# Patient Record
Sex: Male | Born: 1960
Health system: Southern US, Community
[De-identification: ages and names within clinical notes are randomized; demographics above are authoritative.]

## PROBLEM LIST (undated history)

## (undated) DIAGNOSIS — R7301 Impaired fasting glucose: Secondary | ICD-10-CM

## (undated) DIAGNOSIS — E119 Type 2 diabetes mellitus without complications: Secondary | ICD-10-CM

## (undated) DIAGNOSIS — N400 Enlarged prostate without lower urinary tract symptoms: Secondary | ICD-10-CM

## (undated) DIAGNOSIS — E559 Vitamin D deficiency, unspecified: Secondary | ICD-10-CM

## (undated) DIAGNOSIS — M109 Gout, unspecified: Secondary | ICD-10-CM

## (undated) DIAGNOSIS — E785 Hyperlipidemia, unspecified: Secondary | ICD-10-CM

## (undated) DIAGNOSIS — I10 Essential (primary) hypertension: Secondary | ICD-10-CM

## (undated) HISTORY — DX: Essential (primary) hypertension: I10

## (undated) HISTORY — DX: Hyperlipidemia, unspecified: E78.5

## (undated) HISTORY — DX: Vitamin D deficiency, unspecified: E55.9

## (undated) HISTORY — DX: Benign prostatic hyperplasia without lower urinary tract symptoms: N40.0

## (undated) HISTORY — DX: Impaired fasting glucose: R73.01

---

## 2004-01-26 ENCOUNTER — Ambulatory Visit: Payer: Self-pay | Admitting: *Deleted

## 2014-10-08 LAB — PSA: PSA: 1.4

## 2014-10-08 LAB — HEPATIC FUNCTION PANEL
ALK PHOS: 78 U/L (ref 25–125)
ALT: 13 U/L (ref 10–40)
AST: 17 U/L (ref 14–40)
Bilirubin, Total: 0.4 mg/dL

## 2014-10-08 LAB — BASIC METABOLIC PANEL
BUN: 22 mg/dL — AB (ref 4–21)
Creatinine: 1.6 mg/dL — AB (ref 0.6–1.3)
GLUCOSE: 124 mg/dL
POTASSIUM: 4.1 mmol/L (ref 3.4–5.3)
Sodium: 144 mmol/L (ref 137–147)

## 2014-10-08 LAB — LIPID PANEL
CHOLESTEROL: 264 mg/dL — AB (ref 0–200)
HDL: 43 mg/dL (ref 35–70)
LDL CALC: 199 mg/dL
Triglycerides: 112 mg/dL (ref 40–160)

## 2014-10-08 LAB — HEMOGLOBIN A1C: Hgb A1c MFr Bld: 6.1 % — AB (ref 4.0–6.0)

## 2014-10-08 LAB — TSH: TSH: 1.05 u[IU]/mL (ref 0.41–5.90)

## 2014-11-05 ENCOUNTER — Encounter: Payer: Self-pay | Admitting: Family Medicine

## 2014-11-05 ENCOUNTER — Ambulatory Visit (INDEPENDENT_AMBULATORY_CARE_PROVIDER_SITE_OTHER): Payer: BLUE CROSS/BLUE SHIELD | Admitting: Family Medicine

## 2014-11-05 VITALS — BP 174/89 | HR 87 | Temp 98.5°F | Ht 68.4 in | Wt 201.0 lb

## 2014-11-05 DIAGNOSIS — N183 Chronic kidney disease, stage 3 unspecified: Secondary | ICD-10-CM | POA: Insufficient documentation

## 2014-11-05 DIAGNOSIS — E785 Hyperlipidemia, unspecified: Secondary | ICD-10-CM | POA: Insufficient documentation

## 2014-11-05 DIAGNOSIS — E559 Vitamin D deficiency, unspecified: Secondary | ICD-10-CM

## 2014-11-05 DIAGNOSIS — I1310 Hypertensive heart and chronic kidney disease without heart failure, with stage 1 through stage 4 chronic kidney disease, or unspecified chronic kidney disease: Secondary | ICD-10-CM | POA: Insufficient documentation

## 2014-11-05 DIAGNOSIS — I129 Hypertensive chronic kidney disease with stage 1 through stage 4 chronic kidney disease, or unspecified chronic kidney disease: Secondary | ICD-10-CM

## 2014-11-05 DIAGNOSIS — E1121 Type 2 diabetes mellitus with diabetic nephropathy: Secondary | ICD-10-CM | POA: Insufficient documentation

## 2014-11-05 DIAGNOSIS — R7301 Impaired fasting glucose: Secondary | ICD-10-CM

## 2014-11-05 DIAGNOSIS — N400 Enlarged prostate without lower urinary tract symptoms: Secondary | ICD-10-CM | POA: Insufficient documentation

## 2014-11-05 MED ORDER — LISINOPRIL 20 MG PO TABS
20.0000 mg | ORAL_TABLET | Freq: Every day | ORAL | Status: DC
Start: 2014-11-05 — End: 2015-08-13

## 2014-11-05 NOTE — Assessment & Plan Note (Signed)
Just started on vit D 5000 IU, will check next visit.

## 2014-11-05 NOTE — Assessment & Plan Note (Signed)
Poorly controlled. Not on a ACE/ARB. Will start him on lisinopril 20mg . Recheck 2 weeks, goal of stopping beta-blocker if well controlled as he is having ED

## 2014-11-05 NOTE — Progress Notes (Signed)
BP 174/89 mmHg  Pulse 87  Temp(Src) 98.5 F (36.9 C)  Ht 5' 8.4" (1.737 m)  Wt 201 lb (91.173 kg)  BMI 30.22 kg/m2  SpO2 99%   Subjective:    Patient ID: Anthony Ouch., male    DOB: [birth year], 54 y.o.   MRN: [id number]  HPI: Anthony Sellers. is a 54 y.o. male who presents today to establish care and for evaluation of the following  Chief Complaint  Patient presents with  . Hypertension   HYPERTENSION- went to see NP at work this morning, still high this morning, but better Hypertension status: uncontrolled  Satisfied with current treatment? no Duration of hypertension: about 2 weeks BP monitoring frequency:  weekly BP range: 140s-170s BP medication side effects:  yes- ED Medication compliance: excellent compliance Previous BP meds:none, amlodipine, bisoprolol (bystolic) and HCTZ Aspirin: no Recurrent headaches: no Visual changes: yes Palpitations: no Dyspnea: no Chest pain: no Lower extremity edema: no Dizzy/lightheaded: no  HYPERLIPIDEMIA Hyperlipidemia status: excellent control Satisfied with current treatment?  yes Side effects:  no Medication compliance: excellent compliance Past cholesterol meds: none Supplements: none Aspirin:  no Chest pain:  no Coronary artery disease:  no Family history CAD:  no Family history early CAD:  no  Relevant past medical, surgical, family and social history reviewed and updated as indicated. Interim medical history since our last visit reviewed. Allergies and medications reviewed and updated.  Review of Systems  Constitutional: Negative.   Respiratory: Negative.   Cardiovascular: Negative.   Musculoskeletal: Negative.   Psychiatric/Behavioral: Negative.     Per HPI unless specifically indicated above     Objective:    BP 174/89 mmHg  Pulse 87  Temp(Src) 98.5 F (36.9 C)  Ht 5' 8.4" (1.737 m)  Wt 201 lb (91.173 kg)  BMI 30.22 kg/m2  SpO2 99%  Wt Readings from Last 3 Encounters:  11/05/14 201 lb  (91.173 kg)    Physical Exam  Constitutional: He is oriented to person, place, and time. He appears well-developed and well-nourished. No distress.  HENT:  Head: Normocephalic and atraumatic.  Right Ear: Hearing and external ear normal.  Left Ear: Hearing and external ear normal.  Nose: Nose normal.  Mouth/Throat: Oropharynx is clear and moist. No oropharyngeal exudate.  Eyes: Conjunctivae, EOM and lids are normal. Pupils are equal, round, and reactive to light. Right eye exhibits no discharge. Left eye exhibits no discharge. No scleral icterus.  Neck: Normal range of motion. Neck supple. No JVD present. No tracheal deviation present. No thyromegaly present.  Cardiovascular: Normal rate, regular rhythm, normal heart sounds and intact distal pulses.  Exam reveals no gallop and no friction rub.   No murmur heard. Pulmonary/Chest: Effort normal and breath sounds normal. No stridor. No respiratory distress. He has no wheezes. He has no rales. He exhibits no tenderness.  Musculoskeletal: Normal range of motion.  Lymphadenopathy:    He has no cervical adenopathy.  Neurological: He is alert and oriented to person, place, and time.  Skin: Skin is warm, dry and intact. No rash noted. No erythema. No pallor.  Psychiatric: He has a normal mood and affect. His speech is normal and behavior is normal. Judgment and thought content normal. Cognition and memory are normal.  Nursing note and vitals reviewed.   Results for orders placed or performed in visit on 48/18/56  Basic metabolic panel  Result Value Ref Range   Glucose 124 mg/dL   BUN 22 (A) 4 - 21 mg/dL  Creatinine 1.6 (A) 0.6 - 1.3 mg/dL   Potassium 4.1 3.4 - 5.3 mmol/L   Sodium 144 137 - 147 mmol/L  Lipid panel  Result Value Ref Range   Triglycerides 112 40 - 160 mg/dL   Cholesterol 264 (A) 0 - 200 mg/dL   HDL 43 35 - 70 mg/dL   LDL Cholesterol 199 mg/dL  Hepatic function panel  Result Value Ref Range   Alkaline Phosphatase 78 25 -  125 U/L   ALT 13 10 - 40 U/L   AST 17 14 - 40 U/L   Bilirubin, Total 0.4 mg/dL  Hemoglobin A1c  Result Value Ref Range   Hgb A1c MFr Bld 6.1 (A) 4.0 - 6.0 %  PSA  Result Value Ref Range   PSA 1.4   TSH  Result Value Ref Range   TSH 1.05 0.41 - 5.90 uIU/mL      Assessment & Plan:   Problem List Items Addressed This Visit      Endocrine   IFG (impaired fasting glucose)    A1c 6.1. Continue diet and exercise. Recheck 5 months.       Relevant Orders   Comprehensive metabolic panel     Genitourinary   Benign hypertensive renal disease - Primary    Poorly controlled. Not on a ACE/ARB. Will start him on lisinopril 20mg . Recheck 2 weeks, goal of stopping beta-blocker if well controlled as he is having ED      Relevant Orders   Comprehensive metabolic panel   Microalbumin, Urine Waived     Other   Hyperlipidemia    Started on atorvastatin 2 weeks ago. Will recheck levels next visit.       Relevant Medications   amLODipine-atorvastatin (CADUET) 5-40 MG tablet   bisoprolol (ZEBETA) 5 MG tablet   hydrochlorothiazide (HYDRODIURIL) 25 MG tablet   lisinopril (PRINIVIL,ZESTRIL) 20 MG tablet   Other Relevant Orders   Comprehensive metabolic panel   Lipid Panel Piccolo, Waived   Vitamin D deficiency    Just started on vit D 5000 IU, will check next visit.           Follow up plan: Return in about 2 weeks (around 11/19/2014).

## 2014-11-05 NOTE — Patient Instructions (Signed)
Start the new blood pressure medicine (lisinopril) If you get dizzy, stop the bisoprolol. Keep taking everything else.   Find out when your shots were.

## 2014-11-05 NOTE — Assessment & Plan Note (Signed)
A1c 6.1. Continue diet and exercise. Recheck 5 months.

## 2014-11-05 NOTE — Assessment & Plan Note (Signed)
Started on atorvastatin 2 weeks ago. Will recheck levels next visit.

## 2014-11-19 ENCOUNTER — Ambulatory Visit (INDEPENDENT_AMBULATORY_CARE_PROVIDER_SITE_OTHER): Payer: BLUE CROSS/BLUE SHIELD | Admitting: Family Medicine

## 2014-11-19 ENCOUNTER — Encounter: Payer: Self-pay | Admitting: Family Medicine

## 2014-11-19 DIAGNOSIS — E785 Hyperlipidemia, unspecified: Secondary | ICD-10-CM

## 2014-11-19 DIAGNOSIS — R7301 Impaired fasting glucose: Secondary | ICD-10-CM

## 2014-11-19 DIAGNOSIS — I129 Hypertensive chronic kidney disease with stage 1 through stage 4 chronic kidney disease, or unspecified chronic kidney disease: Secondary | ICD-10-CM

## 2014-11-19 LAB — LIPID PANEL PICCOLO, WAIVED
CHOLESTEROL PICCOLO, WAIVED: 101 mg/dL (ref <200)
Chol/HDL Ratio Piccolo,Waive: 3.2 mg/dL
HDL CHOL PICCOLO, WAIVED: 31 mg/dL — AB (ref >59)
LDL Chol Calc Piccolo Waived: 58 mg/dL (ref <100)
Triglycerides Piccolo,Waived: 61 mg/dL (ref <150)
VLDL Chol Calc Piccolo,Waive: 12 mg/dL (ref <30)

## 2014-11-19 LAB — MICROALBUMIN, URINE WAIVED
CREATININE, URINE WAIVED: 300 mg/dL (ref 10–300)
Microalb, Ur Waived: 150 mg/L — ABNORMAL HIGH (ref 0–19)

## 2014-11-19 NOTE — Progress Notes (Signed)
BP 170/97 mmHg  Pulse 80  Temp(Src) 99.1 F (37.3 C)  Ht 5\' 9"  (1.753 m)  Wt 201 lb 3.2 oz (91.264 kg)  BMI 29.70 kg/m2  SpO2 98%   Subjective:    Patient ID: Anthony Ouch., male    DOB: 03-Jun-1960, 54 y.o.   MRN: [id number]  HPI: Anthony Godlewski. is a 54 y.o. male  Chief Complaint  Patient presents with  . Hyperlipidemia  . Hypertension   HYPERTENSION Hypertension status: uncontrolled  Satisfied with current treatment? no Duration of hypertension: chronic BP monitoring frequency:  daily BP range: 145 is the lowest BP medication side effects:  no Medication compliance: excellent compliance Aspirin: no Recurrent headaches: no Visual changes: no Palpitations: no Dyspnea: no Chest pain: no Lower extremity edema: no Dizzy/lightheaded: no  Relevant past medical, surgical, family and social history reviewed and updated as indicated. Interim medical history since our last visit reviewed. Allergies and medications reviewed and updated.  Review of Systems  Constitutional: Negative.   Respiratory: Negative.   Cardiovascular: Negative.   Psychiatric/Behavioral: Negative.     Per HPI unless specifically indicated above     Objective:    BP 170/97 mmHg  Pulse 80  Temp(Src) 99.1 F (37.3 C)  Ht 5\' 9"  (1.753 m)  Wt 201 lb 3.2 oz (91.264 kg)  BMI 29.70 kg/m2  SpO2 98%  Wt Readings from Last 3 Encounters:  11/19/14 201 lb 3.2 oz (91.264 kg)  11/05/14 201 lb (91.173 kg)    Physical Exam  Constitutional: He is oriented to person, place, and time. He appears well-developed and well-nourished. No distress.  HENT:  Head: Normocephalic and atraumatic.  Right Ear: Hearing normal.  Left Ear: Hearing normal.  Nose: Nose normal.  Eyes: Conjunctivae and lids are normal. Right eye exhibits no discharge. Left eye exhibits no discharge. No scleral icterus.  Cardiovascular: Normal rate, regular rhythm, normal heart sounds and intact distal pulses.  Exam reveals no  gallop and no friction rub.   No murmur heard. Pulmonary/Chest: Effort normal and breath sounds normal. No respiratory distress. He has no wheezes. He has no rales. He exhibits no tenderness.  Musculoskeletal: Normal range of motion.  Neurological: He is alert and oriented to person, place, and time.  Skin: Skin is warm, dry and intact. No rash noted. No erythema. No pallor.  Psychiatric: He has a normal mood and affect. His speech is normal and behavior is normal. Judgment and thought content normal. Cognition and memory are normal.  Nursing note and vitals reviewed.   Results for orders placed or performed in visit on 92/33/00  Basic metabolic panel  Result Value Ref Range   Glucose 124 mg/dL   BUN 22 (A) 4 - 21 mg/dL   Creatinine 1.6 (A) 0.6 - 1.3 mg/dL   Potassium 4.1 3.4 - 5.3 mmol/L   Sodium 144 137 - 147 mmol/L  Lipid panel  Result Value Ref Range   Triglycerides 112 40 - 160 mg/dL   Cholesterol 264 (A) 0 - 200 mg/dL   HDL 43 35 - 70 mg/dL   LDL Cholesterol 199 mg/dL  Hepatic function panel  Result Value Ref Range   Alkaline Phosphatase 78 25 - 125 U/L   ALT 13 10 - 40 U/L   AST 17 14 - 40 U/L   Bilirubin, Total 0.4 mg/dL  Hemoglobin A1c  Result Value Ref Range   Hgb A1c MFr Bld 6.1 (A) 4.0 - 6.0 %  PSA  Result  Value Ref Range   PSA 1.4   TSH  Result Value Ref Range   TSH 1.05 0.41 - 5.90 uIU/mL      Assessment & Plan:   Problem List Items Addressed This Visit      Endocrine   IFG (impaired fasting glucose)    Checking CMP today. Await results.         Genitourinary   Benign hypertensive renal disease    Not under good control. Will increase his lisinopril to 40mg  daily and recheck in 1 month with physical.         Other   Hyperlipidemia    Under great control. Continue current regimen. Continue to monitor.           Follow up plan: Return in about 4 weeks (around 12/17/2014) for Physical.

## 2014-11-19 NOTE — Assessment & Plan Note (Signed)
Under great control. Continue current regimen. Continue to monitor.

## 2014-11-19 NOTE — Patient Instructions (Addendum)
Continue the atorvastatin-amlodipine for your cholesterol 1 in the morning and 1 in the evening Continue the hydrochlorothiazide for your blood pressure Increase your lisinopril from 20mg  to 40mg  (2 pills at the same time) Follow up in 3-4 weeks

## 2014-11-19 NOTE — Assessment & Plan Note (Signed)
Checking CMP today. Await results.  

## 2014-11-19 NOTE — Assessment & Plan Note (Signed)
Not under good control. Will increase his lisinopril to 40mg  daily and recheck in 1 month with physical.

## 2014-11-20 ENCOUNTER — Encounter: Payer: Self-pay | Admitting: Family Medicine

## 2014-11-20 LAB — COMPREHENSIVE METABOLIC PANEL
ALBUMIN: 3.9 g/dL (ref 3.5–5.5)
ALT: 11 IU/L (ref 0–44)
AST: 14 IU/L (ref 0–40)
Albumin/Globulin Ratio: 1.6 (ref 1.1–2.5)
Alkaline Phosphatase: 74 IU/L (ref 39–117)
BUN/Creatinine Ratio: 11 (ref 9–20)
BUN: 15 mg/dL (ref 6–24)
Bilirubin Total: 0.4 mg/dL (ref 0.0–1.2)
CHLORIDE: 101 mmol/L (ref 97–106)
CO2: 26 mmol/L (ref 18–29)
CREATININE: 1.36 mg/dL — AB (ref 0.76–1.27)
Calcium: 8.8 mg/dL (ref 8.7–10.2)
GFR calc non Af Amer: 59 mL/min/{1.73_m2} — ABNORMAL LOW (ref >59)
GFR, EST AFRICAN AMERICAN: 68 mL/min/{1.73_m2} (ref >59)
GLOBULIN, TOTAL: 2.5 g/dL (ref 1.5–4.5)
GLUCOSE: 132 mg/dL — AB (ref 65–99)
Potassium: 3.8 mmol/L (ref 3.5–5.2)
Sodium: 141 mmol/L (ref 136–144)
TOTAL PROTEIN: 6.4 g/dL (ref 6.0–8.5)

## 2014-12-22 ENCOUNTER — Encounter: Payer: BLUE CROSS/BLUE SHIELD | Admitting: Family Medicine

## 2015-08-13 ENCOUNTER — Encounter: Payer: Self-pay | Admitting: Unknown Physician Specialty

## 2015-08-13 ENCOUNTER — Telehealth: Payer: Self-pay | Admitting: Unknown Physician Specialty

## 2015-08-13 ENCOUNTER — Ambulatory Visit (INDEPENDENT_AMBULATORY_CARE_PROVIDER_SITE_OTHER): Payer: BLUE CROSS/BLUE SHIELD | Admitting: Unknown Physician Specialty

## 2015-08-13 VITALS — BP 202/136 | HR 87 | Temp 98.2°F | Wt 212.8 lb

## 2015-08-13 DIAGNOSIS — E785 Hyperlipidemia, unspecified: Secondary | ICD-10-CM | POA: Diagnosis not present

## 2015-08-13 DIAGNOSIS — I129 Hypertensive chronic kidney disease with stage 1 through stage 4 chronic kidney disease, or unspecified chronic kidney disease: Secondary | ICD-10-CM | POA: Diagnosis not present

## 2015-08-13 MED ORDER — AMLODIPINE-ATORVASTATIN 5-40 MG PO TABS: 1.0000 | ORAL_TABLET | Freq: Every day | ORAL | Status: DC

## 2015-08-13 MED ORDER — HYDROCHLOROTHIAZIDE 25 MG PO TABS: 25.0000 mg | ORAL_TABLET | Freq: Every day | ORAL | Status: DC

## 2015-08-13 NOTE — Assessment & Plan Note (Signed)
LDL was 199 before Atorvastatin and below 70 after. Will restart Caduet

## 2015-08-13 NOTE — Telephone Encounter (Signed)
Sending him here for BP management with a SBP over 200

## 2015-08-13 NOTE — Assessment & Plan Note (Signed)
Restart HCTZ and Caduet

## 2015-08-13 NOTE — Progress Notes (Signed)
BP 202/136 mmHg  Pulse 87  Temp(Src) 98.2 F (36.8 C)  Wt 212 lb 12.8 oz (96.525 kg)  SpO2 98%   Subjective:    Patient ID: Anthony Ouch., male    DOB: [birth year], 55 y.o.   MRN: [id number]  HPI: Anthony Sellers. is a 55 y.o. male  Chief Complaint  Patient presents with  . Hypertension    pt states he had to go to the eye doc, had a blood vessel bust in eye, BP was extremely high, so they told him to get checked out    He was on a number of medications with Dr. Wynetta Emery.  Saw a nurse at work who took him off most of his medications.  No headache, chest pain, dizzyness, or mental status changes.  He is being treated for retinal bleeding.   Relevant past medical, surgical, family and social history reviewed and updated as indicated. Interim medical history since our last visit reviewed. Allergies and medications reviewed and updated.  Review of Systems  Per HPI unless specifically indicated above     Objective:    BP 202/136 mmHg  Pulse 87  Temp(Src) 98.2 F (36.8 C)  Wt 212 lb 12.8 oz (96.525 kg)  SpO2 98%  Wt Readings from Last 3 Encounters:  08/13/15 212 lb 12.8 oz (96.525 kg)  11/19/14 201 lb 3.2 oz (91.264 kg)  11/05/14 201 lb (91.173 kg)    Physical Exam  Constitutional: He is oriented to person, place, and time. He appears well-developed and well-nourished. No distress.  HENT:  Head: Normocephalic and atraumatic.  Eyes: Conjunctivae and lids are normal. Right eye exhibits no discharge. Left eye exhibits no discharge. No scleral icterus.  Neck: Normal range of motion. Neck supple. No JVD present. Carotid bruit is not present.  Cardiovascular: Normal rate, regular rhythm and normal heart sounds.   Pulmonary/Chest: Effort normal and breath sounds normal. No respiratory distress.  Abdominal: Normal appearance. There is no splenomegaly or hepatomegaly.  Musculoskeletal: Normal range of motion.  Neurological: He is alert and oriented to person, place, and  time.  Skin: Skin is warm, dry and intact. No rash noted. No pallor.  Psychiatric: He has a normal mood and affect. His behavior is normal. Judgment and thought content normal.    Results for orders placed or performed in visit on 11/19/14  Comprehensive metabolic panel  Result Value Ref Range   Glucose 132 (H) 65 - 99 mg/dL   BUN 15 6 - 24 mg/dL   Creatinine, Ser 1.36 (H) 0.76 - 1.27 mg/dL   GFR calc non Af Amer 59 (L) >59 mL/min/1.73   GFR calc Af Amer 68 >59 mL/min/1.73   BUN/Creatinine Ratio 11 9 - 20   Sodium 141 136 - 144 mmol/L   Potassium 3.8 3.5 - 5.2 mmol/L   Chloride 101 97 - 106 mmol/L   CO2 26 18 - 29 mmol/L   Calcium 8.8 8.7 - 10.2 mg/dL   Total Protein 6.4 6.0 - 8.5 g/dL   Albumin 3.9 3.5 - 5.5 g/dL   Globulin, Total 2.5 1.5 - 4.5 g/dL   Albumin/Globulin Ratio 1.6 1.1 - 2.5   Bilirubin Total 0.4 0.0 - 1.2 mg/dL   Alkaline Phosphatase 74 39 - 117 IU/L   AST 14 0 - 40 IU/L   ALT 11 0 - 44 IU/L  Lipid Panel Piccolo, Waived  Result Value Ref Range   Cholesterol Piccolo, Waived 101 <200 mg/dL   HDL Chol  Piccolo, Waived 31 (L) >59 mg/dL   Triglycerides Piccolo,Waived 61 <150 mg/dL   Chol/HDL Ratio Piccolo,Waive 3.2 mg/dL   LDL Chol Calc Piccolo Waived 58 <100 mg/dL   VLDL Chol Calc Piccolo,Waive 12 <30 mg/dL  Microalbumin, Urine Waived  Result Value Ref Range   Microalb, Ur Waived 150 (H) 0 - 19 mg/L   Creatinine, Urine Waived 300 10 - 300 mg/dL   Microalb/Creat Ratio 30-300 (H) <30 mg/g      Assessment & Plan:   Problem List Items Addressed This Visit      Unprioritized   Benign hypertensive renal disease    Restart HCTZ and Caduet       Hyperlipidemia - Primary    LDL was 199 before Atorvastatin and below 70 after. Will restart Caduet      Relevant Medications   lisinopril (PRINIVIL,ZESTRIL) 40 MG tablet   hydrochlorothiazide (HYDRODIURIL) 25 MG tablet   amLODipine-atorvastatin (CADUET) 5-40 MG tablet       Follow up plan: Return for next  week. Will need labs at that time

## 2015-08-20 ENCOUNTER — Encounter: Payer: Self-pay | Admitting: Unknown Physician Specialty

## 2015-08-20 ENCOUNTER — Ambulatory Visit (INDEPENDENT_AMBULATORY_CARE_PROVIDER_SITE_OTHER): Payer: BLUE CROSS/BLUE SHIELD | Admitting: Unknown Physician Specialty

## 2015-08-20 DIAGNOSIS — G4730 Sleep apnea, unspecified: Secondary | ICD-10-CM | POA: Diagnosis not present

## 2015-08-20 DIAGNOSIS — I129 Hypertensive chronic kidney disease with stage 1 through stage 4 chronic kidney disease, or unspecified chronic kidney disease: Secondary | ICD-10-CM

## 2015-08-20 MED ORDER — METOPROLOL SUCCINATE ER 50 MG PO TB24
50.0000 mg | ORAL_TABLET | Freq: Every day | ORAL | 1 refills | Status: DC
Start: 2015-08-20 — End: 2015-09-06

## 2015-08-20 MED ORDER — CHLORTHALIDONE 25 MG PO TABS: 25.0000 mg | ORAL_TABLET | Freq: Every day | ORAL | 1 refills | Status: DC

## 2015-08-20 MED ORDER — AMLODIPINE-ATORVASTATIN 5-40 MG PO TABS: 1.0000 | ORAL_TABLET | Freq: Two times a day (BID) | ORAL | 1 refills | Status: DC

## 2015-08-20 NOTE — Assessment & Plan Note (Signed)
Snores at night and falls asleep during the day

## 2015-08-20 NOTE — Progress Notes (Signed)
BP (!) 199/114 (BP Location: Left Arm, Patient Position: Sitting, Cuff Size: Large)   Pulse (!) 101   Temp 98.3 F (36.8 C)   Wt 215 lb (97.5 kg)   SpO2 96%   BMI 31.75 kg/m    Subjective:    Patient ID: Anthony Ouch., male    DOB: September 04, 1960, 55 y.o.   MRN: [id number]  HPI: Anthony Sellers. is a 55 y.o. male  Chief Complaint  Patient presents with  . Hypertension   Hypertension Stopped drinking about 1 week ago.  Wife states he snores loudly.  States when he first medication he was taking all three of them twice a day and it was coming down.  However, it has been high and running 180-190's/ 100.  He does fall asleep at the end of the day.   Using medications without difficulty Average home BPs   No problems or lightheadedness No chest pain with exertion or shortness of breath No Edema  Relevant past medical, surgical, family and social history reviewed and updated as indicated. Interim medical history since our last visit reviewed. Allergies and medications reviewed and updated.  Review of Systems  Per HPI unless specifically indicated above     Objective:    BP (!) 199/114 (BP Location: Left Arm, Patient Position: Sitting, Cuff Size: Large)   Pulse (!) 101   Temp 98.3 F (36.8 C)   Wt 215 lb (97.5 kg)   SpO2 96%   BMI 31.75 kg/m   Wt Readings from Last 3 Encounters:  08/20/15 215 lb (97.5 kg)  08/13/15 212 lb 12.8 oz (96.5 kg)  11/19/14 201 lb 3.2 oz (91.3 kg)    Physical Exam  Constitutional: He is oriented to person, place, and time. He appears well-developed and well-nourished. No distress.  HENT:  Head: Normocephalic and atraumatic.  Eyes: Conjunctivae and lids are normal. Right eye exhibits no discharge. Left eye exhibits no discharge. No scleral icterus.  Neck: Normal range of motion. Neck supple. No JVD present. Carotid bruit is not present.  Cardiovascular: Normal rate, regular rhythm and normal heart sounds.   Pulmonary/Chest: Effort  normal and breath sounds normal. No respiratory distress.  Abdominal: Normal appearance. There is no splenomegaly or hepatomegaly.  Musculoskeletal: Normal range of motion.  Neurological: He is alert and oriented to person, place, and time.  Skin: Skin is warm, dry and intact. No rash noted. No pallor.  Psychiatric: He has a normal mood and affect. His behavior is normal. Judgment and thought content normal.    Results for orders placed or performed in visit on 11/19/14  Comprehensive metabolic panel  Result Value Ref Range   Glucose 132 (H) 65 - 99 mg/dL   BUN 15 6 - 24 mg/dL   Creatinine, Ser 1.36 (H) 0.76 - 1.27 mg/dL   GFR calc non Af Amer 59 (L) >59 mL/min/1.73   GFR calc Af Amer 68 >59 mL/min/1.73   BUN/Creatinine Ratio 11 9 - 20   Sodium 141 136 - 144 mmol/L   Potassium 3.8 3.5 - 5.2 mmol/L   Chloride 101 97 - 106 mmol/L   CO2 26 18 - 29 mmol/L   Calcium 8.8 8.7 - 10.2 mg/dL   Total Protein 6.4 6.0 - 8.5 g/dL   Albumin 3.9 3.5 - 5.5 g/dL   Globulin, Total 2.5 1.5 - 4.5 g/dL   Albumin/Globulin Ratio 1.6 1.1 - 2.5   Bilirubin Total 0.4 0.0 - 1.2 mg/dL   Alkaline Phosphatase  74 39 - 117 IU/L   AST 14 0 - 40 IU/L   ALT 11 0 - 44 IU/L  Lipid Panel Piccolo, Waived  Result Value Ref Range   Cholesterol Piccolo, Waived 101 <200 mg/dL   HDL Chol Piccolo, Waived 31 (L) >59 mg/dL   Triglycerides Piccolo,Waived 61 <150 mg/dL   Chol/HDL Ratio Piccolo,Waive 3.2 mg/dL   LDL Chol Calc Piccolo Waived 58 <100 mg/dL   VLDL Chol Calc Piccolo,Waive 12 <30 mg/dL  Microalbumin, Urine Waived  Result Value Ref Range   Microalb, Ur Waived 150 (H) 0 - 19 mg/L   Creatinine, Urine Waived 300 10 - 300 mg/dL   Microalb/Creat Ratio 30-300 (H) <30 mg/g      Assessment & Plan:   Problem List Items Addressed This Visit      Unprioritized   Benign hypertensive renal disease    Check CMP.  Will change HCTZ to Chlorthalidone.  Add Toprol with a pulse of 101.  Increase Caduet to BID and next  script double dose.  Order sleep study.  Encouraged to stay off ETOH      Relevant Medications   chlorthalidone (HYGROTON) 25 MG tablet   amLODipine-atorvastatin (CADUET) 5-40 MG tablet   metoprolol succinate (TOPROL-XL) 50 MG 24 hr tablet   Other Relevant Orders   Comprehensive metabolic panel   CBC with Differential/Platelet   TSH   Sleep apnea    Snores at night and falls asleep during the day      Relevant Orders   Ambulatory referral to Sleep Studies    Other Visit Diagnoses   None.      Follow up plan: Return in about 1 week (around 08/27/2015).

## 2015-08-20 NOTE — Patient Instructions (Addendum)
Change HCTZ to Chlorthalidone (OK to throw away HCTZ)  Add Metoprolol 50 mg  Increase Caduet to twice a day.

## 2015-08-20 NOTE — Assessment & Plan Note (Addendum)
Check CMP.  Will change HCTZ to Chlorthalidone.  Add Toprol with a pulse of 101.  Increase Caduet to BID and next script double dose.  Order sleep study.  Encouraged to stay off ETOH

## 2015-08-21 LAB — CBC WITH DIFFERENTIAL/PLATELET
BASOS: 0 %
Basophils Absolute: 0 10*3/uL (ref 0.0–0.2)
EOS (ABSOLUTE): 0.3 10*3/uL (ref 0.0–0.4)
EOS: 3 %
HEMATOCRIT: 41.6 % (ref 37.5–51.0)
HEMOGLOBIN: 13.7 g/dL (ref 12.6–17.7)
IMMATURE GRANS (ABS): 0 10*3/uL (ref 0.0–0.1)
Immature Granulocytes: 0 %
LYMPHS ABS: 2 10*3/uL (ref 0.7–3.1)
Lymphs: 20 %
MCH: 28.5 pg (ref 26.6–33.0)
MCHC: 32.9 g/dL (ref 31.5–35.7)
MCV: 87 fL (ref 79–97)
MONOCYTES: 8 %
Monocytes Absolute: 0.8 10*3/uL (ref 0.1–0.9)
NEUTROS ABS: 6.8 10*3/uL (ref 1.4–7.0)
Neutrophils: 69 %
Platelets: 202 10*3/uL (ref 150–379)
RBC: 4.8 x10E6/uL (ref 4.14–5.80)
RDW: 14.1 % (ref 12.3–15.4)
WBC: 9.8 10*3/uL (ref 3.4–10.8)

## 2015-08-21 LAB — COMPREHENSIVE METABOLIC PANEL
ALBUMIN: 4.2 g/dL (ref 3.5–5.5)
ALT: 13 IU/L (ref 0–44)
AST: 18 IU/L (ref 0–40)
Albumin/Globulin Ratio: 1.6 (ref 1.2–2.2)
Alkaline Phosphatase: 73 IU/L (ref 39–117)
BILIRUBIN TOTAL: 0.3 mg/dL (ref 0.0–1.2)
BUN / CREAT RATIO: 13 (ref 9–20)
BUN: 24 mg/dL (ref 6–24)
CALCIUM: 9 mg/dL (ref 8.7–10.2)
CHLORIDE: 96 mmol/L (ref 96–106)
CO2: 24 mmol/L (ref 18–29)
CREATININE: 1.8 mg/dL — AB (ref 0.76–1.27)
GFR, EST AFRICAN AMERICAN: 49 mL/min/{1.73_m2} — AB (ref >59)
GFR, EST NON AFRICAN AMERICAN: 42 mL/min/{1.73_m2} — AB (ref >59)
GLUCOSE: 138 mg/dL — AB (ref 65–99)
Globulin, Total: 2.6 g/dL (ref 1.5–4.5)
Potassium: 3.7 mmol/L (ref 3.5–5.2)
Sodium: 139 mmol/L (ref 134–144)
TOTAL PROTEIN: 6.8 g/dL (ref 6.0–8.5)

## 2015-08-21 LAB — TSH: TSH: 1.01 u[IU]/mL (ref 0.450–4.500)

## 2015-08-23 ENCOUNTER — Telehealth: Payer: Self-pay | Admitting: Unknown Physician Specialty

## 2015-08-23 DIAGNOSIS — N183 Chronic kidney disease, stage 3 unspecified: Secondary | ICD-10-CM

## 2015-08-23 NOTE — Telephone Encounter (Signed)
Talked to wife about labs.  Refer to nephrology due to CKD

## 2015-08-27 ENCOUNTER — Encounter: Payer: Self-pay | Admitting: Unknown Physician Specialty

## 2015-08-27 ENCOUNTER — Ambulatory Visit (INDEPENDENT_AMBULATORY_CARE_PROVIDER_SITE_OTHER): Payer: BLUE CROSS/BLUE SHIELD | Admitting: Unknown Physician Specialty

## 2015-08-27 DIAGNOSIS — E785 Hyperlipidemia, unspecified: Secondary | ICD-10-CM | POA: Diagnosis not present

## 2015-08-27 DIAGNOSIS — N183 Chronic kidney disease, stage 3 unspecified: Secondary | ICD-10-CM

## 2015-08-27 DIAGNOSIS — I129 Hypertensive chronic kidney disease with stage 1 through stage 4 chronic kidney disease, or unspecified chronic kidney disease: Secondary | ICD-10-CM | POA: Diagnosis not present

## 2015-08-27 NOTE — Assessment & Plan Note (Addendum)
Discussed pt with Dr. Wynetta Emery.  Needs to see nephrology.  Encourageed to continue staying away from ETOH.  Will ask nephrology to adjust medication.

## 2015-08-27 NOTE — Assessment & Plan Note (Signed)
Nephrology pending.

## 2015-08-27 NOTE — Progress Notes (Signed)
BP (!) 163/100 (BP Location: Left Arm, Cuff Size: Large)   Pulse 68   Temp 98.2 F (36.8 C)   Wt 218 lb (98.9 kg)   SpO2 98%   BMI 32.19 kg/m    Subjective:    Patient ID: Anthony Ouch., male    DOB: Sep 12, 1960, 55 y.o.   MRN: [id number]  HPI: Anthony Villar. is a 55 y.o. male  Chief Complaint  Patient presents with  . Hypertension    1 week f/up   Pt is here to f/u his BP.  He is taking his BP medications.  BP at home is around 170/93-94.  It is higher in the AM.   He takes the Caduet in the AM.  Noted stage 3 CKD with last labs.  Appt with Nephrology pending at the end of this month.  Sleep study ordered but no follow through has been done.    He was drinking a lot of ETOH but nothing since high blood pressure.    Relevant past medical, surgical, family and social history reviewed and updated as indicated. Interim medical history since our last visit reviewed. Allergies and medications reviewed and updated.  Review of Systems  Per HPI unless specifically indicated above     Objective:    BP (!) 163/100 (BP Location: Left Arm, Cuff Size: Large)   Pulse 68   Temp 98.2 F (36.8 C)   Wt 218 lb (98.9 kg)   SpO2 98%   BMI 32.19 kg/m   Wt Readings from Last 3 Encounters:  08/27/15 218 lb (98.9 kg)  08/20/15 215 lb (97.5 kg)  08/13/15 212 lb 12.8 oz (96.5 kg)    Physical Exam  Constitutional: He is oriented to person, place, and time. He appears well-developed and well-nourished. No distress.  HENT:  Head: Normocephalic and atraumatic.  Eyes: Conjunctivae and lids are normal. Right eye exhibits no discharge. Left eye exhibits no discharge. No scleral icterus.  Neck: Normal range of motion. Neck supple. No JVD present. Carotid bruit is not present.  Cardiovascular: Normal rate, regular rhythm and normal heart sounds.   Pulmonary/Chest: Effort normal and breath sounds normal. No respiratory distress.  Abdominal: Normal appearance. There is no splenomegaly  or hepatomegaly.  Musculoskeletal: Normal range of motion.  Neurological: He is alert and oriented to person, place, and time.  Skin: Skin is warm, dry and intact. No rash noted. No pallor.  Psychiatric: He has a normal mood and affect. His behavior is normal. Judgment and thought content normal.    Results for orders placed or performed in visit on 08/20/15  Comprehensive metabolic panel  Result Value Ref Range   Glucose 138 (H) 65 - 99 mg/dL   BUN 24 6 - 24 mg/dL   Creatinine, Ser 1.80 (H) 0.76 - 1.27 mg/dL   GFR calc non Af Amer 42 (L) >59 mL/min/1.73   GFR calc Af Amer 49 (L) >59 mL/min/1.73   BUN/Creatinine Ratio 13 9 - 20   Sodium 139 134 - 144 mmol/L   Potassium 3.7 3.5 - 5.2 mmol/L   Chloride 96 96 - 106 mmol/L   CO2 24 18 - 29 mmol/L   Calcium 9.0 8.7 - 10.2 mg/dL   Total Protein 6.8 6.0 - 8.5 g/dL   Albumin 4.2 3.5 - 5.5 g/dL   Globulin, Total 2.6 1.5 - 4.5 g/dL   Albumin/Globulin Ratio 1.6 1.2 - 2.2   Bilirubin Total 0.3 0.0 - 1.2 mg/dL   Alkaline  Phosphatase 73 39 - 117 IU/L   AST 18 0 - 40 IU/L   ALT 13 0 - 44 IU/L  CBC with Differential/Platelet  Result Value Ref Range   WBC 9.8 3.4 - 10.8 x10E3/uL   RBC 4.80 4.14 - 5.80 x10E6/uL   Hemoglobin 13.7 12.6 - 17.7 g/dL   Hematocrit 41.6 37.5 - 51.0 %   MCV 87 79 - 97 fL   MCH 28.5 26.6 - 33.0 pg   MCHC 32.9 31.5 - 35.7 g/dL   RDW 14.1 12.3 - 15.4 %   Platelets 202 150 - 379 x10E3/uL   Neutrophils 69 %   Lymphs 20 %   Monocytes 8 %   Eos 3 %   Basos 0 %   Neutrophils Absolute 6.8 1.4 - 7.0 x10E3/uL   Lymphocytes Absolute 2.0 0.7 - 3.1 x10E3/uL   Monocytes Absolute 0.8 0.1 - 0.9 x10E3/uL   EOS (ABSOLUTE) 0.3 0.0 - 0.4 x10E3/uL   Basophils Absolute 0.0 0.0 - 0.2 x10E3/uL   Immature Granulocytes 0 %   Immature Grans (Abs) 0.0 0.0 - 0.1 x10E3/uL  TSH  Result Value Ref Range   TSH 1.010 0.450 - 4.500 uIU/mL      Assessment & Plan:   Problem List Items Addressed This Visit      Unprioritized   Benign  hypertensive renal disease    Discussed pt with Dr. Wynetta Emery.  Needs to see nephrology.  Encourageed to continue staying away from ETOH.  Will ask nephrology to adjust medication.        Chronic kidney disease    Nephrology pending.        Hyperlipidemia    Order future lipid panel when fasting       Other Visit Diagnoses   None.      Follow up plan: No Follow-up on file.

## 2015-08-27 NOTE — Assessment & Plan Note (Signed)
Order future lipid panel when fasting

## 2015-09-03 ENCOUNTER — Ambulatory Visit: Payer: BLUE CROSS/BLUE SHIELD | Admitting: Family Medicine

## 2015-09-06 ENCOUNTER — Telehealth: Payer: Self-pay | Admitting: Family Medicine

## 2015-09-06 DIAGNOSIS — I129 Hypertensive chronic kidney disease with stage 1 through stage 4 chronic kidney disease, or unspecified chronic kidney disease: Secondary | ICD-10-CM

## 2015-09-06 MED ORDER — METOPROLOL SUCCINATE ER 50 MG PO TB24: 100.0000 mg | ORAL_TABLET | Freq: Every day | ORAL | 12 refills | Status: DC

## 2015-09-06 NOTE — Telephone Encounter (Signed)
Cheryl: When patient was here on 08/20/15, you increased his BP medication to BID. Please send another prescription to [facility]

## 2015-09-06 NOTE — Telephone Encounter (Signed)
Pt needs metoprolol succinate (TOPROL-XL) 50 MG 24 hr tablet sent to [facility].

## 2015-09-10 ENCOUNTER — Encounter: Payer: Self-pay | Admitting: Family Medicine

## 2015-09-10 ENCOUNTER — Ambulatory Visit (INDEPENDENT_AMBULATORY_CARE_PROVIDER_SITE_OTHER): Payer: BLUE CROSS/BLUE SHIELD | Admitting: Family Medicine

## 2015-09-10 DIAGNOSIS — I129 Hypertensive chronic kidney disease with stage 1 through stage 4 chronic kidney disease, or unspecified chronic kidney disease: Secondary | ICD-10-CM

## 2015-09-10 NOTE — Progress Notes (Signed)
BP (!) 158/82   Pulse 73   Temp 98.2 F (36.8 C)   Wt 221 lb (100.2 kg)   SpO2 100%   BMI 32.64 kg/m    Subjective:    Patient ID: Anthony Ouch., male    DOB: 08-03-1960, 56 y.o.   MRN: [id number]  HPI: Anthony Jacque. is a 55 y.o. male  Chief Complaint  Patient presents with  . Hypertension   HYPERTENSION Hypertension status: better  Satisfied with current treatment? yes Duration of hypertension: chronic BP monitoring frequency:  a few times a week BP range: 150s up to the 170s BP medication side effects:  no Medication compliance: excellent compliance Aspirin: no Recurrent headaches: no Visual changes: no Palpitations: no Dyspnea: no Chest pain: no Lower extremity edema: no Dizzy/lightheaded: no  Relevant past medical, surgical, family and social history reviewed and updated as indicated. Interim medical history since our last visit reviewed. Allergies and medications reviewed and updated.  Review of Systems  Constitutional: Negative.   Respiratory: Negative.   Cardiovascular: Negative.   Psychiatric/Behavioral: Negative.     Per HPI unless specifically indicated above     Objective:    BP (!) 158/82   Pulse 73   Temp 98.2 F (36.8 C)   Wt 221 lb (100.2 kg)   SpO2 100%   BMI 32.64 kg/m   Wt Readings from Last 3 Encounters:  09/10/15 221 lb (100.2 kg)  08/27/15 218 lb (98.9 kg)  08/20/15 215 lb (97.5 kg)    Physical Exam  Constitutional: He is oriented to person, place, and time. He appears well-developed and well-nourished. No distress.  HENT:  Head: Normocephalic and atraumatic.  Right Ear: Hearing normal.  Left Ear: Hearing normal.  Nose: Nose normal.  Eyes: Conjunctivae and lids are normal. Right eye exhibits no discharge. Left eye exhibits no discharge. No scleral icterus.  Cardiovascular: Normal rate, regular rhythm, normal heart sounds and intact distal pulses.  Exam reveals no gallop and no friction rub.   No murmur  heard. Pulmonary/Chest: Effort normal. No respiratory distress. He has no wheezes. He has no rales. He exhibits no tenderness.  Musculoskeletal: Normal range of motion.  Neurological: He is alert and oriented to person, place, and time.  Skin: Skin is warm, dry and intact. No rash noted. No erythema. No pallor.  Psychiatric: He has a normal mood and affect. His speech is normal and behavior is normal. Judgment and thought content normal. Cognition and memory are normal.  Nursing note and vitals reviewed.   Results for orders placed or performed in visit on 08/20/15  Comprehensive metabolic panel  Result Value Ref Range   Glucose 138 (H) 65 - 99 mg/dL   BUN 24 6 - 24 mg/dL   Creatinine, Ser 1.80 (H) 0.76 - 1.27 mg/dL   GFR calc non Af Amer 42 (L) >59 mL/min/1.73   GFR calc Af Amer 49 (L) >59 mL/min/1.73   BUN/Creatinine Ratio 13 9 - 20   Sodium 139 134 - 144 mmol/L   Potassium 3.7 3.5 - 5.2 mmol/L   Chloride 96 96 - 106 mmol/L   CO2 24 18 - 29 mmol/L   Calcium 9.0 8.7 - 10.2 mg/dL   Total Protein 6.8 6.0 - 8.5 g/dL   Albumin 4.2 3.5 - 5.5 g/dL   Globulin, Total 2.6 1.5 - 4.5 g/dL   Albumin/Globulin Ratio 1.6 1.2 - 2.2   Bilirubin Total 0.3 0.0 - 1.2 mg/dL   Alkaline Phosphatase  73 39 - 117 IU/L   AST 18 0 - 40 IU/L   ALT 13 0 - 44 IU/L  CBC with Differential/Platelet  Result Value Ref Range   WBC 9.8 3.4 - 10.8 x10E3/uL   RBC 4.80 4.14 - 5.80 x10E6/uL   Hemoglobin 13.7 12.6 - 17.7 g/dL   Hematocrit 41.6 37.5 - 51.0 %   MCV 87 79 - 97 fL   MCH 28.5 26.6 - 33.0 pg   MCHC 32.9 31.5 - 35.7 g/dL   RDW 14.1 12.3 - 15.4 %   Platelets 202 150 - 379 x10E3/uL   Neutrophils 69 %   Lymphs 20 %   Monocytes 8 %   Eos 3 %   Basos 0 %   Neutrophils Absolute 6.8 1.4 - 7.0 x10E3/uL   Lymphocytes Absolute 2.0 0.7 - 3.1 x10E3/uL   Monocytes Absolute 0.8 0.1 - 0.9 x10E3/uL   EOS (ABSOLUTE) 0.3 0.0 - 0.4 x10E3/uL   Basophils Absolute 0.0 0.0 - 0.2 x10E3/uL   Immature Granulocytes 0 %    Immature Grans (Abs) 0.0 0.0 - 0.1 x10E3/uL  TSH  Result Value Ref Range   TSH 1.010 0.450 - 4.500 uIU/mL      Assessment & Plan:   Problem List Items Addressed This Visit      Genitourinary   Benign hypertensive renal disease    Better, but still not under great control. Seeing nephrology on 09/21/15. Continue current regimen. Continue to monitor at home. Call with concerns. Recheck 3 weeks.        Other Visit Diagnoses   None.      Follow up plan: Return in about 3 weeks (around 10/01/2015) for BP check.

## 2015-09-10 NOTE — Assessment & Plan Note (Signed)
Better, but still not under great control. Seeing nephrology on 09/21/15. Continue current regimen. Continue to monitor at home. Call with concerns. Recheck 3 weeks.

## 2015-10-01 ENCOUNTER — Ambulatory Visit: Payer: BLUE CROSS/BLUE SHIELD | Admitting: Family Medicine

## 2015-10-03 ENCOUNTER — Other Ambulatory Visit: Payer: Self-pay | Admitting: Unknown Physician Specialty

## 2015-10-03 DIAGNOSIS — I129 Hypertensive chronic kidney disease with stage 1 through stage 4 chronic kidney disease, or unspecified chronic kidney disease: Secondary | ICD-10-CM

## 2015-10-10 ENCOUNTER — Other Ambulatory Visit: Payer: Self-pay | Admitting: Unknown Physician Specialty

## 2015-11-05 ENCOUNTER — Ambulatory Visit: Payer: Self-pay | Attending: Neurology

## 2015-11-05 DIAGNOSIS — Z79899 Other long term (current) drug therapy: Secondary | ICD-10-CM | POA: Insufficient documentation

## 2015-11-05 DIAGNOSIS — Z8673 Personal history of transient ischemic attack (TIA), and cerebral infarction without residual deficits: Secondary | ICD-10-CM | POA: Insufficient documentation

## 2015-11-05 DIAGNOSIS — G4733 Obstructive sleep apnea (adult) (pediatric): Secondary | ICD-10-CM | POA: Insufficient documentation

## 2015-11-05 DIAGNOSIS — N189 Chronic kidney disease, unspecified: Secondary | ICD-10-CM | POA: Insufficient documentation

## 2015-11-05 DIAGNOSIS — I129 Hypertensive chronic kidney disease with stage 1 through stage 4 chronic kidney disease, or unspecified chronic kidney disease: Secondary | ICD-10-CM | POA: Insufficient documentation

## 2015-11-08 ENCOUNTER — Other Ambulatory Visit (INDEPENDENT_AMBULATORY_CARE_PROVIDER_SITE_OTHER): Payer: Self-pay | Admitting: Nephrology

## 2015-11-08 DIAGNOSIS — I10 Essential (primary) hypertension: Secondary | ICD-10-CM

## 2015-11-08 DIAGNOSIS — N183 Chronic kidney disease, stage 3 unspecified: Secondary | ICD-10-CM

## 2015-11-08 DIAGNOSIS — R809 Proteinuria, unspecified: Secondary | ICD-10-CM

## 2015-11-09 ENCOUNTER — Ambulatory Visit (INDEPENDENT_AMBULATORY_CARE_PROVIDER_SITE_OTHER): Payer: BLUE CROSS/BLUE SHIELD

## 2015-11-09 DIAGNOSIS — N183 Chronic kidney disease, stage 3 unspecified: Secondary | ICD-10-CM

## 2015-11-09 DIAGNOSIS — N189 Chronic kidney disease, unspecified: Secondary | ICD-10-CM | POA: Diagnosis not present

## 2015-11-09 DIAGNOSIS — I10 Essential (primary) hypertension: Secondary | ICD-10-CM

## 2015-11-09 DIAGNOSIS — R809 Proteinuria, unspecified: Secondary | ICD-10-CM

## 2015-11-10 ENCOUNTER — Encounter (INDEPENDENT_AMBULATORY_CARE_PROVIDER_SITE_OTHER): Payer: Self-pay

## 2015-12-02 ENCOUNTER — Other Ambulatory Visit: Payer: Self-pay | Admitting: Family Medicine

## 2015-12-02 NOTE — Telephone Encounter (Signed)
apt 

## 2015-12-02 NOTE — Telephone Encounter (Signed)
Called to speak with Anthony Sellers but he was unavailable so Anthony Sellers scheduled it for 12/10/15.

## 2015-12-09 ENCOUNTER — Other Ambulatory Visit: Payer: Self-pay | Admitting: Family Medicine

## 2015-12-09 DIAGNOSIS — I129 Hypertensive chronic kidney disease with stage 1 through stage 4 chronic kidney disease, or unspecified chronic kidney disease: Secondary | ICD-10-CM

## 2015-12-10 ENCOUNTER — Ambulatory Visit: Payer: BLUE CROSS/BLUE SHIELD | Admitting: Family Medicine

## 2016-02-04 ENCOUNTER — Other Ambulatory Visit: Payer: Self-pay | Admitting: Family Medicine

## 2016-02-04 DIAGNOSIS — I129 Hypertensive chronic kidney disease with stage 1 through stage 4 chronic kidney disease, or unspecified chronic kidney disease: Secondary | ICD-10-CM

## 2016-02-07 NOTE — Telephone Encounter (Signed)
apt 

## 2016-02-07 NOTE — Telephone Encounter (Signed)
Called and left patient message.

## 2016-02-29 ENCOUNTER — Encounter: Payer: Self-pay | Admitting: Family Medicine

## 2016-02-29 ENCOUNTER — Ambulatory Visit (INDEPENDENT_AMBULATORY_CARE_PROVIDER_SITE_OTHER): Payer: BLUE CROSS/BLUE SHIELD | Admitting: Family Medicine

## 2016-02-29 VITALS — BP 116/80 | HR 81 | Temp 98.0°F | Ht 66.0 in | Wt 231.3 lb

## 2016-02-29 DIAGNOSIS — G4733 Obstructive sleep apnea (adult) (pediatric): Secondary | ICD-10-CM

## 2016-02-29 DIAGNOSIS — I129 Hypertensive chronic kidney disease with stage 1 through stage 4 chronic kidney disease, or unspecified chronic kidney disease: Secondary | ICD-10-CM

## 2016-02-29 MED ORDER — AMLODIPINE-ATORVASTATIN 5-40 MG PO TABS: 1.0000 | ORAL_TABLET | Freq: Every day | ORAL | 6 refills | Status: DC

## 2016-02-29 MED ORDER — CHLORTHALIDONE 25 MG PO TABS: 25.0000 mg | ORAL_TABLET | Freq: Every day | ORAL | 6 refills | Status: DC

## 2016-02-29 NOTE — Assessment & Plan Note (Signed)
To be seeing pulmonology. Will recheck BP in 3 months on CPAP to see if we can decrease meds.

## 2016-02-29 NOTE — Assessment & Plan Note (Signed)
Better on recheck today. Will recheck BP in 3 months on CPAP to see if we can decrease meds. Continue to follow with nephrology.

## 2016-02-29 NOTE — Progress Notes (Signed)
BP 116/80   Pulse 81   Temp 98 F (36.7 C)   Ht 5\' 6"  (1.676 m)   Wt 231 lb 4.8 oz (104.9 kg)   SpO2 98%   BMI 37.33 kg/m    Subjective:    Patient ID: Anthony Ouch., male    DOB: 08/27/1960, 56 y.o.   MRN: [id number]  HPI: Anthony Maio. is a 56 y.o. male  Chief Complaint  Patient presents with  . Hypertension   HYPERTENSION- following with nephrology. Recently diagnosed with OSA, but not on CPAP yet. BP still elevated.  Hypertension status: stable  Satisfied with current treatment? yes Duration of hypertension: chronic BP monitoring frequency:  daily BP range: 120s-130s/80s BP medication side effects:  no Medication compliance: excellent compliance Previous BP meds: spironalactone, metoprolol, lisinopril, hydralazine, chlorthalidone, amlodipine Aspirin: no Recurrent headaches: no Visual changes: no Palpitations: no Dyspnea: no Chest pain: no Lower extremity edema: no Dizzy/lightheaded: no   Relevant past medical, surgical, family and social history reviewed and updated as indicated. Interim medical history since our last visit reviewed. Allergies and medications reviewed and updated.  Review of Systems  Constitutional: Negative.   Respiratory: Negative.   Cardiovascular: Negative.   Psychiatric/Behavioral: Negative.     Per HPI unless specifically indicated above     Objective:    BP 116/80   Pulse 81   Temp 98 F (36.7 C)   Ht 5\' 6"  (1.676 m)   Wt 231 lb 4.8 oz (104.9 kg)   SpO2 98%   BMI 37.33 kg/m   Wt Readings from Last 3 Encounters:  02/29/16 231 lb 4.8 oz (104.9 kg)  09/10/15 221 lb (100.2 kg)  08/27/15 218 lb (98.9 kg)    Physical Exam  Constitutional: He is oriented to person, place, and time. He appears well-developed and well-nourished. No distress.  HENT:  Head: Normocephalic and atraumatic.  Right Ear: Hearing normal.  Left Ear: Hearing normal.  Nose: Nose normal.  Eyes: Conjunctivae and lids are normal. Right eye  exhibits no discharge. Left eye exhibits no discharge. No scleral icterus.  Cardiovascular: Normal rate, regular rhythm, normal heart sounds and intact distal pulses.  Exam reveals no gallop and no friction rub.   No murmur heard. Pulmonary/Chest: Effort normal and breath sounds normal. No respiratory distress. He has no wheezes. He has no rales. He exhibits no tenderness.  Musculoskeletal: Normal range of motion.  Neurological: He is alert and oriented to person, place, and time.  Skin: Skin is intact. No rash noted.  Psychiatric: He has a normal mood and affect. His speech is normal and behavior is normal. Judgment and thought content normal. Cognition and memory are normal.  Nursing note and vitals reviewed.   Results for orders placed or performed in visit on 08/20/15  Comprehensive metabolic panel  Result Value Ref Range   Glucose 138 (H) 65 - 99 mg/dL   BUN 24 6 - 24 mg/dL   Creatinine, Ser 1.80 (H) 0.76 - 1.27 mg/dL   GFR calc non Af Amer 42 (L) >59 mL/min/1.73   GFR calc Af Amer 49 (L) >59 mL/min/1.73   BUN/Creatinine Ratio 13 9 - 20   Sodium 139 134 - 144 mmol/L   Potassium 3.7 3.5 - 5.2 mmol/L   Chloride 96 96 - 106 mmol/L   CO2 24 18 - 29 mmol/L   Calcium 9.0 8.7 - 10.2 mg/dL   Total Protein 6.8 6.0 - 8.5 g/dL   Albumin 4.2 3.5 -  5.5 g/dL   Globulin, Total 2.6 1.5 - 4.5 g/dL   Albumin/Globulin Ratio 1.6 1.2 - 2.2   Bilirubin Total 0.3 0.0 - 1.2 mg/dL   Alkaline Phosphatase 73 39 - 117 IU/L   AST 18 0 - 40 IU/L   ALT 13 0 - 44 IU/L  CBC with Differential/Platelet  Result Value Ref Range   WBC 9.8 3.4 - 10.8 x10E3/uL   RBC 4.80 4.14 - 5.80 x10E6/uL   Hemoglobin 13.7 12.6 - 17.7 g/dL   Hematocrit 41.6 37.5 - 51.0 %   MCV 87 79 - 97 fL   MCH 28.5 26.6 - 33.0 pg   MCHC 32.9 31.5 - 35.7 g/dL   RDW 14.1 12.3 - 15.4 %   Platelets 202 150 - 379 x10E3/uL   Neutrophils 69 %   Lymphs 20 %   Monocytes 8 %   Eos 3 %   Basos 0 %   Neutrophils Absolute 6.8 1.4 - 7.0  x10E3/uL   Lymphocytes Absolute 2.0 0.7 - 3.1 x10E3/uL   Monocytes Absolute 0.8 0.1 - 0.9 x10E3/uL   EOS (ABSOLUTE) 0.3 0.0 - 0.4 x10E3/uL   Basophils Absolute 0.0 0.0 - 0.2 x10E3/uL   Immature Granulocytes 0 %   Immature Grans (Abs) 0.0 0.0 - 0.1 x10E3/uL  TSH  Result Value Ref Range   TSH 1.010 0.450 - 4.500 uIU/mL      Assessment & Plan:   Problem List Items Addressed This Visit      Respiratory   Sleep apnea    To be seeing pulmonology. Will recheck BP in 3 months on CPAP to see if we can decrease meds.         Genitourinary   Benign hypertensive renal disease - Primary    Better on recheck today. Will recheck BP in 3 months on CPAP to see if we can decrease meds. Continue to follow with nephrology.          Follow up plan: Return in about 3 months (around 05/28/2016) for Physical.

## 2016-03-02 ENCOUNTER — Telehealth: Payer: Self-pay

## 2016-03-02 NOTE — Telephone Encounter (Signed)
Paper referral from [place] Dr. Driscilla Moats.  LMOV to schedule an appt.

## 2016-04-03 ENCOUNTER — Institutional Professional Consult (permissible substitution): Payer: Self-pay | Admitting: Internal Medicine

## 2016-05-15 ENCOUNTER — Ambulatory Visit (INDEPENDENT_AMBULATORY_CARE_PROVIDER_SITE_OTHER): Payer: BLUE CROSS/BLUE SHIELD | Admitting: Internal Medicine

## 2016-05-15 ENCOUNTER — Encounter: Payer: Self-pay | Admitting: Internal Medicine

## 2016-05-15 VITALS — BP 138/80 | HR 71 | Ht 66.0 in | Wt 227.0 lb

## 2016-05-15 DIAGNOSIS — G4733 Obstructive sleep apnea (adult) (pediatric): Secondary | ICD-10-CM | POA: Diagnosis not present

## 2016-05-15 NOTE — Patient Instructions (Signed)
Place CPAP titration study

## 2016-05-15 NOTE — Progress Notes (Signed)
[facility] Consultation      Date: 05/15/2016,   MRN# [id number] Anthony LIZER Sr. Jan 07, 1961 Code Status:  Code Status History    This patient does not have a recorded code status. Please follow your organizational policy for patients in this situation.     Hosp day:@LENGTHOFSTAYDAYS @ Referring MD: @ATDPROV @     PCP:      AdmissionWeight: 227 lb (103 kg)                 CurrentWeight: 227 lb (103 kg) Anthony F Oddo Sr. is a 56 y.o. old male seen in consultation for uncontrolled BP at the request of Dr. Holley Raring     CHIEF COMPLAINT:   No acute issues   HISTORY OF PRESENT ILLNESS   57 yo AAF with BMI 35 with ongoing uncontrolled BP with CKD stage 3. Patient underwent sleep study and has been dx with moderate sleep apnea and severe sleep apnea during REM sleep  Patient is former smoker, works as Librarian, academic Has lack of energy Has noted to have loud snoring Has excessive daytime tiredness  Sleep Study 10/2015 Shows Mild to Severe sleep apnea(AHI 5.3) AHI 42 with REM sleep Longest apnea spell  Was 24 secs Patient has gained 30 pounds in past 1 year  I have explained results to patient and he undertsands the consequences of untreated OSA He is determined to lose weight  No signs of infection at this time   PAST MEDICAL HISTORY   Past Medical History:  Diagnosis Date  . Enlarged prostate   . Hyperlipidemia   . Hypertension   . IFG (impaired fasting glucose)   . Vitamin D deficiency      SURGICAL HISTORY   No past surgical history on file.   FAMILY HISTORY   Family History  Problem Relation Age of Onset  . Hypertension Mother   . Alzheimer's disease Father   . Hypertension Brother   . Cancer Maternal Grandfather     Prostate  . Cancer Sister     Breast Cancer     SOCIAL HISTORY   Social History  Substance Use Topics  . Smoking status: Former Smoker    Packs/day: 0.25    Types: Cigarettes  . Smokeless tobacco: Never  Used  . Alcohol use No     MEDICATIONS    Home Medication:  Current Outpatient Rx  . Order #: [id number] Class: Normal  . Order #: [id number] Class: Normal  . Order #: [id number] Class: Historical Med  . Order #: [id number] Class: Historical Med  . Order #: [id number] Class: Normal  . Order #: [id number] Class: Historical Med    Current Medication:  Current Outpatient Prescriptions:  .  amLODipine-atorvastatin (CADUET) 5-40 MG tablet, Take 1 tablet by mouth daily., Disp: 30 tablet, Rfl: 6 .  chlorthalidone (HYGROTON) 25 MG tablet, Take 1 tablet (25 mg total) by mouth daily., Disp: 30 tablet, Rfl: 6 .  hydrALAZINE (APRESOLINE) 100 MG tablet, Take 100 mg by mouth 2 (two) times daily., Disp: , Rfl:  .  lisinopril (PRINIVIL,ZESTRIL) 40 MG tablet, Take 40 mg by mouth daily., Disp: , Rfl:  .  metoprolol succinate (TOPROL-XL) 50 MG 24 hr tablet, Take 2 tablets (100 mg total) by mouth daily. Take with or immediately following a meal., Disp: 60 tablet, Rfl: 12 .  spironolactone (ALDACTONE) 25 MG tablet, Take 25 mg by mouth 2 (two) times daily., Disp: , Rfl:     ALLERGIES   Patient has no known allergies.  REVIEW OF SYSTEMS   Review of Systems  Constitutional: Positive for malaise/fatigue. Negative for chills, diaphoresis, fever and weight loss.  HENT: Negative for congestion and hearing loss.   Eyes: Negative for blurred vision and double vision.  Respiratory: Negative for cough, hemoptysis, sputum production, shortness of breath and wheezing.   Cardiovascular: Negative for chest pain, palpitations and orthopnea.  Gastrointestinal: Negative for abdominal pain, heartburn, nausea and vomiting.  Genitourinary: Negative for dysuria and urgency.  Musculoskeletal: Negative for back pain, myalgias and neck pain.  Skin: Negative for rash.  Neurological: Negative for dizziness and weakness.  Endo/Heme/Allergies: Does not bruise/bleed easily.  Psychiatric/Behavioral: Negative for substance  abuse.  All other systems reviewed and are negative.    VS: BP 138/80 (BP Location: Left Arm, Patient Position: Sitting, Cuff Size: Normal)   Pulse 71   Ht 5\' 6"  (1.676 m)   Wt 227 lb (103 kg)   SpO2 91%   BMI 36.64 kg/m      PHYSICAL EXAM  Physical Exam  Constitutional: He is oriented to person, place, and time. He appears well-developed and well-nourished. No distress.  HENT:  Head: Normocephalic and atraumatic.  Mouth/Throat: No oropharyngeal exudate.  Eyes: EOM are normal. Pupils are equal, round, and reactive to light. No scleral icterus.  Neck: Normal range of motion. Neck supple.  Cardiovascular: Normal rate, regular rhythm and normal heart sounds.   No murmur heard. Pulmonary/Chest: No stridor. No respiratory distress. He has no wheezes.  Abdominal: Soft. Bowel sounds are normal.  Musculoskeletal: Normal range of motion. He exhibits no edema.  Neurological: He is alert and oriented to person, place, and time. No cranial nerve deficit.  Skin: Skin is warm. He is not diaphoretic.  Psychiatric: He has a normal mood and affect.          ASSESSMENT/PLAN   56 yo overweight AAM with uncontrolled HTN with dx of OSA  Patient will need CPAP titration study and will need to lose weight  Follow up after 3 months for assessment   Patient satisfied with Plan of action and management. All questions answered  [name], M.D.  [facility]  Medical Director [place] Director [facility] Cardio-Pulmonary Department

## 2016-05-22 ENCOUNTER — Emergency Department
Admission: EM | Admit: 2016-05-22 | Discharge: 2016-05-22 | Discharge status: Against Medical Advice | Payer: BLUE CROSS/BLUE SHIELD

## 2016-05-30 ENCOUNTER — Encounter: Payer: BLUE CROSS/BLUE SHIELD | Admitting: Family Medicine

## 2016-06-27 ENCOUNTER — Encounter: Payer: Self-pay | Admitting: Internal Medicine

## 2016-07-04 ENCOUNTER — Ambulatory Visit (INDEPENDENT_AMBULATORY_CARE_PROVIDER_SITE_OTHER): Payer: BLUE CROSS/BLUE SHIELD | Admitting: Family Medicine

## 2016-07-04 ENCOUNTER — Encounter: Payer: Self-pay | Admitting: Family Medicine

## 2016-07-04 VITALS — BP 113/72 | HR 75 | Temp 97.6°F | Ht 69.1 in | Wt 225.6 lb

## 2016-07-04 DIAGNOSIS — N400 Enlarged prostate without lower urinary tract symptoms: Secondary | ICD-10-CM

## 2016-07-04 DIAGNOSIS — G4733 Obstructive sleep apnea (adult) (pediatric): Secondary | ICD-10-CM | POA: Diagnosis not present

## 2016-07-04 DIAGNOSIS — E559 Vitamin D deficiency, unspecified: Secondary | ICD-10-CM | POA: Diagnosis not present

## 2016-07-04 DIAGNOSIS — N183 Chronic kidney disease, stage 3 unspecified: Secondary | ICD-10-CM

## 2016-07-04 DIAGNOSIS — R7301 Impaired fasting glucose: Secondary | ICD-10-CM

## 2016-07-04 DIAGNOSIS — I129 Hypertensive chronic kidney disease with stage 1 through stage 4 chronic kidney disease, or unspecified chronic kidney disease: Secondary | ICD-10-CM | POA: Diagnosis not present

## 2016-07-04 DIAGNOSIS — R5382 Chronic fatigue, unspecified: Secondary | ICD-10-CM

## 2016-07-04 DIAGNOSIS — Z0000 Encounter for general adult medical examination without abnormal findings: Secondary | ICD-10-CM | POA: Diagnosis not present

## 2016-07-04 DIAGNOSIS — Z1211 Encounter for screening for malignant neoplasm of colon: Secondary | ICD-10-CM

## 2016-07-04 DIAGNOSIS — E782 Mixed hyperlipidemia: Secondary | ICD-10-CM

## 2016-07-04 LAB — UA/M W/RFLX CULTURE, ROUTINE
BILIRUBIN UA: NEGATIVE
GLUCOSE, UA: NEGATIVE
KETONES UA: NEGATIVE
LEUKOCYTES UA: NEGATIVE
Nitrite, UA: NEGATIVE
PROTEIN UA: NEGATIVE
RBC UA: NEGATIVE
SPEC GRAV UA: 1.02 (ref 1.005–1.030)
Urobilinogen, Ur: 0.2 mg/dL (ref 0.2–1.0)
pH, UA: 5 (ref 5.0–7.5)

## 2016-07-04 LAB — MICROSCOPIC EXAMINATION: BACTERIA UA: NONE SEEN

## 2016-07-04 LAB — MICROALBUMIN, URINE WAIVED
Creatinine, Urine Waived: 200 mg/dL (ref 10–300)
MICROALB, UR WAIVED: 10 mg/L (ref 0–19)

## 2016-07-04 LAB — BAYER DCA HB A1C WAIVED: HB A1C: 6.1 % (ref <7.0)

## 2016-07-04 MED ORDER — SPIRONOLACTONE 25 MG PO TABS: 25.0000 mg | ORAL_TABLET | Freq: Two times a day (BID) | ORAL | 1 refills | Status: DC

## 2016-07-04 MED ORDER — HYDRALAZINE HCL 100 MG PO TABS: 100.0000 mg | ORAL_TABLET | Freq: Two times a day (BID) | ORAL | 1 refills | Status: DC

## 2016-07-04 MED ORDER — CHLORTHALIDONE 25 MG PO TABS: 25.0000 mg | ORAL_TABLET | Freq: Every day | ORAL | 1 refills | Status: DC

## 2016-07-04 MED ORDER — METOPROLOL SUCCINATE ER 50 MG PO TB24: 100.0000 mg | ORAL_TABLET | Freq: Every day | ORAL | 1 refills | Status: DC

## 2016-07-04 MED ORDER — LISINOPRIL 40 MG PO TABS: 40.0000 mg | ORAL_TABLET | Freq: Every day | ORAL | 1 refills | Status: DC

## 2016-07-04 MED ORDER — AMLODIPINE-ATORVASTATIN 5-40 MG PO TABS: 1.0000 | ORAL_TABLET | Freq: Every day | ORAL | 1 refills | Status: DC

## 2016-07-04 NOTE — Assessment & Plan Note (Addendum)
Under good control with A1c of 6.1 today. Call with any concerns. Continue diet and exercise.

## 2016-07-04 NOTE — Assessment & Plan Note (Signed)
Under good control on CPAP. Call with any concerns.

## 2016-07-04 NOTE — Assessment & Plan Note (Signed)
Rechecking labs today. Await results.  

## 2016-07-04 NOTE — Assessment & Plan Note (Signed)
Under good control. Continue to follow with nephrology. Call with any concerns. Refills given today.

## 2016-07-04 NOTE — Assessment & Plan Note (Signed)
Continue to follow with urology. PSA checked today. Continue to monitor.

## 2016-07-04 NOTE — Progress Notes (Addendum)
BP 113/72 (BP Location: Left Arm, Patient Position: Sitting, Cuff Size: Large)   Pulse 75   Temp 97.6 F (36.4 C)   Ht 5' 9.1" (1.755 m)   Wt 225 lb 9.6 oz (102.3 kg)   SpO2 98%   BMI 33.22 kg/m    Subjective:    Patient ID: Anthony Sellers., male    DOB: Dec 23, 1960, 56 y.o.   MRN: [id number]  HPI: Leomar Westberg. is a 56 y.o. male presenting on 07/04/2016 for comprehensive medical examination. Current medical complaints include:  HYPERTENSION / HYPERLIPIDEMIA Satisfied with current treatment? yes Duration of hypertension: chronic BP monitoring frequency: not checking BP medication side effects: no Past BP meds: spironalactone, metoprolol, lisinopril, hydralazine, chlorthalidone, amlodipine Duration of hyperlipidemia: chronic Cholesterol medication side effects: no Cholesterol supplements: none Past cholesterol medications: atorvastatin Medication compliance: excellent compliance Aspirin: no Recent stressors: no Recurrent headaches: no Visual changes: no Palpitations: no Dyspnea: no Chest pain: no Lower extremity edema: no Dizzy/lightheaded: no  Impaired Fasting Glucose HbA1C:  Lab Results  Component Value Date   HGBA1C 6.1 (A) 10/08/2014   Duration of elevated blood sugar: chronic Polydipsia: no Polyuria: no Weight change: no Visual disturbance: no Glucose Monitoring: no Diabetic Education: Not Completed Family history of diabetes: yes  BPH BPH status: controlled Satisfied with current treatment?: yes Duration: chronic Nocturia: 2-3x per night Urinary frequency:no Incomplete voiding: no Urgency: no Weak urinary stream: no Straining to start stream: no Dysuria: no Onset: gradual Severity: mild  Lives with his wife Interim Problems from his last visit: no  Depression Screen done today and results listed below:  Depression screen [facility] 2/9 09/10/2015  Decreased Interest 0  Down, Depressed, Hopeless 0  PHQ - 2 Score 0  Altered sleeping 0    Tired, decreased energy 0  Change in appetite 0  Feeling bad or failure about yourself  0  Trouble concentrating 0  Moving slowly or fidgety/restless 0  Suicidal thoughts 0  PHQ-9 Score 0    Past Medical History:  Past Medical History:  Diagnosis Date  . Enlarged prostate   . Hyperlipidemia   . Hypertension   . IFG (impaired fasting glucose)   . Vitamin D deficiency     Surgical History:  History reviewed. No pertinent surgical history.  Medications:  No current outpatient prescriptions on file prior to visit.   No current facility-administered medications on file prior to visit.     Allergies:  No Known Allergies  Social History:  Social History   Social History  . Marital status: Married    Spouse name: N/A  . Number of children: N/A  . Years of education: N/A   Occupational History  . Not on file.   Social History Main Topics  . Smoking status: Former Smoker    Packs/day: 0.25    Types: Cigarettes  . Smokeless tobacco: Never Used  . Alcohol use No  . Drug use: No  . Sexual activity: No   Other Topics Concern  . Not on file   Social History Narrative  . No narrative on file   History  Smoking Status  . Former Smoker  . Packs/day: 0.25  . Types: Cigarettes  Smokeless Tobacco  . Never Used   History  Alcohol Use No    Family History:  Family History  Problem Relation Age of Onset  . Hypertension Mother   . Alzheimer's disease Father   . Hypertension Brother   . Cancer Maternal Grandfather  Prostate  . Cancer Sister        Breast Cancer    Past medical history, surgical history, medications, allergies, family history and social history reviewed with patient today and changes made to appropriate areas of the chart.   Review of Systems  Constitutional: Positive for malaise/fatigue. Negative for chills, diaphoresis, fever and weight loss.  HENT: Negative.   Eyes: Negative.   Respiratory: Negative.   Cardiovascular: Negative.    Gastrointestinal: Negative.   Genitourinary: Negative.  Negative for dysuria, flank pain, frequency, hematuria and urgency.  Musculoskeletal: Negative.   Skin: Negative.   Neurological: Negative.  Negative for weakness.  Endo/Heme/Allergies: Negative.   Psychiatric/Behavioral: Negative.     All other ROS negative except what is listed above and in the HPI.      Objective:    BP 113/72 (BP Location: Left Arm, Patient Position: Sitting, Cuff Size: Large)   Pulse 75   Temp 97.6 F (36.4 C)   Ht 5' 9.1" (1.755 m)   Wt 225 lb 9.6 oz (102.3 kg)   SpO2 98%   BMI 33.22 kg/m   Wt Readings from Last 3 Encounters:  07/04/16 225 lb 9.6 oz (102.3 kg)  05/15/16 227 lb (103 kg)  02/29/16 231 lb 4.8 oz (104.9 kg)    Physical Exam  Constitutional: He is oriented to person, place, and time. He appears well-developed and well-nourished. No distress.  HENT:  Head: Normocephalic and atraumatic.  Right Ear: Hearing, tympanic membrane, external ear and ear canal normal.  Left Ear: Hearing, tympanic membrane, external ear and ear canal normal.  Nose: Nose normal.  Mouth/Throat: Uvula is midline, oropharynx is clear and moist and mucous membranes are normal. No oropharyngeal exudate.  Eyes: Conjunctivae, EOM and lids are normal. Pupils are equal, round, and reactive to light. Right eye exhibits no discharge. Left eye exhibits no discharge. No scleral icterus.  Neck: Normal range of motion. Neck supple. No JVD present. No tracheal deviation present. No thyromegaly present.  Cardiovascular: Normal rate, regular rhythm, normal heart sounds and intact distal pulses.  Exam reveals no gallop and no friction rub.   No murmur heard. Pulmonary/Chest: Effort normal and breath sounds normal. No stridor. No respiratory distress. He has no wheezes. He has no rales. He exhibits no tenderness.  Abdominal: Soft. Bowel sounds are normal. He exhibits no distension and no mass. There is no tenderness. There is no  rebound and no guarding.  Genitourinary:  Genitourinary Comments: Genital and prostate exams deferred- done at urology  Musculoskeletal: Normal range of motion. He exhibits no edema, tenderness or deformity.  Lymphadenopathy:    He has no cervical adenopathy.  Neurological: He is alert and oriented to person, place, and time. He has normal reflexes. He displays normal reflexes. No cranial nerve deficit. He exhibits normal muscle tone. Coordination normal.  Skin: Skin is warm, dry and intact. No rash noted. He is not diaphoretic. No erythema. No pallor.  Psychiatric: He has a normal mood and affect. His speech is normal and behavior is normal. Judgment and thought content normal. Cognition and memory are normal.  Nursing note and vitals reviewed.   Results for orders placed or performed in visit on 08/20/15  Comprehensive metabolic panel  Result Value Ref Range   Glucose 138 (H) 65 - 99 mg/dL   BUN 24 6 - 24 mg/dL   Creatinine, Ser 1.80 (H) 0.76 - 1.27 mg/dL   GFR calc non Af Amer 42 (L) >59 mL/min/1.73   GFR  calc Af Amer 49 (L) >59 mL/min/1.73   BUN/Creatinine Ratio 13 9 - 20   Sodium 139 134 - 144 mmol/L   Potassium 3.7 3.5 - 5.2 mmol/L   Chloride 96 96 - 106 mmol/L   CO2 24 18 - 29 mmol/L   Calcium 9.0 8.7 - 10.2 mg/dL   Total Protein 6.8 6.0 - 8.5 g/dL   Albumin 4.2 3.5 - 5.5 g/dL   Globulin, Total 2.6 1.5 - 4.5 g/dL   Albumin/Globulin Ratio 1.6 1.2 - 2.2   Bilirubin Total 0.3 0.0 - 1.2 mg/dL   Alkaline Phosphatase 73 39 - 117 IU/L   AST 18 0 - 40 IU/L   ALT 13 0 - 44 IU/L  CBC with Differential/Platelet  Result Value Ref Range   WBC 9.8 3.4 - 10.8 x10E3/uL   RBC 4.80 4.14 - 5.80 x10E6/uL   Hemoglobin 13.7 12.6 - 17.7 g/dL   Hematocrit 41.6 37.5 - 51.0 %   MCV 87 79 - 97 fL   MCH 28.5 26.6 - 33.0 pg   MCHC 32.9 31.5 - 35.7 g/dL   RDW 14.1 12.3 - 15.4 %   Platelets 202 150 - 379 x10E3/uL   Neutrophils 69 %   Lymphs 20 %   Monocytes 8 %   Eos 3 %   Basos 0 %    Neutrophils Absolute 6.8 1.4 - 7.0 x10E3/uL   Lymphocytes Absolute 2.0 0.7 - 3.1 x10E3/uL   Monocytes Absolute 0.8 0.1 - 0.9 x10E3/uL   EOS (ABSOLUTE) 0.3 0.0 - 0.4 x10E3/uL   Basophils Absolute 0.0 0.0 - 0.2 x10E3/uL   Immature Granulocytes 0 %   Immature Grans (Abs) 0.0 0.0 - 0.1 x10E3/uL  TSH  Result Value Ref Range   TSH 1.010 0.450 - 4.500 uIU/mL      Assessment & Plan:   Problem List Items Addressed This Visit      Respiratory   Sleep apnea    Under good control on CPAP. Call with any concerns.         Endocrine   IFG (impaired fasting glucose)    Under good control with A1c of 6.1 today. Call with any concerns. Continue diet and exercise.       Relevant Orders   Bayer DCA Hb A1c Waived   CBC with Differential/Platelet   Comprehensive metabolic panel   Microalbumin, Urine Waived   TSH   UA/M w/rflx Culture, Routine     Genitourinary   Benign hypertensive renal disease    Under good control. Continue to follow with nephrology. Call with any concerns. Refills given today.      Relevant Medications   metoprolol succinate (TOPROL-XL) 50 MG 24 hr tablet   Other Relevant Orders   CBC with Differential/Platelet   Comprehensive metabolic panel   Microalbumin, Urine Waived   TSH   UA/M w/rflx Culture, Routine   Chronic kidney disease    Rechecking labs today. Await results.       Relevant Orders   CBC with Differential/Platelet   Comprehensive metabolic panel   Microalbumin, Urine Waived   UA/M w/rflx Culture, Routine     Other   Hyperlipidemia    Under good control. Continue current regimen. Continue to monitor. Call with any concerns.       Relevant Medications   chlorthalidone (HYGROTON) 25 MG tablet   amLODipine-atorvastatin (CADUET) 5-40 MG tablet   metoprolol succinate (TOPROL-XL) 50 MG 24 hr tablet   hydrALAZINE (APRESOLINE) 100 MG tablet   lisinopril (  PRINIVIL,ZESTRIL) 40 MG tablet   spironolactone (ALDACTONE) 25 MG tablet   Other Relevant  Orders   CBC with Differential/Platelet   Comprehensive metabolic panel   Lipid Panel w/o Chol/HDL Ratio   UA/M w/rflx Culture, Routine   Vitamin D deficiency    Rechecking levels today. Await results.       Relevant Orders   CBC with Differential/Platelet   Comprehensive metabolic panel   UA/M w/rflx Culture, Routine   VITAMIN D 25 Hydroxy (Vit-D Deficiency, Fractures)   Enlarged prostate    Continue to follow with urology. PSA checked today. Continue to monitor.       Relevant Orders   CBC with Differential/Platelet   Comprehensive metabolic panel   PSA   UA/M w/rflx Culture, Routine    Other Visit Diagnoses    Routine general medical examination at a health care facility    -  Primary   Vaccines updated. Screening labs checked today. Cologuard ordered. Continue diet and exercise. Call with any concerns.    Chronic fatigue       Would like his testosterone checked- will return in the AM for more accurate level.   Relevant Orders   Testosterone, free, total   Screening for colon cancer       Relevant Orders   Cologuard      LABORATORY TESTING:  Health maintenance labs ordered today as discussed above.   The natural history of prostate cancer and ongoing controversy regarding screening and potential treatment outcomes of prostate cancer has been discussed with the patient. The meaning of a false positive PSA and a false negative PSA has been discussed. He indicates understanding of the limitations of this screening test and wishes to proceed with screening PSA testing.   IMMUNIZATIONS:   - Tdap: Tetanus vaccination status reviewed: last tetanus booster within 10 years. - Influenza: Postponed to flu season - Pneumovax: Not applicable  SCREENING: - Colonoscopy: cologuard ordered today.  Discussed with patient purpose of the colonoscopy is to detect colon cancer at curable precancerous or early stages   PATIENT COUNSELING:    Sexuality: Discussed sexually transmitted  diseases, partner selection, use of condoms, avoidance of unintended pregnancy  and contraceptive alternatives.   Advised to avoid cigarette smoking.  I discussed with the patient that most people either abstain from alcohol or drink within safe limits (<=14/week and <=4 drinks/occasion for males, <=7/weeks and <= 3 drinks/occasion for females) and that the risk for alcohol disorders and other health effects rises proportionally with the number of drinks per week and how often a drinker exceeds daily limits.  Discussed cessation/primary prevention of drug use and availability of treatment for abuse.   Diet: Encouraged to adjust caloric intake to maintain  or achieve ideal body weight, to reduce intake of dietary saturated fat and total fat, to limit sodium intake by avoiding high sodium foods and not adding table salt, and to maintain adequate dietary potassium and calcium preferably from fresh fruits, vegetables, and low-fat dairy products.    stressed the importance of regular exercise  Injury prevention: Discussed safety belts, safety helmets, smoke detector, smoking near bedding or upholstery.   Dental health: Discussed importance of regular tooth brushing, flossing, and dental visits.   Follow up plan: NEXT PREVENTATIVE PHYSICAL DUE IN 1 YEAR. Return in about 6 months (around 01/03/2017) for Follow up BP/Cholesterol.

## 2016-07-04 NOTE — Patient Instructions (Addendum)

## 2016-07-04 NOTE — Assessment & Plan Note (Signed)
Rechecking levels today. Await results.  

## 2016-07-04 NOTE — Assessment & Plan Note (Signed)
Under good control. Continue current regimen. Continue to monitor. Call with any concerns. 

## 2016-07-05 ENCOUNTER — Other Ambulatory Visit: Payer: BLUE CROSS/BLUE SHIELD

## 2016-07-05 DIAGNOSIS — R5382 Chronic fatigue, unspecified: Secondary | ICD-10-CM

## 2016-07-05 LAB — COMPREHENSIVE METABOLIC PANEL
ALT: 29 IU/L (ref 0–44)
AST: 21 IU/L (ref 0–40)
Albumin/Globulin Ratio: 1.7 (ref 1.2–2.2)
Albumin: 4.3 g/dL (ref 3.5–5.5)
Alkaline Phosphatase: 67 IU/L (ref 39–117)
BUN/Creatinine Ratio: 21 — ABNORMAL HIGH (ref 9–20)
BUN: 45 mg/dL — AB (ref 6–24)
Bilirubin Total: 0.2 mg/dL (ref 0.0–1.2)
CO2: 20 mmol/L (ref 20–29)
CREATININE: 2.19 mg/dL — AB (ref 0.76–1.27)
Calcium: 8.9 mg/dL (ref 8.7–10.2)
Chloride: 106 mmol/L (ref 96–106)
GFR calc Af Amer: 38 mL/min/{1.73_m2} — ABNORMAL LOW (ref >59)
GFR calc non Af Amer: 33 mL/min/{1.73_m2} — ABNORMAL LOW (ref >59)
GLUCOSE: 93 mg/dL (ref 65–99)
Globulin, Total: 2.6 g/dL (ref 1.5–4.5)
Potassium: 4.8 mmol/L (ref 3.5–5.2)
Sodium: 139 mmol/L (ref 134–144)
Total Protein: 6.9 g/dL (ref 6.0–8.5)

## 2016-07-05 LAB — CBC WITH DIFFERENTIAL/PLATELET
BASOS ABS: 0 10*3/uL (ref 0.0–0.2)
Basos: 0 %
EOS (ABSOLUTE): 0.3 10*3/uL (ref 0.0–0.4)
Eos: 5 %
HEMOGLOBIN: 11.8 g/dL — AB (ref 13.0–17.7)
Hematocrit: 36.3 % — ABNORMAL LOW (ref 37.5–51.0)
Immature Grans (Abs): 0 10*3/uL (ref 0.0–0.1)
Immature Granulocytes: 0 %
LYMPHS ABS: 1.5 10*3/uL (ref 0.7–3.1)
Lymphs: 21 %
MCH: 28 pg (ref 26.6–33.0)
MCHC: 32.5 g/dL (ref 31.5–35.7)
MCV: 86 fL (ref 79–97)
MONOCYTES: 5 %
Monocytes Absolute: 0.3 10*3/uL (ref 0.1–0.9)
Neutrophils Absolute: 4.9 10*3/uL (ref 1.4–7.0)
Neutrophils: 69 %
PLATELETS: 264 10*3/uL (ref 150–379)
RBC: 4.21 x10E6/uL (ref 4.14–5.80)
RDW: 14.5 % (ref 12.3–15.4)
WBC: 7.1 10*3/uL (ref 3.4–10.8)

## 2016-07-05 LAB — LIPID PANEL W/O CHOL/HDL RATIO
Cholesterol, Total: 130 mg/dL (ref 100–199)
HDL: 30 mg/dL — ABNORMAL LOW (ref >39)
LDL CALC: 69 mg/dL (ref 0–99)
TRIGLYCERIDES: 157 mg/dL — AB (ref 0–149)
VLDL CHOLESTEROL CAL: 31 mg/dL (ref 5–40)

## 2016-07-05 LAB — TSH: TSH: 1.34 u[IU]/mL (ref 0.450–4.500)

## 2016-07-05 LAB — PSA: Prostate Specific Ag, Serum: 0.9 ng/mL (ref 0.0–4.0)

## 2016-07-05 LAB — VITAMIN D 25 HYDROXY (VIT D DEFICIENCY, FRACTURES): VIT D 25 HYDROXY: 19.5 ng/mL — AB (ref 30.0–100.0)

## 2016-07-06 ENCOUNTER — Telehealth: Payer: Self-pay | Admitting: Family Medicine

## 2016-07-06 ENCOUNTER — Encounter: Payer: Self-pay | Admitting: Family Medicine

## 2016-07-06 LAB — TESTOSTERONE, FREE, TOTAL, SHBG
Sex Hormone Binding: 34.4 nmol/L (ref 19.3–76.4)
TESTOSTERONE FREE: 13.8 pg/mL (ref 7.2–24.0)
Testosterone: 498 ng/dL (ref 264–916)

## 2016-07-06 NOTE — Telephone Encounter (Signed)
Please let Anthony Sellers know that his kidney function got worse again. I want him to really push his fluids. When is he seeing his kidney doctor again?  His other labs looked good, but his vitamin D is low- so I sent him some to his pharmacy. His testosterone was also normal. Thanks!

## 2016-07-06 NOTE — Telephone Encounter (Signed)
Called patient, no answer, left a message for patient to return my call.  

## 2016-07-07 NOTE — Telephone Encounter (Signed)
Patient notified. Patient stated he sees the kidney doctor sometime next week.

## 2016-07-07 NOTE — Telephone Encounter (Signed)
Tiffany patients wife said to call him on his work number  [phone]   Thanks

## 2016-09-26 DIAGNOSIS — G4733 Obstructive sleep apnea (adult) (pediatric): Secondary | ICD-10-CM | POA: Diagnosis not present

## 2016-10-26 DIAGNOSIS — G4733 Obstructive sleep apnea (adult) (pediatric): Secondary | ICD-10-CM | POA: Diagnosis not present

## 2016-11-02 ENCOUNTER — Emergency Department
Admission: EM | Admit: 2016-11-02 | Discharge: 2016-11-03 | Disposition: A | Discharge status: Home / Self Care | Payer: 59 | Attending: Student in an Organized Health Care Education/Training Program | Admitting: Student in an Organized Health Care Education/Training Program

## 2016-11-02 ENCOUNTER — Encounter: Payer: Self-pay | Admitting: *Deleted

## 2016-11-02 DIAGNOSIS — Z79899 Other long term (current) drug therapy: Secondary | ICD-10-CM | POA: Insufficient documentation

## 2016-11-02 DIAGNOSIS — I129 Hypertensive chronic kidney disease with stage 1 through stage 4 chronic kidney disease, or unspecified chronic kidney disease: Secondary | ICD-10-CM | POA: Insufficient documentation

## 2016-11-02 DIAGNOSIS — N189 Chronic kidney disease, unspecified: Secondary | ICD-10-CM | POA: Diagnosis not present

## 2016-11-02 DIAGNOSIS — R1013 Epigastric pain: Secondary | ICD-10-CM | POA: Insufficient documentation

## 2016-11-02 DIAGNOSIS — Z87891 Personal history of nicotine dependence: Secondary | ICD-10-CM | POA: Diagnosis not present

## 2016-11-02 LAB — COMPREHENSIVE METABOLIC PANEL
ALT: 22 U/L (ref 17–63)
ANION GAP: 10 (ref 5–15)
AST: 22 U/L (ref 15–41)
Albumin: 4.4 g/dL (ref 3.5–5.0)
Alkaline Phosphatase: 62 U/L (ref 38–126)
BUN: 53 mg/dL — ABNORMAL HIGH (ref 6–20)
CHLORIDE: 111 mmol/L (ref 101–111)
CO2: 19 mmol/L — AB (ref 22–32)
Calcium: 9.1 mg/dL (ref 8.9–10.3)
Creatinine, Ser: 2.27 mg/dL — ABNORMAL HIGH (ref 0.61–1.24)
GFR calc non Af Amer: 31 mL/min — ABNORMAL LOW (ref >60)
GFR, EST AFRICAN AMERICAN: 36 mL/min — AB (ref >60)
Glucose, Bld: 155 mg/dL — ABNORMAL HIGH (ref 65–99)
Potassium: 4.3 mmol/L (ref 3.5–5.1)
SODIUM: 140 mmol/L (ref 135–145)
Total Bilirubin: 0.5 mg/dL (ref 0.3–1.2)
Total Protein: 8 g/dL (ref 6.5–8.1)

## 2016-11-02 LAB — CBC
HEMATOCRIT: 37.1 % — AB (ref 40.0–52.0)
HEMOGLOBIN: 12.4 g/dL — AB (ref 13.0–18.0)
MCH: 28.6 pg (ref 26.0–34.0)
MCHC: 33.4 g/dL (ref 32.0–36.0)
MCV: 85.8 fL (ref 80.0–100.0)
PLATELETS: 238 10*3/uL (ref 150–440)
RBC: 4.33 MIL/uL — AB (ref 4.40–5.90)
RDW: 14 % (ref 11.5–14.5)
WBC: 14.7 10*3/uL — AB (ref 3.8–10.6)

## 2016-11-02 LAB — LIPASE, BLOOD: LIPASE: 34 U/L (ref 11–51)

## 2016-11-02 MED ORDER — SODIUM CHLORIDE 0.9 % IV BOLUS (SEPSIS): 1000.0000 mL | Freq: Once | INTRAVENOUS | Status: DC

## 2016-11-02 MED ORDER — PROMETHAZINE HCL 25 MG/ML IJ SOLN
12.5000 mg | Freq: Four times a day (QID) | INTRAMUSCULAR | Status: DC | PRN
Start: 2016-11-02 — End: 2016-11-03

## 2016-11-02 MED ORDER — MORPHINE SULFATE (PF) 4 MG/ML IV SOLN: 4.0000 mg | INTRAVENOUS | Status: DC | PRN

## 2016-11-02 NOTE — ED Provider Notes (Signed)
[facility] Emergency Department Provider Note    First MD Initiated Contact with Patient 11/02/16 2347     (approximate)  I have reviewed the triage vital signs and the nursing notes.   HISTORY  Chief Complaint Abdominal Pain    HPI Anthony Hardenbrook. is a 56 y.o. male chief complaint of epigastric crampy abdominal pain associated with 3 episodes of nonbilious nonbloody vomiting. Has been having associated watery diarrhea today. No fevers or chills. States he had similar episode once prior after he ate an orange and he ate an orange today. Is concerned that this may have been a reaction to some of his medications.  He denies any chest pain or shortness of breath with this. No changes to any medications at home. Has not been able to keep anything down. Is complaining of 10 out of 10 crampy abdominal pain. Denies any recent alcohol ingestion. Still has his gallbladder. No history of cirrhosis or liver disease.   Past Medical History:  Diagnosis Date  . Enlarged prostate   . Hyperlipidemia   . Hypertension   . IFG (impaired fasting glucose)   . Vitamin D deficiency    Family History  Problem Relation Age of Onset  . Hypertension Mother   . Alzheimer's disease Father   . Hypertension Brother   . Cancer Maternal Grandfather        Prostate  . Cancer Sister        Breast Cancer   History reviewed. No pertinent surgical history. Patient Active Problem List   Diagnosis Date Noted  . Chronic kidney disease 08/23/2015  . Sleep apnea 08/20/2015  . Hyperlipidemia   . Benign hypertensive renal disease   . IFG (impaired fasting glucose)   . Vitamin D deficiency   . Enlarged prostate       Prior to Admission medications   Medication Sig Start Date End Date Taking? Authorizing Provider  amLODipine-atorvastatin (CADUET) 5-40 MG tablet Take 1 tablet by mouth daily. 07/04/16   Johnson, [name], DO  chlorthalidone (HYGROTON) 25 MG tablet Take 1 tablet  (25 mg total) by mouth daily. 07/04/16   Johnson, [name], DO  hydrALAZINE (APRESOLINE) 100 MG tablet Take 1 tablet (100 mg total) by mouth 2 (two) times daily. 07/04/16   Johnson, [name], DO  lisinopril (PRINIVIL,ZESTRIL) 40 MG tablet Take 1 tablet (40 mg total) by mouth daily. 07/04/16   Johnson, [name], DO  metoprolol succinate (TOPROL-XL) 50 MG 24 hr tablet Take 2 tablets (100 mg total) by mouth daily. Take with or immediately following a meal. 07/04/16   Johnson, [name], DO  spironolactone (ALDACTONE) 25 MG tablet Take 1 tablet (25 mg total) by mouth 2 (two) times daily. 07/04/16   Valerie Roys, DO    Allergies Patient has no known allergies.    Social History Social History  Substance Use Topics  . Smoking status: Former Smoker    Packs/day: 0.25    Types: Cigarettes  . Smokeless tobacco: Never Used  . Alcohol use No    Review of Systems Patient denies headaches, rhinorrhea, blurry vision, numbness, shortness of breath, chest pain, edema, cough, abdominal pain, nausea, vomiting, diarrhea, dysuria, fevers, rashes or hallucinations unless otherwise stated above in HPI. ____________________________________________   PHYSICAL EXAM:  VITAL SIGNS: Vitals:   11/02/16 2313  BP: (!) 148/100  Pulse: 92  Resp: 20  Temp: 98 F (36.7 C)  SpO2: 98%    Constitutional: Alert and oriented.  in no acute distress. Eyes: Conjunctivae are normal.  Head: Atraumatic. Nose: No congestion/rhinnorhea. Mouth/Throat: Mucous membranes are moist.   Neck: No stridor. Painless ROM.  Cardiovascular: Normal rate, regular rhythm. Grossly normal heart sounds.  Good peripheral circulation. Respiratory: Normal respiratory effort.  No retractions. Lungs CTAB. Gastrointestinal: Soft with mild epigastric tenderness, no rebound or guarding. No distention. No abdominal bruits. No CVA tenderness. Genitourinary:  Musculoskeletal: No lower extremity tenderness nor edema.  No joint effusions. Neurologic:   Normal speech and language. No gross focal neurologic deficits are appreciated. No facial droop Skin:  Skin is warm, dry and intact. No rash noted. Psychiatric: Mood and affect are normal. Speech and behavior are normal.  ____________________________________________   LABS (all labs ordered are listed, but only abnormal results are displayed)  Results for orders placed or performed during the hospital encounter of 11/02/16 (from the past 24 hour(s))  Lipase, blood     Status: None   Collection Time: 11/02/16 11:08 PM  Result Value Ref Range   Lipase 34 11 - 51 U/L  Comprehensive metabolic panel     Status: Abnormal   Collection Time: 11/02/16 11:08 PM  Result Value Ref Range   Sodium 140 135 - 145 mmol/L   Potassium 4.3 3.5 - 5.1 mmol/L   Chloride 111 101 - 111 mmol/L   CO2 19 (L) 22 - 32 mmol/L   Glucose, Bld 155 (H) 65 - 99 mg/dL   BUN 53 (H) 6 - 20 mg/dL   Creatinine, Ser 2.27 (H) 0.61 - 1.24 mg/dL   Calcium 9.1 8.9 - 10.3 mg/dL   Total Protein 8.0 6.5 - 8.1 g/dL   Albumin 4.4 3.5 - 5.0 g/dL   AST 22 15 - 41 U/L   ALT 22 17 - 63 U/L   Alkaline Phosphatase 62 38 - 126 U/L   Total Bilirubin 0.5 0.3 - 1.2 mg/dL   GFR calc non Af Amer 31 (L) >60 mL/min   GFR calc Af Amer 36 (L) >60 mL/min   Anion gap 10 5 - 15  CBC     Status: Abnormal   Collection Time: 11/02/16 11:08 PM  Result Value Ref Range   WBC 14.7 (H) 3.8 - 10.6 K/uL   RBC 4.33 (L) 4.40 - 5.90 MIL/uL   Hemoglobin 12.4 (L) 13.0 - 18.0 g/dL   HCT 37.1 (L) 40.0 - 52.0 %   MCV 85.8 80.0 - 100.0 fL   MCH 28.6 26.0 - 34.0 pg   MCHC 33.4 32.0 - 36.0 g/dL   RDW 14.0 11.5 - 14.5 %   Platelets 238 150 - 440 K/uL   ____________________________________________  EKG My review and personal interpretation at Time: 23:10   Indication: epigastric pain  Rate: 85  Rhythm: sinus Axis: normal Other: no stemi, normal intervals, occasional pvc ____________________________________________  RADIOLOGY  I personally reviewed  all radiographic images ordered to evaluate for the above acute complaints and reviewed radiology reports and findings.  These findings were personally discussed with the patient.  Please see medical record for radiology report.  ____________________________________________   PROCEDURES  Procedure(s) performed:  Procedures    Critical Care performed: no ____________________________________________   INITIAL IMPRESSION / ASSESSMENT AND PLAN / ED COURSE  Pertinent labs & imaging results that were available during my care of the patient were reviewed by me and considered in my medical decision making (see chart for details).  DDX: gastritis, pancreatitis, cholecystitis, cholelithiasis, acs, pna, perf, colitis, enteritis  Anthony F Casimir Sr.  is a 56 y.o. who presents to the ED with epigastric burning discomfort associated with vomiting and diarrhea. No lower abdominal pain to suggest appendicitis or diverticulitis. We'll order ultrasound evaluate for evidence of cholelithiasis or cholecystitis. Lipase is normal therefore a lower suspicion for pancreatitis. Based on symptoms and recurrent episode seems more likely consistent with gastritis. Less consistent with ACS or cardiac etiology as he has nonischemic EKG and troponin is negative. Also denies any chest pain or shortness of breath.  The patient will be placed on continuous pulse oximetry and telemetry for monitoring.  Laboratory evaluation will be sent to evaluate for the above complaints.     Clinical Course as of Nov 03 133  Fri Nov 03, 2016  0111 Ultrasound shows no evidence of acute biliary pathology./Consistent with pancreatitis port. Remains hemodynamic stable. IV fluids infusing for dehydration. We'll trial of GI cocktail as I do suspect some component of gastritis.  [PR]  0131 Patient requesting discharge at this time. Patient does not receive any medications or IV fluids. Will have patient perform a PO challenge.  Repeat  abdominal exam is soft and benign.  [PR]    Clinical Course User Index [PR] Merlyn Lot, MD    Patient was able to tolerate PO.  Again requesting DC home.  As his repeat abdominal exam is soft and benign with complete resolution of his symptoms, I do believe he is stable and appropriate for further workup as an outpatient.  Have discussed with the patient and available family all diagnostics and treatments performed thus far and all questions were answered to the best of my ability. The patient demonstrates understanding and agreement with plan.   ____________________________________________   FINAL CLINICAL IMPRESSION(S) / ED DIAGNOSES  Final diagnoses:  Epigastric pain      NEW MEDICATIONS STARTED DURING THIS VISIT:  New Prescriptions   No medications on file     Note:  This document was prepared using Dragon voice recognition software and may include unintentional dictation errors.    Merlyn Lot, MD 11/03/16 [phone]

## 2016-11-02 NOTE — ED Triage Notes (Signed)
Pt complains of abdominal pain starting today, pt reports 3 episodes of vomiting, pt reports upper abdominal pain

## 2016-11-03 ENCOUNTER — Emergency Department: Payer: 59

## 2016-11-03 LAB — TROPONIN I: Troponin I: <0.03 ng/mL (ref <0.03)

## 2016-11-03 MED ORDER — GI COCKTAIL ~~LOC~~: 30.0000 mL | Freq: Once | ORAL | Status: DC

## 2016-11-03 MED ORDER — RANITIDINE HCL 150 MG PO TABS: 150.0000 mg | ORAL_TABLET | Freq: Every day | ORAL | 1 refills | Status: DC

## 2016-11-03 MED ORDER — RANITIDINE HCL 150 MG PO TABS: 150.0000 mg | ORAL_TABLET | Freq: Two times a day (BID) | ORAL | 1 refills | Status: DC

## 2016-11-03 NOTE — Discharge Instructions (Signed)

## 2016-11-03 NOTE — ED Notes (Signed)

## 2016-12-25 ENCOUNTER — Other Ambulatory Visit: Payer: Self-pay | Admitting: Family Medicine

## 2016-12-25 DIAGNOSIS — I129 Hypertensive chronic kidney disease with stage 1 through stage 4 chronic kidney disease, or unspecified chronic kidney disease: Secondary | ICD-10-CM

## 2017-01-03 ENCOUNTER — Ambulatory Visit: Payer: BLUE CROSS/BLUE SHIELD | Admitting: Family Medicine

## 2017-01-30 ENCOUNTER — Other Ambulatory Visit: Payer: Self-pay | Admitting: Family Medicine

## 2017-03-25 ENCOUNTER — Other Ambulatory Visit: Payer: Self-pay | Admitting: Family Medicine

## 2017-03-26 NOTE — Telephone Encounter (Signed)
LOV 07/04/16 with Dr. Wynetta Emery /

## 2017-03-26 NOTE — Telephone Encounter (Signed)
LOV 07/04/16 with Dr. Wynetta Emery / Refill request for Chlorthalidone / Will refill per protocol and give patient a 90 day supply with no refills.  Patient will need an F/U appointment at that time /

## 2017-04-27 ENCOUNTER — Other Ambulatory Visit: Payer: Self-pay | Admitting: Family Medicine

## 2017-04-27 NOTE — Telephone Encounter (Signed)
Lisinopril 40 mg tablet refill request  LOV 07/04/16 with Dr. Wynetta Emery  [facility] [address], [place] - [phone]

## 2017-05-01 ENCOUNTER — Other Ambulatory Visit: Payer: Self-pay

## 2017-05-01 ENCOUNTER — Emergency Department
Admission: EM | Admit: 2017-05-01 | Discharge: 2017-05-01 | Disposition: A | Discharge status: Home / Self Care | Payer: 59 | Attending: Emergency Medicine | Admitting: Emergency Medicine

## 2017-05-01 DIAGNOSIS — R109 Unspecified abdominal pain: Secondary | ICD-10-CM | POA: Insufficient documentation

## 2017-05-01 DIAGNOSIS — Z5321 Procedure and treatment not carried out due to patient leaving prior to being seen by health care provider: Secondary | ICD-10-CM | POA: Insufficient documentation

## 2017-05-01 LAB — COMPREHENSIVE METABOLIC PANEL
ALBUMIN: 4.7 g/dL (ref 3.5–5.0)
ALK PHOS: 70 U/L (ref 38–126)
ALT: 35 U/L (ref 17–63)
ANION GAP: 12 (ref 5–15)
AST: 38 U/L (ref 15–41)
BUN: 32 mg/dL — ABNORMAL HIGH (ref 6–20)
CALCIUM: 9.2 mg/dL (ref 8.9–10.3)
CHLORIDE: 105 mmol/L (ref 101–111)
CO2: 20 mmol/L — AB (ref 22–32)
Creatinine, Ser: 1.89 mg/dL — ABNORMAL HIGH (ref 0.61–1.24)
GFR calc Af Amer: 44 mL/min — ABNORMAL LOW (ref >60)
GFR calc non Af Amer: 38 mL/min — ABNORMAL LOW (ref >60)
GLUCOSE: 170 mg/dL — AB (ref 65–99)
POTASSIUM: 4.5 mmol/L (ref 3.5–5.1)
SODIUM: 137 mmol/L (ref 135–145)
Total Bilirubin: 0.7 mg/dL (ref 0.3–1.2)
Total Protein: 8.2 g/dL — ABNORMAL HIGH (ref 6.5–8.1)

## 2017-05-01 LAB — CBC
HEMATOCRIT: 40 % (ref 40.0–52.0)
HEMOGLOBIN: 12.9 g/dL — AB (ref 13.0–18.0)
MCH: 27.8 pg (ref 26.0–34.0)
MCHC: 32.3 g/dL (ref 32.0–36.0)
MCV: 86.3 fL (ref 80.0–100.0)
Platelets: 245 10*3/uL (ref 150–440)
RBC: 4.64 MIL/uL (ref 4.40–5.90)
RDW: 14.3 % (ref 11.5–14.5)
WBC: 15.9 10*3/uL — ABNORMAL HIGH (ref 3.8–10.6)

## 2017-05-01 LAB — URINALYSIS, COMPLETE (UACMP) WITH MICROSCOPIC
BACTERIA UA: NONE SEEN
BILIRUBIN URINE: NEGATIVE
Glucose, UA: NEGATIVE mg/dL
Ketones, ur: NEGATIVE mg/dL
Leukocytes, UA: NEGATIVE
Nitrite: NEGATIVE
Protein, ur: 100 mg/dL — AB
Specific Gravity, Urine: 1.014 (ref 1.005–1.030)
Squamous Epithelial / LPF: NONE SEEN
pH: 5 (ref 5.0–8.0)

## 2017-05-01 LAB — LIPASE, BLOOD: LIPASE: 32 U/L (ref 11–51)

## 2017-05-01 NOTE — ED Triage Notes (Signed)
Pt c/o epigastric pain since around 11am today with N/V.Anthony Sellers States he has not even been able to keep is antiacid meds down.Anthony Sellers

## 2017-06-30 ENCOUNTER — Other Ambulatory Visit: Payer: Self-pay | Admitting: Family Medicine

## 2017-07-02 NOTE — Telephone Encounter (Signed)
Pharmacy requesting directions, refills and quantity.   This no longer covered; alternative requested  Amlodipine 5 mg tablet   Dr. Park Liter  [facility] [address] [place] [address]

## 2017-07-02 NOTE — Telephone Encounter (Signed)
If it's not covered, he can get it for $4 at [facility]. If he still can't get it, he will need an appointment. I cannot change that without seeing him.

## 2017-07-02 NOTE — Telephone Encounter (Signed)
Left message on machine for pt to return call to the office.  

## 2017-07-12 ENCOUNTER — Encounter: Payer: Self-pay | Admitting: Internal Medicine

## 2017-07-13 ENCOUNTER — Other Ambulatory Visit: Payer: Self-pay | Admitting: Family Medicine

## 2017-07-13 NOTE — Telephone Encounter (Signed)
Copied from [place] [phone]. Topic: Quick Communication - Rx Refill/Question >> Jul 13, 2017  5:13 PM Oliver Pila B wrote: Medication: amLODipine-atorvastatin (CADUET) 5-40 MG tablet [[id number]]   Pharmacy called to state the medication above is not covered by insurance but if prescribed separately the insurance will cover, call pharmacy if needed

## 2017-07-16 MED ORDER — ATORVASTATIN CALCIUM 40 MG PO TABS: 40.0000 mg | ORAL_TABLET | Freq: Every day | ORAL | 1 refills | Status: DC

## 2017-07-16 MED ORDER — AMLODIPINE BESYLATE 5 MG PO TABS: 5.0000 mg | ORAL_TABLET | Freq: Every day | ORAL | 1 refills | Status: DC

## 2017-07-16 MED ORDER — AMLODIPINE-ATORVASTATIN 5-40 MG PO TABS: 1.0000 | ORAL_TABLET | Freq: Every day | ORAL | 1 refills | Status: DC

## 2017-07-16 NOTE — Addendum Note (Signed)
Addended by: Valerie Roys on: 07/16/2017 04:54 PM   Modules accepted: Orders

## 2017-07-16 NOTE — Telephone Encounter (Signed)
Patient's wife called and said that she contacted the insurance company and there are 2 other medications he could take in place of this that they would pay for. He has been without his medication for a week.   Atorvastatin ( did not know the mg ) Amlodipin- besylate ( did not know the mg )

## 2017-07-19 ENCOUNTER — Other Ambulatory Visit: Payer: Self-pay | Admitting: Family Medicine

## 2017-07-28 ENCOUNTER — Other Ambulatory Visit: Payer: Self-pay | Admitting: Family Medicine

## 2017-07-28 DIAGNOSIS — I129 Hypertensive chronic kidney disease with stage 1 through stage 4 chronic kidney disease, or unspecified chronic kidney disease: Secondary | ICD-10-CM

## 2017-07-30 NOTE — Telephone Encounter (Signed)
Patient called and wife answered the phone, left message with her to have the patient call back to discuss the refill request, she verbalized she will tell him when he gets off work today.

## 2017-07-30 NOTE — Telephone Encounter (Signed)
Needs appointment

## 2017-07-31 ENCOUNTER — Encounter: Payer: Self-pay | Admitting: Family Medicine

## 2017-07-31 NOTE — Telephone Encounter (Signed)
Letter printed to be mailed.  °

## 2017-07-31 NOTE — Telephone Encounter (Signed)
Patient called, left VM to return call to the office. Patient will need an appointment before medication is refilled.

## 2017-09-19 ENCOUNTER — Other Ambulatory Visit: Payer: Self-pay | Admitting: Family Medicine

## 2017-09-19 DIAGNOSIS — I129 Hypertensive chronic kidney disease with stage 1 through stage 4 chronic kidney disease, or unspecified chronic kidney disease: Secondary | ICD-10-CM

## 2017-10-10 ENCOUNTER — Other Ambulatory Visit: Payer: Self-pay | Admitting: Family Medicine

## 2017-10-12 ENCOUNTER — Other Ambulatory Visit: Payer: Self-pay | Admitting: Family Medicine

## 2017-11-02 ENCOUNTER — Ambulatory Visit (INDEPENDENT_AMBULATORY_CARE_PROVIDER_SITE_OTHER): Payer: 59 | Admitting: Family Medicine

## 2017-11-02 ENCOUNTER — Encounter: Payer: Self-pay | Admitting: Family Medicine

## 2017-11-02 ENCOUNTER — Other Ambulatory Visit: Payer: Self-pay

## 2017-11-02 VITALS — BP 131/81 | HR 90 | Temp 98.6°F | Ht 69.1 in | Wt 225.0 lb

## 2017-11-02 DIAGNOSIS — Z1211 Encounter for screening for malignant neoplasm of colon: Secondary | ICD-10-CM

## 2017-11-02 DIAGNOSIS — N183 Chronic kidney disease, stage 3 unspecified: Secondary | ICD-10-CM

## 2017-11-02 DIAGNOSIS — R7301 Impaired fasting glucose: Secondary | ICD-10-CM | POA: Diagnosis not present

## 2017-11-02 DIAGNOSIS — N400 Enlarged prostate without lower urinary tract symptoms: Secondary | ICD-10-CM

## 2017-11-02 DIAGNOSIS — I129 Hypertensive chronic kidney disease with stage 1 through stage 4 chronic kidney disease, or unspecified chronic kidney disease: Secondary | ICD-10-CM | POA: Diagnosis not present

## 2017-11-02 DIAGNOSIS — Z114 Encounter for screening for human immunodeficiency virus [HIV]: Secondary | ICD-10-CM

## 2017-11-02 DIAGNOSIS — E782 Mixed hyperlipidemia: Secondary | ICD-10-CM

## 2017-11-02 DIAGNOSIS — Z1159 Encounter for screening for other viral diseases: Secondary | ICD-10-CM

## 2017-11-02 DIAGNOSIS — E559 Vitamin D deficiency, unspecified: Secondary | ICD-10-CM

## 2017-11-02 LAB — UA/M W/RFLX CULTURE, ROUTINE
Bilirubin, UA: NEGATIVE
GLUCOSE, UA: NEGATIVE
KETONES UA: NEGATIVE
Leukocytes, UA: NEGATIVE
NITRITE UA: NEGATIVE
PROTEIN UA: NEGATIVE
RBC UA: NEGATIVE
Specific Gravity, UA: 1.025 (ref 1.005–1.030)
Urobilinogen, Ur: 0.2 mg/dL (ref 0.2–1.0)
pH, UA: 5 (ref 5.0–7.5)

## 2017-11-02 LAB — MICROALBUMIN, URINE WAIVED
CREATININE, URINE WAIVED: 300 mg/dL (ref 10–300)
Microalb, Ur Waived: 30 mg/L — ABNORMAL HIGH (ref 0–19)

## 2017-11-02 LAB — BAYER DCA HB A1C WAIVED: HB A1C (BAYER DCA - WAIVED): 6.2 % (ref <7.0)

## 2017-11-02 MED ORDER — SPIRONOLACTONE 25 MG PO TABS: 25.0000 mg | ORAL_TABLET | Freq: Two times a day (BID) | ORAL | 1 refills | Status: DC

## 2017-11-02 MED ORDER — TRIAMCINOLONE ACETONIDE 0.5 % EX OINT: 1.0000 "application " | TOPICAL_OINTMENT | Freq: Two times a day (BID) | CUTANEOUS | 0 refills | Status: DC

## 2017-11-02 MED ORDER — ATORVASTATIN CALCIUM 40 MG PO TABS: 40.0000 mg | ORAL_TABLET | Freq: Every day | ORAL | 1 refills | Status: DC

## 2017-11-02 MED ORDER — HYDRALAZINE HCL 100 MG PO TABS: 100.0000 mg | ORAL_TABLET | Freq: Two times a day (BID) | ORAL | 1 refills | Status: DC

## 2017-11-02 MED ORDER — CHLORTHALIDONE 25 MG PO TABS: 25.0000 mg | ORAL_TABLET | Freq: Every day | ORAL | 1 refills | Status: DC

## 2017-11-02 MED ORDER — LISINOPRIL 40 MG PO TABS: 40.0000 mg | ORAL_TABLET | Freq: Every day | ORAL | 1 refills | Status: DC

## 2017-11-02 NOTE — Assessment & Plan Note (Signed)
Rechecking levels today. Treat as needed.

## 2017-11-02 NOTE — Progress Notes (Signed)
BP 131/81   Pulse 90   Temp 98.6 F (37 C) (Oral)   Ht 5' 9.1" (1.755 m)   Wt 225 lb (102.1 kg)   SpO2 97%   BMI 33.13 kg/m    Subjective:    Patient ID: Anthony Sellers., male    DOB: 06/06/1960, 57 y.o.   MRN: [id number]  HPI: Kamen Hanken. is a 57 y.o. male  Chief Complaint  Patient presents with  . Hypertension    f/u  . Hyperlipidemia  . IFG   HYPERTENSION / HYPERLIPIDEMIA Satisfied with current treatment? yes Duration of hypertension: chronic BP monitoring frequency: 1x a week BP range: unknown BP medication side effects: no Past BP meds: chlorthalidone, hydralzine, lisinopril, spironalactone Duration of hyperlipidemia: chronic Cholesterol medication side effects: Not on anything Cholesterol supplements: none Past cholesterol medications: none Medication compliance: excellent compliance Aspirin: no Recent stressors: no Recurrent headaches: no Visual changes: no Palpitations: no Dyspnea: no Chest pain: no Lower extremity edema: no Dizzy/lightheaded: no  Impaired Fasting Glucose HbA1C:  Lab Results  Component Value Date   HGBA1C 6.1 (A) 10/08/2014   Duration of elevated blood sugar: chronic Polydipsia: no Polyuria: no Weight change: no Visual disturbance: no Glucose Monitoring: no Diabetic Education: Not Completed Family history of diabetes: yes  Relevant past medical, surgical, family and social history reviewed and updated as indicated. Interim medical history since our last visit reviewed. Allergies and medications reviewed and updated.  Review of Systems  Constitutional: Negative.   Respiratory: Negative.   Cardiovascular: Negative.   Skin: Positive for rash. Negative for color change, pallor and wound.  Psychiatric/Behavioral: Negative.     Per HPI unless specifically indicated above     Objective:    BP 131/81   Pulse 90   Temp 98.6 F (37 C) (Oral)   Ht 5' 9.1" (1.755 m)   Wt 225 lb (102.1 kg)   SpO2 97%   BMI  33.13 kg/m   Wt Readings from Last 3 Encounters:  11/02/17 225 lb (102.1 kg)  05/01/17 230 lb (104.3 kg)  11/02/16 230 lb (104.3 kg)    Physical Exam  Constitutional: He is oriented to person, place, and time. He appears well-developed and well-nourished. No distress.  HENT:  Head: Normocephalic and atraumatic.  Right Ear: Hearing normal.  Left Ear: Hearing normal.  Nose: Nose normal.  Eyes: Conjunctivae and lids are normal. Right eye exhibits no discharge. Left eye exhibits no discharge. No scleral icterus.  Cardiovascular: Normal rate, regular rhythm, normal heart sounds and intact distal pulses. Exam reveals no gallop and no friction rub.  No murmur heard. Pulmonary/Chest: Effort normal and breath sounds normal. No stridor. No respiratory distress. He has no wheezes. He has no rales. He exhibits no tenderness.  Musculoskeletal: Normal range of motion.  Neurological: He is alert and oriented to person, place, and time.  Skin: Skin is warm, dry and intact. Capillary refill takes less than 2 seconds. No rash noted. He is not diaphoretic. No erythema. No pallor.  Psychiatric: He has a normal mood and affect. His speech is normal and behavior is normal. Judgment and thought content normal. Cognition and memory are normal.  Nursing note and vitals reviewed.   Results for orders placed or performed during the hospital encounter of 05/01/17  Lipase, blood  Result Value Ref Range   Lipase 32 11 - 51 U/L  Comprehensive metabolic panel  Result Value Ref Range   Sodium 137 135 - 145 mmol/L  Potassium 4.5 3.5 - 5.1 mmol/L   Chloride 105 101 - 111 mmol/L   CO2 20 (L) 22 - 32 mmol/L   Glucose, Bld 170 (H) 65 - 99 mg/dL   BUN 32 (H) 6 - 20 mg/dL   Creatinine, Ser 1.89 (H) 0.61 - 1.24 mg/dL   Calcium 9.2 8.9 - 10.3 mg/dL   Total Protein 8.2 (H) 6.5 - 8.1 g/dL   Albumin 4.7 3.5 - 5.0 g/dL   AST 38 15 - 41 U/L   ALT 35 17 - 63 U/L   Alkaline Phosphatase 70 38 - 126 U/L   Total Bilirubin  0.7 0.3 - 1.2 mg/dL   GFR calc non Af Amer 38 (L) >60 mL/min   GFR calc Af Amer 44 (L) >60 mL/min   Anion gap 12 5 - 15  CBC  Result Value Ref Range   WBC 15.9 (H) 3.8 - 10.6 K/uL   RBC 4.64 4.40 - 5.90 MIL/uL   Hemoglobin 12.9 (L) 13.0 - 18.0 g/dL   HCT 40.0 40.0 - 52.0 %   MCV 86.3 80.0 - 100.0 fL   MCH 27.8 26.0 - 34.0 pg   MCHC 32.3 32.0 - 36.0 g/dL   RDW 14.3 11.5 - 14.5 %   Platelets 245 150 - 440 K/uL  Urinalysis, Complete w Microscopic  Result Value Ref Range   Color, Urine YELLOW (A) YELLOW   APPearance CLEAR (A) CLEAR   Specific Gravity, Urine 1.014 1.005 - 1.030   pH 5.0 5.0 - 8.0   Glucose, UA NEGATIVE NEGATIVE mg/dL   Hgb urine dipstick SMALL (A) NEGATIVE   Bilirubin Urine NEGATIVE NEGATIVE   Ketones, ur NEGATIVE NEGATIVE mg/dL   Protein, ur 100 (A) NEGATIVE mg/dL   Nitrite NEGATIVE NEGATIVE   Leukocytes, UA NEGATIVE NEGATIVE   RBC / HPF 0-5 0 - 5 RBC/hpf   WBC, UA 0-5 0 - 5 WBC/hpf   Bacteria, UA NONE SEEN NONE SEEN   Squamous Epithelial / LPF NONE SEEN NONE SEEN   Hyaline Casts, UA PRESENT       Assessment & Plan:   Problem List Items Addressed This Visit      Endocrine   IFG (impaired fasting glucose)    Stable at 6.2. Continue current regimen. Call with any concerns.       Relevant Orders   CBC with Differential/Platelet   Comprehensive metabolic panel   Microalbumin, Urine Waived   TSH   UA/M w/rflx Culture, Routine   Bayer DCA Hb A1c Waived     Genitourinary   Benign hypertensive renal disease - Primary    Under good control on current regimen. Continue current regimen. Continue to monitor. Call with any concerns. Refills given.        Relevant Orders   CBC with Differential/Platelet   Comprehensive metabolic panel   Microalbumin, Urine Waived   TSH   UA/M w/rflx Culture, Routine   Chronic kidney disease    Rechecking levels today. Call with any concerns. Continue to follow with nephrology. Call with any concerns.       Relevant  Orders   CBC with Differential/Platelet   Comprehensive metabolic panel   TSH   UA/M w/rflx Culture, Routine     Other   Hyperlipidemia    Off his atorvastatin. Will restart it. Call with any concerns.       Relevant Medications   chlorthalidone (HYGROTON) 25 MG tablet   hydrALAZINE (APRESOLINE) 100 MG tablet   lisinopril (PRINIVIL,ZESTRIL) 40  MG tablet   spironolactone (ALDACTONE) 25 MG tablet   atorvastatin (LIPITOR) 40 MG tablet   Other Relevant Orders   CBC with Differential/Platelet   Comprehensive metabolic panel   Lipid Panel w/o Chol/HDL Ratio   TSH   UA/M w/rflx Culture, Routine   Vitamin D deficiency    Rechecking levels today. Treat as needed.       Relevant Orders   CBC with Differential/Platelet   Comprehensive metabolic panel   TSH   UA/M w/rflx Culture, Routine   VITAMIN D 25 Hydroxy (Vit-D Deficiency, Fractures)   Enlarged prostate    Checking PSA today.      Relevant Orders   CBC with Differential/Platelet   Comprehensive metabolic panel   PSA   TSH   UA/M w/rflx Culture, Routine    Other Visit Diagnoses    Colon cancer screening       Referral to GI made today.   Relevant Orders   Ambulatory referral to Gastroenterology   Need for hepatitis C screening test       Labs drawn today. Await results.    Relevant Orders   Hepatitis C antibody   Encounter for screening for HIV       Labs drawn today. Await results.    Relevant Orders   HIV Antibody (routine testing w rflx)       Follow up plan: Return in about 6 months (around 05/04/2018) for Physical.

## 2017-11-02 NOTE — Assessment & Plan Note (Signed)
Checking PSA today.

## 2017-11-02 NOTE — Assessment & Plan Note (Signed)
Stable at 6.2. Continue current regimen. Call with any concerns.

## 2017-11-02 NOTE — Assessment & Plan Note (Signed)
Rechecking levels today. Call with any concerns. Continue to follow with nephrology. Call with any concerns.

## 2017-11-02 NOTE — Assessment & Plan Note (Signed)
Under good control on current regimen. Continue current regimen. Continue to monitor. Call with any concerns. Refills given.   

## 2017-11-02 NOTE — Assessment & Plan Note (Signed)
Off his atorvastatin. Will restart it. Call with any concerns.

## 2017-11-03 ENCOUNTER — Other Ambulatory Visit: Payer: Self-pay | Admitting: Family Medicine

## 2017-11-03 DIAGNOSIS — I129 Hypertensive chronic kidney disease with stage 1 through stage 4 chronic kidney disease, or unspecified chronic kidney disease: Secondary | ICD-10-CM

## 2017-11-03 LAB — COMPREHENSIVE METABOLIC PANEL
ALBUMIN: 4.2 g/dL (ref 3.5–5.5)
ALK PHOS: 69 IU/L (ref 39–117)
ALT: 24 IU/L (ref 0–44)
AST: 23 IU/L (ref 0–40)
Albumin/Globulin Ratio: 1.6 (ref 1.2–2.2)
BUN / CREAT RATIO: 12 (ref 9–20)
BUN: 21 mg/dL (ref 6–24)
CHLORIDE: 102 mmol/L (ref 96–106)
CO2: 19 mmol/L — ABNORMAL LOW (ref 20–29)
Calcium: 9.2 mg/dL (ref 8.7–10.2)
Creatinine, Ser: 1.77 mg/dL — ABNORMAL HIGH (ref 0.76–1.27)
GFR calc Af Amer: 49 mL/min/{1.73_m2} — ABNORMAL LOW (ref >59)
GFR calc non Af Amer: 42 mL/min/{1.73_m2} — ABNORMAL LOW (ref >59)
Globulin, Total: 2.6 g/dL (ref 1.5–4.5)
Glucose: 168 mg/dL — ABNORMAL HIGH (ref 65–99)
POTASSIUM: 4.1 mmol/L (ref 3.5–5.2)
Sodium: 140 mmol/L (ref 134–144)
Total Protein: 6.8 g/dL (ref 6.0–8.5)

## 2017-11-03 LAB — CBC WITH DIFFERENTIAL/PLATELET
BASOS ABS: 0 10*3/uL (ref 0.0–0.2)
Basos: 1 %
EOS (ABSOLUTE): 0.3 10*3/uL (ref 0.0–0.4)
Eos: 5 %
HEMOGLOBIN: 11.4 g/dL — AB (ref 13.0–17.7)
Hematocrit: 34.6 % — ABNORMAL LOW (ref 37.5–51.0)
Immature Grans (Abs): 0.1 10*3/uL (ref 0.0–0.1)
Immature Granulocytes: 1 %
LYMPHS ABS: 1.2 10*3/uL (ref 0.7–3.1)
Lymphs: 19 %
MCH: 27.7 pg (ref 26.6–33.0)
MCHC: 32.9 g/dL (ref 31.5–35.7)
MCV: 84 fL (ref 79–97)
Monocytes Absolute: 0.6 10*3/uL (ref 0.1–0.9)
Monocytes: 9 %
NEUTROS ABS: 4.2 10*3/uL (ref 1.4–7.0)
Neutrophils: 65 %
PLATELETS: 217 10*3/uL (ref 150–450)
RBC: 4.11 x10E6/uL — ABNORMAL LOW (ref 4.14–5.80)
RDW: 13.3 % (ref 12.3–15.4)
WBC: 6.2 10*3/uL (ref 3.4–10.8)

## 2017-11-03 LAB — LIPID PANEL W/O CHOL/HDL RATIO
CHOLESTEROL TOTAL: 198 mg/dL (ref 100–199)
HDL: 36 mg/dL — ABNORMAL LOW (ref >39)
LDL Calculated: 127 mg/dL — ABNORMAL HIGH (ref 0–99)
Triglycerides: 173 mg/dL — ABNORMAL HIGH (ref 0–149)
VLDL CHOLESTEROL CAL: 35 mg/dL (ref 5–40)

## 2017-11-03 LAB — PSA: Prostate Specific Ag, Serum: 1.2 ng/mL (ref 0.0–4.0)

## 2017-11-03 LAB — TSH: TSH: 1.77 u[IU]/mL (ref 0.450–4.500)

## 2017-11-03 LAB — VITAMIN D 25 HYDROXY (VIT D DEFICIENCY, FRACTURES): Vit D, 25-Hydroxy: 23.2 ng/mL — ABNORMAL LOW (ref 30.0–100.0)

## 2017-11-05 ENCOUNTER — Telehealth: Payer: Self-pay | Admitting: Family Medicine

## 2017-11-05 DIAGNOSIS — D649 Anemia, unspecified: Secondary | ICD-10-CM

## 2017-11-05 NOTE — Telephone Encounter (Signed)
Please let him know that his labs came back normal except he's a little anemic. I'd like him to come back just for an office visit in 1 month for [place] to recheck his blood count. Orders in.

## 2017-11-06 NOTE — Telephone Encounter (Signed)
Called patient, no answer, will try again. [phone]

## 2017-11-06 NOTE — Telephone Encounter (Signed)
Patient notified of lab results

## 2017-11-07 ENCOUNTER — Other Ambulatory Visit: Payer: Self-pay

## 2017-11-07 ENCOUNTER — Other Ambulatory Visit: Payer: Self-pay | Admitting: Family Medicine

## 2017-11-07 DIAGNOSIS — Z114 Encounter for screening for human immunodeficiency virus [HIV]: Secondary | ICD-10-CM

## 2017-11-07 DIAGNOSIS — Z1211 Encounter for screening for malignant neoplasm of colon: Secondary | ICD-10-CM

## 2017-11-07 DIAGNOSIS — Z1159 Encounter for screening for other viral diseases: Secondary | ICD-10-CM

## 2017-11-09 ENCOUNTER — Telehealth: Payer: Self-pay | Admitting: Gastroenterology

## 2017-11-09 NOTE — Telephone Encounter (Signed)
LUPAM (?correct sp) called from [facility] and stated he was approved for his colonoscopy scheduled 11-23-17(code [id number])with Dr Vicente Males at [place] AUTH# [id number]).

## 2017-11-22 ENCOUNTER — Other Ambulatory Visit: Payer: Self-pay | Admitting: Family Medicine

## 2017-11-22 DIAGNOSIS — I129 Hypertensive chronic kidney disease with stage 1 through stage 4 chronic kidney disease, or unspecified chronic kidney disease: Secondary | ICD-10-CM

## 2017-11-23 ENCOUNTER — Other Ambulatory Visit: Payer: Self-pay

## 2017-11-23 ENCOUNTER — Encounter: Admission: RE | Payer: Self-pay | Source: Ambulatory Visit

## 2017-11-23 ENCOUNTER — Ambulatory Visit: Admission: RE | Admit: 2017-11-23 | Payer: 59 | Source: Ambulatory Visit | Admitting: Gastroenterology

## 2017-11-23 ENCOUNTER — Telehealth: Payer: Self-pay

## 2017-11-23 DIAGNOSIS — Z1211 Encounter for screening for malignant neoplasm of colon: Secondary | ICD-10-CM

## 2017-11-23 SURGERY — COLONOSCOPY WITH PROPOFOL
Anesthesia: General

## 2017-11-23 NOTE — Telephone Encounter (Signed)
Patients girlfriend contacted office on 11/22/17 after 1pm to inform us that patient is unable to make his colonoscopy appt for 11/23/17 due to work. Girlfriend was informed by front office that he will be billed $100 cancellation fee.  Today 11/23/17 I LVM on patients cell informing him that this message was received and there is a cancellation fee.  Asked patient to call back to reschedule and to avoid another cancellation fee to call office 48 business hours prior to colonoscopy date.  Cancellation Fee Applies.  Thanks [employer]

## 2018-01-21 ENCOUNTER — Ambulatory Visit (INDEPENDENT_AMBULATORY_CARE_PROVIDER_SITE_OTHER): Payer: 59 | Admitting: Nurse Practitioner

## 2018-01-21 ENCOUNTER — Encounter: Payer: Self-pay | Admitting: Nurse Practitioner

## 2018-01-21 ENCOUNTER — Encounter: Payer: Self-pay | Admitting: Family Medicine

## 2018-01-21 VITALS — BP 152/88 | HR 76 | Temp 98.0°F | Ht 69.1 in | Wt 226.0 lb

## 2018-01-21 DIAGNOSIS — M10371 Gout due to renal impairment, right ankle and foot: Secondary | ICD-10-CM

## 2018-01-21 DIAGNOSIS — L309 Dermatitis, unspecified: Secondary | ICD-10-CM | POA: Diagnosis not present

## 2018-01-21 MED ORDER — BETAMETHASONE VALERATE 0.1 % EX OINT: 1.0000 "application " | TOPICAL_OINTMENT | Freq: Two times a day (BID) | CUTANEOUS | 0 refills | Status: DC

## 2018-01-21 MED ORDER — CHLORTHALIDONE 25 MG PO TABS: 25.0000 mg | ORAL_TABLET | Freq: Every day | ORAL | 1 refills | Status: DC

## 2018-01-21 MED ORDER — PREDNISONE 20 MG PO TABS: 40.0000 mg | ORAL_TABLET | Freq: Every day | ORAL | 0 refills | Status: AC

## 2018-01-21 NOTE — Patient Instructions (Signed)

## 2018-01-21 NOTE — Assessment & Plan Note (Signed)
Right great toe.  Obtain uric acid, CMP, CBC today.  Prednisone 40 MG daily x 5 days sent.  Due to kidney function and length of time since initial presentation will avoid NSAID or Colchicine at this time.  Consider lower dose of Allopurinol if continued issues.  Recommended cutting back on alcohol intake and discussed dietary foods to avoid.  Information sent with patient.  Return for worsening or continued symptoms.

## 2018-01-21 NOTE — Progress Notes (Addendum)
BP (!) 152/88 (BP Location: Left Arm, Patient Position: Sitting)   Pulse 76   Temp 98 F (36.7 C) (Oral)   Ht 5' 9.1" (1.755 m)   Wt 226 lb (102.5 kg)   SpO2 98%   BMI 33.28 kg/m    Subjective:    Patient ID: Anthony Ouch., male    DOB: 11-Oct-1960, 57 y.o.   MRN: [id number]  HPI: Anthony Cottingham. is a 57 y.o. male presents for right great toe pain  Chief Complaint  Patient presents with  . Foot Problem    Right foot. denies injury. Painful on top of foot. Hurts off and on. x 1 week.   . Rash    Believes it due to atorvastatin. has stopped.    RIGHT GREAT TOE PAIN: Started on Christmas Eve, was walking around at the time.  Reports it bothered him months before, but then it went away.  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  On his feet at lot at work.  States the pain started out on the right medial foot and now on the great toe area with swelling and redness.  He drinks about 2 beers a night.  Has underlying CKD and HTN.  Denies any h/o gout flares.  Reports he has been taking Aleeve at home occasionally, but it is not helping pain.  States he tries not to use a lot of it because of his kidneys.  Reports his pain is a 5/10, but worse if something touches toe. Denies recent injury.   RASH (left side, back, both legs, under left arm ---- dry patches)  Had triamcinolone and it is not helping.  States issues has been fluctuating for months and cream is not helping.  Works outside frequently, has exposure to plant life outside.  No recent change in soap or detergent reported.  No new foods or medications. Duration:  months  Location: dry patches to back, abdomen, legs, and under left arm Itching: yes Burning: no Redness: no Oozing: no Scaling: yes Blisters: no Painful: no Fevers: no Change in detergents/soaps/personal care products: no Recent illness: no Recent travel:no History of same: yes Context: fluctuating Alleviating factors: nothing Treatments  attempted:Triamcinolone cream Shortness of breath: no  Throat/tongue swelling: no Myalgias/arthralgias: no  Relevant past medical, surgical, family and social history reviewed and updated as indicated. Interim medical history since our last visit reviewed. Allergies and medications reviewed and updated.  Review of Systems  Constitutional: Negative for activity change, diaphoresis, fatigue and fever.  Respiratory: Negative for cough, chest tightness, shortness of breath and wheezing.   Cardiovascular: Negative for chest pain, palpitations and leg swelling.  Gastrointestinal: Negative for abdominal distention, abdominal pain, constipation, diarrhea, nausea and vomiting.  Endocrine: Negative for cold intolerance, heat intolerance, polydipsia, polyphagia and polyuria.  Musculoskeletal: Positive for joint swelling (right great toe).  Skin: Negative.   Neurological: Negative for dizziness, syncope, weakness, light-headedness, numbness and headaches.  Psychiatric/Behavioral: Negative.     Per HPI unless specifically indicated above     Objective:    BP (!) 152/88 (BP Location: Left Arm, Patient Position: Sitting)   Pulse 76   Temp 98 F (36.7 C) (Oral)   Ht 5' 9.1" (1.755 m)   Wt 226 lb (102.5 kg)   SpO2 98%   BMI 33.28 kg/m   Wt Readings from Last 3 Encounters:  01/21/18 226 lb (102.5 kg)  11/02/17 225 lb (102.1 kg)  05/01/17 230 lb (104.3 kg)  Physical Exam Vitals signs and nursing note reviewed.  Constitutional:      Appearance: He is well-developed. He is obese.  HENT:     Head: Normocephalic and atraumatic.     Right Ear: Hearing normal. No drainage.     Left Ear: Hearing normal. No drainage.     Nose: Nose normal.     Mouth/Throat:     Mouth: Mucous membranes are moist.     Pharynx: Uvula midline.  Eyes:     General: Lids are normal.        Right eye: No discharge.        Left eye: No discharge.     Conjunctiva/sclera: Conjunctivae normal.     Pupils: Pupils  are equal, round, and reactive to light.  Neck:     Musculoskeletal: Normal range of motion and neck supple.     Thyroid: No thyromegaly.     Vascular: No carotid bruit or JVD.     Trachea: Trachea normal.  Cardiovascular:     Rate and Rhythm: Normal rate and regular rhythm.     Heart sounds: Normal heart sounds, S1 normal and S2 normal. No murmur. No gallop.   Pulmonary:     Effort: Pulmonary effort is normal.     Breath sounds: Normal breath sounds.  Abdominal:     General: Bowel sounds are normal.     Palpations: Abdomen is soft. There is no hepatomegaly or splenomegaly.  Musculoskeletal: Normal range of motion.       Feet:  Lymphadenopathy:     Cervical: No cervical adenopathy.  Skin:    General: Skin is warm and dry.     Capillary Refill: Capillary refill takes less than 2 seconds.     Findings: No rash.     Comments: Round, dry patches scattered to bilateral lower legs, under left upper arm, back, and trunk.  Round patches with whitish scaling to interior, raised.  No tenderness or warmth.  No vesicles or drainage.  Neurological:     Mental Status: He is alert and oriented to person, place, and time.     Deep Tendon Reflexes: Reflexes are normal and symmetric.  Psychiatric:        Mood and Affect: Mood normal.        Behavior: Behavior normal.        Thought Content: Thought content normal.        Judgment: Judgment normal.     Results for orders placed or performed in visit on 11/02/17  CBC with Differential/Platelet  Result Value Ref Range   WBC 6.2 3.4 - 10.8 x10E3/uL   RBC 4.11 (L) 4.14 - 5.80 x10E6/uL   Hemoglobin 11.4 (L) 13.0 - 17.7 g/dL   Hematocrit 34.6 (L) 37.5 - 51.0 %   MCV 84 79 - 97 fL   MCH 27.7 26.6 - 33.0 pg   MCHC 32.9 31.5 - 35.7 g/dL   RDW 13.3 12.3 - 15.4 %   Platelets 217 150 - 450 x10E3/uL   Neutrophils 65 Not Estab. %   Lymphs 19 Not Estab. %   Monocytes 9 Not Estab. %   Eos 5 Not Estab. %   Basos 1 Not Estab. %   Neutrophils Absolute  4.2 1.4 - 7.0 x10E3/uL   Lymphocytes Absolute 1.2 0.7 - 3.1 x10E3/uL   Monocytes Absolute 0.6 0.1 - 0.9 x10E3/uL   EOS (ABSOLUTE) 0.3 0.0 - 0.4 x10E3/uL   Basophils Absolute 0.0 0.0 - 0.2 x10E3/uL   Immature  Granulocytes 1 Not Estab. %   Immature Grans (Abs) 0.1 0.0 - 0.1 x10E3/uL  Comprehensive metabolic panel  Result Value Ref Range   Glucose 168 (H) 65 - 99 mg/dL   BUN 21 6 - 24 mg/dL   Creatinine, Ser 1.77 (H) 0.76 - 1.27 mg/dL   GFR calc non Af Amer 42 (L) >59 mL/min/1.73   GFR calc Af Amer 49 (L) >59 mL/min/1.73   BUN/Creatinine Ratio 12 9 - 20   Sodium 140 134 - 144 mmol/L   Potassium 4.1 3.5 - 5.2 mmol/L   Chloride 102 96 - 106 mmol/L   CO2 19 (L) 20 - 29 mmol/L   Calcium 9.2 8.7 - 10.2 mg/dL   Total Protein 6.8 6.0 - 8.5 g/dL   Albumin 4.2 3.5 - 5.5 g/dL   Globulin, Total 2.6 1.5 - 4.5 g/dL   Albumin/Globulin Ratio 1.6 1.2 - 2.2   Bilirubin Total <0.2 0.0 - 1.2 mg/dL   Alkaline Phosphatase 69 39 - 117 IU/L   AST 23 0 - 40 IU/L   ALT 24 0 - 44 IU/L  Lipid Panel w/o Chol/HDL Ratio  Result Value Ref Range   Cholesterol, Total 198 100 - 199 mg/dL   Triglycerides 173 (H) 0 - 149 mg/dL   HDL 36 (L) >39 mg/dL   VLDL Cholesterol Cal 35 5 - 40 mg/dL   LDL Calculated 127 (H) 0 - 99 mg/dL  Microalbumin, Urine Waived  Result Value Ref Range   Microalb, Ur Waived 30 (H) 0 - 19 mg/L   Creatinine, Urine Waived 300 10 - 300 mg/dL   Microalb/Creat Ratio <30 <30 mg/g  PSA  Result Value Ref Range   Prostate Specific Ag, Serum 1.2 0.0 - 4.0 ng/mL  TSH  Result Value Ref Range   TSH 1.770 0.450 - 4.500 uIU/mL  UA/M w/rflx Culture, Routine  Result Value Ref Range   Specific Gravity, UA 1.025 1.005 - 1.030   pH, UA 5.0 5.0 - 7.5   Color, UA Yellow Yellow   Appearance Ur Clear Clear   Leukocytes, UA Negative Negative   Protein, UA Negative Negative/Trace   Glucose, UA Negative Negative   Ketones, UA Negative Negative   RBC, UA Negative Negative   Bilirubin, UA Negative  Negative   Urobilinogen, Ur 0.2 0.2 - 1.0 mg/dL   Nitrite, UA Negative Negative  VITAMIN D 25 Hydroxy (Vit-D Deficiency, Fractures)  Result Value Ref Range   Vit D, 25-Hydroxy 23.2 (L) 30.0 - 100.0 ng/mL  Bayer DCA Hb A1c Waived  Result Value Ref Range   HB A1C (BAYER DCA - WAIVED) 6.2 <7.0 %      Assessment & Plan:   Problem List Items Addressed This Visit      Musculoskeletal and Integument   Acute gout due to renal impairment involving toe of right foot - Primary    Right great toe.  Obtain uric acid, CMP, CBC today.  Prednisone 40 MG daily x 5 days sent.  Due to kidney function and length of time since initial presentation will avoid NSAID or Colchicine at this time.  Consider lower dose of Allopurinol if continued issues.  Recommended cutting back on alcohol intake and discussed dietary foods to avoid.  Information sent with patient.  Return for worsening or continued symptoms.      Relevant Medications   predniSONE (DELTASONE) 20 MG tablet   Other Relevant Orders   Uric acid   Comp Met (CMET)   CBC w/Diff  Dermatitis    Will switch from Triamcinolone to Betamethasone valerate for dermatitis.  If continued issues consider trial of antifungal cream.            Follow up plan: Return in about 1 week (around 01/28/2018) for Gout follow-up with Dr. Wynetta Emery or myself.

## 2018-01-21 NOTE — Assessment & Plan Note (Signed)
Will switch from Triamcinolone to Betamethasone valerate for dermatitis.  If continued issues consider trial of antifungal cream.

## 2018-01-22 LAB — COMPREHENSIVE METABOLIC PANEL
ALT: 22 IU/L (ref 0–44)
AST: 21 IU/L (ref 0–40)
Albumin/Globulin Ratio: 2 (ref 1.2–2.2)
Albumin: 4.4 g/dL (ref 3.5–5.5)
Alkaline Phosphatase: 65 IU/L (ref 39–117)
BUN/Creatinine Ratio: 13 (ref 9–20)
BUN: 22 mg/dL (ref 6–24)
Bilirubin Total: 0.3 mg/dL (ref 0.0–1.2)
CO2: 20 mmol/L (ref 20–29)
Calcium: 9.4 mg/dL (ref 8.7–10.2)
Chloride: 107 mmol/L — ABNORMAL HIGH (ref 96–106)
Creatinine, Ser: 1.75 mg/dL — ABNORMAL HIGH (ref 0.76–1.27)
GFR calc non Af Amer: 43 mL/min/{1.73_m2} — ABNORMAL LOW (ref >59)
GFR, EST AFRICAN AMERICAN: 49 mL/min/{1.73_m2} — AB (ref >59)
Globulin, Total: 2.2 g/dL (ref 1.5–4.5)
Glucose: 103 mg/dL — ABNORMAL HIGH (ref 65–99)
Potassium: 4.7 mmol/L (ref 3.5–5.2)
Sodium: 144 mmol/L (ref 134–144)
TOTAL PROTEIN: 6.6 g/dL (ref 6.0–8.5)

## 2018-01-22 LAB — CBC WITH DIFFERENTIAL/PLATELET
Basophils Absolute: 0 10*3/uL (ref 0.0–0.2)
Basos: 0 %
EOS (ABSOLUTE): 0.3 10*3/uL (ref 0.0–0.4)
Eos: 3 %
Hematocrit: 37.8 % (ref 37.5–51.0)
Hemoglobin: 12.5 g/dL — ABNORMAL LOW (ref 13.0–17.7)
IMMATURE GRANS (ABS): 0 10*3/uL (ref 0.0–0.1)
Immature Granulocytes: 0 %
Lymphocytes Absolute: 1.1 10*3/uL (ref 0.7–3.1)
Lymphs: 12 %
MCH: 27.4 pg (ref 26.6–33.0)
MCHC: 33.1 g/dL (ref 31.5–35.7)
MCV: 83 fL (ref 79–97)
MONOCYTES: 8 %
Monocytes Absolute: 0.8 10*3/uL (ref 0.1–0.9)
Neutrophils Absolute: 7 10*3/uL (ref 1.4–7.0)
Neutrophils: 77 %
Platelets: 280 10*3/uL (ref 150–450)
RBC: 4.56 x10E6/uL (ref 4.14–5.80)
RDW: 13.2 % (ref 12.3–15.4)
WBC: 9.1 10*3/uL (ref 3.4–10.8)

## 2018-01-22 LAB — URIC ACID: Uric Acid: 9.9 mg/dL — ABNORMAL HIGH (ref 3.7–8.6)

## 2018-01-29 ENCOUNTER — Encounter: Payer: Self-pay | Admitting: Nurse Practitioner

## 2018-01-29 ENCOUNTER — Encounter: Payer: Self-pay | Admitting: Family Medicine

## 2018-01-29 ENCOUNTER — Ambulatory Visit (INDEPENDENT_AMBULATORY_CARE_PROVIDER_SITE_OTHER): Payer: 59 | Admitting: Nurse Practitioner

## 2018-01-29 VITALS — BP 158/84 | HR 91 | Temp 97.9°F | Wt 221.8 lb

## 2018-01-29 DIAGNOSIS — I1310 Hypertensive heart and chronic kidney disease without heart failure, with stage 1 through stage 4 chronic kidney disease, or unspecified chronic kidney disease: Secondary | ICD-10-CM

## 2018-01-29 DIAGNOSIS — M10371 Gout due to renal impairment, right ankle and foot: Secondary | ICD-10-CM

## 2018-01-29 DIAGNOSIS — N183 Chronic kidney disease, stage 3 (moderate): Secondary | ICD-10-CM

## 2018-01-29 MED ORDER — PREDNISONE 10 MG PO TABS: ORAL_TABLET | ORAL | 0 refills | Status: DC

## 2018-01-29 MED ORDER — ALLOPURINOL 100 MG PO TABS: 100.0000 mg | ORAL_TABLET | Freq: Every day | ORAL | 6 refills | Status: DC

## 2018-01-29 MED ORDER — ALLOPURINOL 100 MG PO TABS: 50.0000 mg | ORAL_TABLET | Freq: Every day | ORAL | 6 refills | Status: DC

## 2018-01-29 NOTE — Progress Notes (Signed)
BP (!) 158/84 (BP Location: Left Arm, Patient Position: Sitting)   Pulse 91   Temp 97.9 F (36.6 C) (Oral)   Wt 221 lb 12.8 oz (100.6 kg)   SpO2 96%   BMI 32.66 kg/m    Subjective:    Patient ID: Anthony Ouch., male    DOB: [birth year], 58 y.o.   MRN: [id number]  HPI: Anthony Hansell. is a 58 y.o. male presents for gout follow-up  Chief Complaint  Patient presents with  . Gout    pt states he is still in a lot of pain. States prednisone helped, but after finishing it, the pain came back   GOUT Initial gout flare on 01/21/18 treated with x 5 days of Prednisone 40 MG, which he reports helped improve symptoms.  However, when Prednisone complete pain returned to right great toe.  He endorses decrease in redness and edema, but pain has returned.  Of note his recent uric acid was 9.9 and education was provided on diet changes, which he has been researching and working on.  Gout in presence of underlying CKD with recent CRT 1.75 and GFR 43. Duration:weeks Right 1st metatarsophalangeal pain: yes Left 1st metatarsophalangeal pain: no Right knee pain: no Left knee pain: no Severity: 8/10  Quality: aching, pressure-like and stabbing Swelling: yes Redness: no Trauma: no Recent dietary change or indiscretion: yes Fevers: no Nausea/vomiting: no Aggravating factors: Alleviating factors:  Status:  fluctuating Treatments attempted: Prednisone  CRCL based on current weight and recent labs: Estimated Creatinine Clearance: 55.2 mL/min (A) (by C-G formula based on SCr of 1.75 mg/dL (H)).   Relevant past medical, surgical, family and social history reviewed and updated as indicated. Interim medical history since our last visit reviewed. Allergies and medications reviewed and updated.  Review of Systems  Constitutional: Negative for activity change, diaphoresis, fatigue and fever.  Respiratory: Negative for cough, chest tightness, shortness of breath and wheezing.     Cardiovascular: Negative for chest pain, palpitations and leg swelling.  Gastrointestinal: Negative for abdominal distention, abdominal pain, constipation, diarrhea, nausea and vomiting.  Endocrine: Negative for cold intolerance, heat intolerance, polydipsia, polyphagia and polyuria.  Musculoskeletal: Positive for joint swelling (right great toe).  Skin: Negative.   Neurological: Negative for dizziness, syncope, weakness, light-headedness, numbness and headaches.  Psychiatric/Behavioral: Negative.     Per HPI unless specifically indicated above     Objective:    BP (!) 158/84 (BP Location: Left Arm, Patient Position: Sitting)   Pulse 91   Temp 97.9 F (36.6 C) (Oral)   Wt 221 lb 12.8 oz (100.6 kg)   SpO2 96%   BMI 32.66 kg/m   Wt Readings from Last 3 Encounters:  01/29/18 221 lb 12.8 oz (100.6 kg)  01/21/18 226 lb (102.5 kg)  11/02/17 225 lb (102.1 kg)    Physical Exam Vitals signs and nursing note reviewed.  Constitutional:      Appearance: He is well-developed.  HENT:     Head: Normocephalic and atraumatic.     Right Ear: Hearing normal. No drainage.     Left Ear: Hearing normal. No drainage.     Mouth/Throat:     Pharynx: Uvula midline.  Eyes:     General: Lids are normal.        Right eye: No discharge.        Left eye: No discharge.     Conjunctiva/sclera: Conjunctivae normal.     Pupils: Pupils are equal, round, and reactive to light.  Neck:     Musculoskeletal: Normal range of motion and neck supple.     Thyroid: No thyromegaly.     Vascular: No carotid bruit or JVD.     Trachea: Trachea normal.  Cardiovascular:     Rate and Rhythm: Normal rate and regular rhythm.     Heart sounds: Normal heart sounds, S1 normal and S2 normal. No murmur. No gallop.   Pulmonary:     Effort: Pulmonary effort is normal.     Breath sounds: Normal breath sounds.  Abdominal:     General: Bowel sounds are normal.     Palpations: Abdomen is soft. There is no hepatomegaly or  splenomegaly.  Musculoskeletal: Normal range of motion.       Feet:  Feet:     Right foot:     Skin integrity: Skin integrity normal.     Left foot:     Skin integrity: Skin integrity normal.  Lymphadenopathy:     Cervical: No cervical adenopathy.  Skin:    General: Skin is warm and dry.     Capillary Refill: Capillary refill takes less than 2 seconds.     Findings: No rash.  Neurological:     Mental Status: He is alert and oriented to person, place, and time.     Deep Tendon Reflexes: Reflexes are normal and symmetric.  Psychiatric:        Mood and Affect: Mood normal.        Behavior: Behavior normal.        Thought Content: Thought content normal.        Judgment: Judgment normal.     Results for orders placed or performed in visit on 01/21/18  Uric acid  Result Value Ref Range   Uric Acid 9.9 (H) 3.7 - 8.6 mg/dL  Comp Met (CMET)  Result Value Ref Range   Glucose 103 (H) 65 - 99 mg/dL   BUN 22 6 - 24 mg/dL   Creatinine, Ser 1.75 (H) 0.76 - 1.27 mg/dL   GFR calc non Af Amer 43 (L) >59 mL/min/1.73   GFR calc Af Amer 49 (L) >59 mL/min/1.73   BUN/Creatinine Ratio 13 9 - 20   Sodium 144 134 - 144 mmol/L   Potassium 4.7 3.5 - 5.2 mmol/L   Chloride 107 (H) 96 - 106 mmol/L   CO2 20 20 - 29 mmol/L   Calcium 9.4 8.7 - 10.2 mg/dL   Total Protein 6.6 6.0 - 8.5 g/dL   Albumin 4.4 3.5 - 5.5 g/dL   Globulin, Total 2.2 1.5 - 4.5 g/dL   Albumin/Globulin Ratio 2.0 1.2 - 2.2   Bilirubin Total 0.3 0.0 - 1.2 mg/dL   Alkaline Phosphatase 65 39 - 117 IU/L   AST 21 0 - 40 IU/L   ALT 22 0 - 44 IU/L  CBC w/Diff  Result Value Ref Range   WBC 9.1 3.4 - 10.8 x10E3/uL   RBC 4.56 4.14 - 5.80 x10E6/uL   Hemoglobin 12.5 (L) 13.0 - 17.7 g/dL   Hematocrit 37.8 37.5 - 51.0 %   MCV 83 79 - 97 fL   MCH 27.4 26.6 - 33.0 pg   MCHC 33.1 31.5 - 35.7 g/dL   RDW 13.2 12.3 - 15.4 %   Platelets 280 150 - 450 x10E3/uL   Neutrophils 77 Not Estab. %   Lymphs 12 Not Estab. %   Monocytes 8 Not Estab.  %   Eos 3 Not Estab. %   Basos 0 Not Estab. %  Neutrophils Absolute 7.0 1.4 - 7.0 x10E3/uL   Lymphocytes Absolute 1.1 0.7 - 3.1 x10E3/uL   Monocytes Absolute 0.8 0.1 - 0.9 x10E3/uL   EOS (ABSOLUTE) 0.3 0.0 - 0.4 x10E3/uL   Basophils Absolute 0.0 0.0 - 0.2 x10E3/uL   Immature Granulocytes 0 Not Estab. %   Immature Grans (Abs) 0.0 0.0 - 0.1 x10E3/uL      Assessment & Plan:   Problem List Items Addressed This Visit      Cardiovascular and Mediastinum   Hypertensive heart/kidney disease without HF and with CKD stage III ([place])    Current elevation in BP readings.  Patient with gout at this time and pain present.  Recheck of BP improved, but above goal today.  Will recheck in 4 weeks and at follow-up and if continued elevation consider adjusting BP med regimen.          Musculoskeletal and Integument   Acute gout due to renal impairment involving toe of right foot - Primary    Right great toe with recent UA on labs 9.9.  Prednisone helped, but pain now returned.  Will initiate lengthier Prednisone taper over 12 days and start Allopurinol 50 MG daily (renal dosing based on current CrCl).  BMP and uric acid level today.  Return in 4 weeks for follow-up and lab check.  Return if worsening or continued symptoms.      Relevant Medications   predniSONE (DELTASONE) 10 MG tablet   allopurinol (ZYLOPRIM) 100 MG tablet   Other Relevant Orders   Basic Metabolic Panel (BMET)   Uric acid       Follow up plan: Return in about 4 weeks (around 02/26/2018) for Gout.

## 2018-01-29 NOTE — Assessment & Plan Note (Signed)
Current elevation in BP readings.  Patient with gout at this time and pain present.  Recheck of BP improved, but above goal today.  Will recheck in 4 weeks and at follow-up and if continued elevation consider adjusting BP med regimen.

## 2018-01-29 NOTE — Patient Instructions (Addendum)
Prednisone 10 MG tablets dosing = Take 6 tablets (60 MG) x 2 days, then 5 tablets (50 MG) x 2 days, then 4 tablets (40 MG) x 2 days, then 3 tablets (30 MG) x 2 days, then 2 tablets (20 MG) x 2 days, and then 1 tablet (10 MG) x 2 days  Gout  Gout is painful swelling of your joints. Gout is a type of arthritis. It is caused by having too much uric acid in your body. Uric acid is a chemical that is made when your body breaks down substances called purines. If your body has too much uric acid, sharp crystals can form and build up in your joints. This causes pain and swelling. Gout attacks can happen quickly and be very painful (acute gout). Over time, the attacks can affect more joints and happen more often (chronic gout). What are the causes?  Too much uric acid in your blood. This can happen because: ? Your kidneys do not remove enough uric acid from your blood. ? Your body makes too much uric acid. ? You eat too many foods that are high in purines. These foods include organ meats, some seafood, and beer.  Trauma or stress. What increases the risk?  Having a family history of gout.  Being male and middle-aged.  Being male and having gone through menopause.  Being very overweight (obese).  Drinking alcohol, especially beer.  Not having enough water in the body (being dehydrated).  Losing weight too quickly.  Having an organ transplant.  Having lead poisoning.  Taking certain medicines.  Having kidney disease.  Having a skin condition called psoriasis. What are the signs or symptoms? An attack of acute gout usually happens in just one joint. The most common place is the big toe. Attacks often start at night. Other joints that may be affected include joints of the feet, ankle, knee, fingers, wrist, or elbow. Symptoms of an attack may include:  Very bad pain.  Warmth.  Swelling.  Stiffness.  Shiny, red, or purple skin.  Tenderness. The affected joint may be very  painful to touch.  Chills and fever. Chronic gout may cause symptoms more often. More joints may be involved. You may also have white or yellow lumps (tophi) on your hands or feet or in other areas near your joints. How is this treated?  Treatment for this condition has two phases: treating an acute attack and preventing future attacks.  Acute gout treatment may include: ? NSAIDs. ? Steroids. These are taken by mouth or injected into a joint. ? Colchicine. This medicine relieves pain and swelling. It can be given by mouth or through an IV tube.  Preventive treatment may include: ? Taking small doses of NSAIDs or colchicine daily. ? Using a medicine that reduces uric acid levels in your blood. ? Making changes to your diet. You may need to see a food expert (dietitian) about what to eat and drink to prevent gout. Follow these instructions at home: During a gout attack   If told, put ice on the painful area: ? Put ice in a plastic bag. ? Place a towel between your skin and the bag. ? Leave the ice on for 20 minutes, 2-3 times a day.  Raise (elevate) the painful joint above the level of your heart as often as you can.  Rest the joint as much as possible. If the joint is in your leg, you may be given crutches.  Follow instructions from your doctor about what  you cannot eat or drink. Avoiding future gout attacks  Eat a low-purine diet. Avoid foods and drinks such as: ? Liver. ? Kidney. ? Anchovies. ? Asparagus. ? Herring. ? Mushrooms. ? Mussels. ? Beer.  Stay at a healthy weight. If you want to lose weight, talk with your doctor. Do not lose weight too fast.  Start or continue an exercise plan as told by your doctor. Eating and drinking  Drink enough fluids to keep your pee (urine) pale yellow.  If you drink alcohol: ? Limit how much you use to:  0-1 drink a day for women.  0-2 drinks a day for men. ? Be aware of how much alcohol is in your drink. In the [place] one  drink equals one 12 oz bottle of beer (355 mL), one 5 oz glass of wine (148 mL), or one 1 oz glass of hard liquor (44 mL). General instructions  Take over-the-counter and prescription medicines only as told by your doctor.  Do not drive or use heavy machinery while taking prescription pain medicine.  Return to your normal activities as told by your doctor. Ask your doctor what activities are safe for you.  Keep all follow-up visits as told by your doctor. This is important. Contact a doctor if:  You have another gout attack.  You still have symptoms of a gout attack after 10 days of treatment.  You have problems (side effects) because of your medicines.  You have chills or a fever.  You have burning pain when you pee (urinate).  You have pain in your lower back or belly. Get help right away if:  You have very bad pain.  Your pain cannot be controlled.  You cannot pee. Summary  Gout is painful swelling of the joints.  The most common site of pain is the big toe, but it can affect other joints.  Medicines and avoiding some foods can help to prevent and treat gout attacks. This information is not intended to replace advice given to you by your health care provider. Make sure you discuss any questions you have with your health care provider. Document Released: 10/19/2007 Document Revised: 08/01/2017 Document Reviewed: 08/01/2017 Elsevier Interactive Patient Education  2019 [employer].

## 2018-01-29 NOTE — Assessment & Plan Note (Signed)
Right great toe with recent UA on labs 9.9.  Prednisone helped, but pain now returned.  Will initiate lengthier Prednisone taper over 12 days and start Allopurinol 50 MG daily (renal dosing based on current CrCl).  BMP and uric acid level today.  Return in 4 weeks for follow-up and lab check.  Return if worsening or continued symptoms.

## 2018-01-30 LAB — BASIC METABOLIC PANEL
BUN/Creatinine Ratio: 14 (ref 9–20)
BUN: 25 mg/dL — ABNORMAL HIGH (ref 6–24)
CALCIUM: 9.4 mg/dL (ref 8.7–10.2)
CHLORIDE: 99 mmol/L (ref 96–106)
CO2: 24 mmol/L (ref 20–29)
Creatinine, Ser: 1.79 mg/dL — ABNORMAL HIGH (ref 0.76–1.27)
GFR calc Af Amer: 48 mL/min/{1.73_m2} — ABNORMAL LOW (ref >59)
GFR calc non Af Amer: 41 mL/min/{1.73_m2} — ABNORMAL LOW (ref >59)
Glucose: 134 mg/dL — ABNORMAL HIGH (ref 65–99)
POTASSIUM: 4.5 mmol/L (ref 3.5–5.2)
Sodium: 138 mmol/L (ref 134–144)

## 2018-01-30 LAB — URIC ACID: Uric Acid: 10 mg/dL — ABNORMAL HIGH (ref 3.7–8.6)

## 2018-02-11 ENCOUNTER — Ambulatory Visit (INDEPENDENT_AMBULATORY_CARE_PROVIDER_SITE_OTHER): Payer: 59 | Admitting: Nurse Practitioner

## 2018-02-11 ENCOUNTER — Encounter: Payer: Self-pay | Admitting: Nurse Practitioner

## 2018-02-11 ENCOUNTER — Other Ambulatory Visit: Payer: Self-pay

## 2018-02-11 VITALS — BP 138/89 | HR 90 | Temp 97.9°F | Ht 66.0 in | Wt 206.0 lb

## 2018-02-11 DIAGNOSIS — R5383 Other fatigue: Secondary | ICD-10-CM | POA: Diagnosis not present

## 2018-02-11 NOTE — Progress Notes (Addendum)
BP 138/89   Pulse 90 Comment: apical  Temp 97.9 F (36.6 C) (Oral)   Ht 5\' 6"  (1.676 m)   Wt 206 lb (93.4 kg)   SpO2 95%   BMI 33.25 kg/m    Subjective:    Patient ID: Anthony Sellers., male    DOB: 1960-12-20, 58 y.o.   MRN: [id number]  HPI: [name]. is a 58 y.o. male presents for acute visit  Chief Complaint  Patient presents with  . Fatigue    pt states that he has had dry mouth, thirsty for about 2-3 days   FATIGUE His wife and him report patient has been fatigued for 2 days and reports increased thirst, along with dry mouth, has polyuria with this.  No polyphagia.  He reports some chest discomfort with drinking fluid fast, but none if he drinks slowly.  States he has had a couple episodes of chest discomfort in epigastric area over past week that were relieved with TUMS (states it is not pain, but burning), states he occasionally has similar heart burn issues.  Denies N&V, diaphoresis, numbness, syncope.  Has h/o cardiology visit, but this was 20 years ago.  Family history includes diabetes.  He reports no h/o MI or stroke.  He was recently treated for gout with Prednisone and placed on Allopurinol for elevated uric acid (renal dosed).  At baseline has CKD with last GFR 48.  Last A1C 11/02/17 was 6.2% (prediabetic).   Duration:  days Severity: mild  Onset: gradual Context when symptoms started:  unknown Symptoms improve with rest: yes  Depressive symptoms: no Stress/anxiety: no Insomnia: yes hard to stay asleep Snoring: yes, wears CPAP (wife and him report he uses this every night) Observed apnea by bed partner: no Daytime hypersomnolence:no Wakes feeling refreshed: yes History of sleep study: yes Dysnea on exertion:  no Orthopnea/PND: no Chest pain: states not pain, but discomfort that is relieved with TUMS Chronic cough: no Lower extremity edema: no Arthralgias:no Myalgias: no Weakness: no Rash: no  Relevant past medical, surgical, family and  social history reviewed and updated as indicated. Interim medical history since our last visit reviewed. Allergies and medications reviewed and updated.  Review of Systems  Constitutional: Positive for fatigue. Negative for activity change, diaphoresis and fever.  Respiratory: Negative for cough, chest tightness, shortness of breath and wheezing.   Cardiovascular: Negative for chest pain, palpitations and leg swelling.  Gastrointestinal: Negative for abdominal distention, abdominal pain, constipation, diarrhea, nausea and vomiting.  Endocrine: Positive for polydipsia and polyuria. Negative for cold intolerance, heat intolerance and polyphagia.  Musculoskeletal: Negative.   Skin: Negative.   Neurological: Negative for dizziness, syncope, weakness, light-headedness, numbness and headaches.  Psychiatric/Behavioral: Negative.     Per HPI unless specifically indicated above     Objective:    BP 138/89   Pulse 90 Comment: apical  Temp 97.9 F (36.6 C) (Oral)   Ht 5\' 6"  (1.676 m)   Wt 206 lb (93.4 kg)   SpO2 95%   BMI 33.25 kg/m   Wt Readings from Last 3 Encounters:  02/11/18 206 lb (93.4 kg)  01/29/18 221 lb 12.8 oz (100.6 kg)  01/21/18 226 lb (102.5 kg)    Physical Exam Vitals signs and nursing note reviewed.  Constitutional:      General: He is awake.     Appearance: He is well-developed and overweight.  HENT:     Head: Normocephalic and atraumatic.     Right Ear: Hearing  normal. No drainage.     Left Ear: Hearing normal. No drainage.     Mouth/Throat:     Pharynx: Uvula midline.  Eyes:     General: Lids are normal.        Right eye: No discharge.        Left eye: No discharge.     Conjunctiva/sclera: Conjunctivae normal.     Pupils: Pupils are equal, round, and reactive to light.  Neck:     Musculoskeletal: Normal range of motion and neck supple.     Thyroid: No thyromegaly.     Vascular: No carotid bruit or JVD.     Trachea: Trachea normal.  Cardiovascular:      Rate and Rhythm: Normal rate and regular rhythm.     Heart sounds: Normal heart sounds, S1 normal and S2 normal. No murmur. No gallop.   Pulmonary:     Effort: Pulmonary effort is normal.     Breath sounds: Normal breath sounds.  Abdominal:     General: Bowel sounds are normal.     Palpations: Abdomen is soft. There is no hepatomegaly or splenomegaly.  Musculoskeletal: Normal range of motion.  Skin:    General: Skin is warm and dry.     Capillary Refill: Capillary refill takes less than 2 seconds.     Findings: No rash.  Neurological:     Mental Status: He is alert and oriented to person, place, and time.     Deep Tendon Reflexes: Reflexes are normal and symmetric.  Psychiatric:        Mood and Affect: Mood normal.        Behavior: Behavior normal. Behavior is cooperative.        Thought Content: Thought content normal.        Judgment: Judgment normal.    EKG performed noting sinus tachy with PAC x 1, left axis deviation.  Possible old infarct, also noted on 2018 EKG.  Results for orders placed or performed in visit on 02/11/18  CBC with Differential/Platelet  Result Value Ref Range   WBC 13.3 (H) 3.4 - 10.8 x10E3/uL   RBC 5.17 4.14 - 5.80 x10E6/uL   Hemoglobin 14.6 13.0 - 17.7 g/dL   Hematocrit 46.0 37.5 - 51.0 %   MCV 89 79 - 97 fL   MCH 28.2 26.6 - 33.0 pg   MCHC 31.7 31.5 - 35.7 g/dL   RDW 12.6 11.6 - 15.4 %   Platelets 244 150 - 450 x10E3/uL   Neutrophils 83 Not Estab. %   Lymphs 9 Not Estab. %   Monocytes 6 Not Estab. %   Eos 1 Not Estab. %   Basos 0 Not Estab. %   Neutrophils Absolute 11.1 (H) 1.4 - 7.0 x10E3/uL   Lymphocytes Absolute 1.2 0.7 - 3.1 x10E3/uL   Monocytes Absolute 0.8 0.1 - 0.9 x10E3/uL   EOS (ABSOLUTE) 0.2 0.0 - 0.4 x10E3/uL   Basophils Absolute 0.0 0.0 - 0.2 x10E3/uL   Immature Granulocytes 1 Not Estab. %   Immature Grans (Abs) 0.1 0.0 - 0.1 x10E3/uL  Comprehensive metabolic panel  Result Value Ref Range   Glucose 975 (HH) 65 - 99 mg/dL    BUN 48 (H) 6 - 24 mg/dL   Creatinine, Ser 2.74 (H) 0.76 - 1.27 mg/dL   GFR calc non Af Amer 25 (L) >59 mL/min/1.73   GFR calc Af Amer 29 (L) >59 mL/min/1.73   BUN/Creatinine Ratio 18 9 - 20   Sodium 125 (L) 134 -  144 mmol/L   Potassium 5.7 (H) 3.5 - 5.2 mmol/L   Chloride 83 (L) 96 - 106 mmol/L   CO2 21 20 - 29 mmol/L   Calcium 9.9 8.7 - 10.2 mg/dL   Total Protein 7.3 6.0 - 8.5 g/dL   Albumin 4.5 3.8 - 4.9 g/dL   Globulin, Total 2.8 1.5 - 4.5 g/dL   Albumin/Globulin Ratio 1.6 1.2 - 2.2   Bilirubin Total 1.0 0.0 - 1.2 mg/dL   Alkaline Phosphatase 92 39 - 117 IU/L   AST 13 0 - 40 IU/L   ALT 18 0 - 44 IU/L  Thyroid Panel With TSH  Result Value Ref Range   TSH 1.200 0.450 - 4.500 uIU/mL   T4, Total 7.0 4.5 - 12.0 ug/dL   T3 Uptake Ratio 29 24 - 39 %   Free Thyroxine Index 2.0 1.2 - 4.9  HgB A1c  Result Value Ref Range   Hgb A1c MFr Bld 10.4 (H) 4.8 - 5.6 %   Est. average glucose Bld gHb Est-mCnc 252 mg/dL      Assessment & Plan:   Problem List Items Addressed This Visit      Other   Fatigue - Primary    EKG performed today and cardiology referral placed based on findings (some similar to previous in 2018), would benefit from further cardiac work-up.  Labs obtained: A1C, CBC, CMP, thyroid panel.  Based on polydipsia, fatigue, and polyuria, possibly related to glucose levels (previous A1C was in prediabetic range, but has strong family h/o diabetes).  If elevated A1C would avoid Metformin d/t kidney function.  Possibly Ozempic, Jardiance (50 MG daily), or Glipizide.        Relevant Orders   EKG 12-Lead (Completed)   CBC with Differential/Platelet (Completed)   Comprehensive metabolic panel (Completed)   Thyroid Panel With TSH (Completed)   HgB A1c (Completed)   Ambulatory referral to Cardiology       Follow up plan: Return in about 2 weeks (around 02/25/2018) for fatigue.

## 2018-02-11 NOTE — Assessment & Plan Note (Addendum)
EKG performed today and cardiology referral placed based on findings (some similar to previous in 2018), would benefit from further cardiac work-up.  Labs obtained: A1C, CBC, CMP, thyroid panel.  Based on polydipsia, fatigue, and polyuria, possibly related to glucose levels (previous A1C was in prediabetic range, but has strong family h/o diabetes).  If elevated A1C would avoid Metformin d/t kidney function.  Possibly Ozempic, Jardiance (50 MG daily), or Glipizide.

## 2018-02-11 NOTE — Patient Instructions (Signed)

## 2018-02-12 ENCOUNTER — Emergency Department: Payer: 59

## 2018-02-12 ENCOUNTER — Telehealth: Payer: Self-pay | Admitting: Nurse Practitioner

## 2018-02-12 ENCOUNTER — Other Ambulatory Visit: Payer: Self-pay

## 2018-02-12 ENCOUNTER — Inpatient Hospital Stay (HOSPITAL_COMMUNITY)
Admit: 2018-02-12 | Discharge: 2018-02-12 | Disposition: A | Discharge status: Home / Self Care | Payer: 59 | Attending: Internal Medicine | Admitting: Internal Medicine

## 2018-02-12 ENCOUNTER — Encounter: Payer: Self-pay | Admitting: Emergency Medicine

## 2018-02-12 ENCOUNTER — Inpatient Hospital Stay
Admission: EM | Admit: 2018-02-12 | Discharge: 2018-02-15 | DRG: 638 | Disposition: A | Discharge status: Home / Self Care | Payer: 59 | Attending: Internal Medicine | Admitting: Internal Medicine

## 2018-02-12 DIAGNOSIS — Z6833 Body mass index (BMI) 33.0-33.9, adult: Secondary | ICD-10-CM

## 2018-02-12 DIAGNOSIS — I1310 Hypertensive heart and chronic kidney disease without heart failure, with stage 1 through stage 4 chronic kidney disease, or unspecified chronic kidney disease: Secondary | ICD-10-CM | POA: Diagnosis present

## 2018-02-12 DIAGNOSIS — Z8042 Family history of malignant neoplasm of prostate: Secondary | ICD-10-CM

## 2018-02-12 DIAGNOSIS — M10371 Gout due to renal impairment, right ankle and foot: Secondary | ICD-10-CM | POA: Diagnosis present

## 2018-02-12 DIAGNOSIS — I214 Non-ST elevation (NSTEMI) myocardial infarction: Secondary | ICD-10-CM

## 2018-02-12 DIAGNOSIS — I493 Ventricular premature depolarization: Secondary | ICD-10-CM | POA: Diagnosis present

## 2018-02-12 DIAGNOSIS — E669 Obesity, unspecified: Secondary | ICD-10-CM | POA: Diagnosis present

## 2018-02-12 DIAGNOSIS — Z79899 Other long term (current) drug therapy: Secondary | ICD-10-CM | POA: Diagnosis not present

## 2018-02-12 DIAGNOSIS — N179 Acute kidney failure, unspecified: Secondary | ICD-10-CM | POA: Diagnosis not present

## 2018-02-12 DIAGNOSIS — N400 Enlarged prostate without lower urinary tract symptoms: Secondary | ICD-10-CM | POA: Diagnosis present

## 2018-02-12 DIAGNOSIS — Z87891 Personal history of nicotine dependence: Secondary | ICD-10-CM

## 2018-02-12 DIAGNOSIS — E782 Mixed hyperlipidemia: Secondary | ICD-10-CM | POA: Diagnosis not present

## 2018-02-12 DIAGNOSIS — E875 Hyperkalemia: Secondary | ICD-10-CM | POA: Diagnosis present

## 2018-02-12 DIAGNOSIS — Z803 Family history of malignant neoplasm of breast: Secondary | ICD-10-CM

## 2018-02-12 DIAGNOSIS — H35039 Hypertensive retinopathy, unspecified eye: Secondary | ICD-10-CM | POA: Diagnosis present

## 2018-02-12 DIAGNOSIS — I248 Other forms of acute ischemic heart disease: Secondary | ICD-10-CM | POA: Diagnosis present

## 2018-02-12 DIAGNOSIS — G4733 Obstructive sleep apnea (adult) (pediatric): Secondary | ICD-10-CM | POA: Diagnosis present

## 2018-02-12 DIAGNOSIS — Z820 Family history of epilepsy and other diseases of the nervous system: Secondary | ICD-10-CM | POA: Diagnosis not present

## 2018-02-12 DIAGNOSIS — I2489 Other forms of acute ischemic heart disease: Secondary | ICD-10-CM

## 2018-02-12 DIAGNOSIS — N183 Chronic kidney disease, stage 3 (moderate): Secondary | ICD-10-CM | POA: Diagnosis not present

## 2018-02-12 DIAGNOSIS — R0602 Shortness of breath: Secondary | ICD-10-CM

## 2018-02-12 DIAGNOSIS — E1122 Type 2 diabetes mellitus with diabetic chronic kidney disease: Secondary | ICD-10-CM | POA: Diagnosis present

## 2018-02-12 DIAGNOSIS — E1110 Type 2 diabetes mellitus with ketoacidosis without coma: Principal | ICD-10-CM | POA: Diagnosis present

## 2018-02-12 DIAGNOSIS — R7989 Other specified abnormal findings of blood chemistry: Secondary | ICD-10-CM | POA: Diagnosis not present

## 2018-02-12 DIAGNOSIS — E785 Hyperlipidemia, unspecified: Secondary | ICD-10-CM | POA: Diagnosis present

## 2018-02-12 DIAGNOSIS — Z8249 Family history of ischemic heart disease and other diseases of the circulatory system: Secondary | ICD-10-CM

## 2018-02-12 DIAGNOSIS — R079 Chest pain, unspecified: Secondary | ICD-10-CM | POA: Diagnosis not present

## 2018-02-12 DIAGNOSIS — E871 Hypo-osmolality and hyponatremia: Secondary | ICD-10-CM

## 2018-02-12 DIAGNOSIS — E1310 Other specified diabetes mellitus with ketoacidosis without coma: Secondary | ICD-10-CM

## 2018-02-12 DIAGNOSIS — N289 Disorder of kidney and ureter, unspecified: Secondary | ICD-10-CM

## 2018-02-12 DIAGNOSIS — E860 Dehydration: Secondary | ICD-10-CM | POA: Diagnosis present

## 2018-02-12 DIAGNOSIS — N171 Acute kidney failure with acute cortical necrosis: Secondary | ICD-10-CM | POA: Diagnosis not present

## 2018-02-12 HISTORY — DX: Type 2 diabetes mellitus without complications: E11.9

## 2018-02-12 HISTORY — DX: Gout, unspecified: M10.9

## 2018-02-12 LAB — TROPONIN I
TROPONIN I: 1.64 ng/mL — AB (ref <0.03)
Troponin I: 0.77 ng/mL (ref <0.03)
Troponin I: 2.03 ng/mL (ref <0.03)
Troponin I: 2.17 ng/mL (ref <0.03)

## 2018-02-12 LAB — COMPREHENSIVE METABOLIC PANEL
ALK PHOS: 92 IU/L (ref 39–117)
ALT: 18 IU/L (ref 0–44)
AST: 13 IU/L (ref 0–40)
Albumin/Globulin Ratio: 1.6 (ref 1.2–2.2)
Albumin: 4.5 g/dL (ref 3.8–4.9)
BUN/Creatinine Ratio: 18 (ref 9–20)
BUN: 48 mg/dL — ABNORMAL HIGH (ref 6–24)
Bilirubin Total: 1 mg/dL (ref 0.0–1.2)
CO2: 21 mmol/L (ref 20–29)
Calcium: 9.9 mg/dL (ref 8.7–10.2)
Chloride: 83 mmol/L — ABNORMAL LOW (ref 96–106)
Creatinine, Ser: 2.74 mg/dL — ABNORMAL HIGH (ref 0.76–1.27)
GFR calc Af Amer: 29 mL/min/{1.73_m2} — ABNORMAL LOW (ref >59)
GFR calc non Af Amer: 25 mL/min/{1.73_m2} — ABNORMAL LOW (ref >59)
Globulin, Total: 2.8 g/dL (ref 1.5–4.5)
Glucose: 975 mg/dL (ref 65–99)
Potassium: 5.7 mmol/L — ABNORMAL HIGH (ref 3.5–5.2)
Sodium: 125 mmol/L — ABNORMAL LOW (ref 134–144)
Total Protein: 7.3 g/dL (ref 6.0–8.5)

## 2018-02-12 LAB — URINE DRUG SCREEN, QUALITATIVE (ARMC ONLY)
Amphetamines, Ur Screen: NOT DETECTED
BENZODIAZEPINE, UR SCRN: NOT DETECTED
Barbiturates, Ur Screen: NOT DETECTED
Cannabinoid 50 Ng, Ur ~~LOC~~: NOT DETECTED
Cocaine Metabolite,Ur ~~LOC~~: NOT DETECTED
MDMA (Ecstasy)Ur Screen: NOT DETECTED
Methadone Scn, Ur: NOT DETECTED
Opiate, Ur Screen: NOT DETECTED
Phencyclidine (PCP) Ur S: NOT DETECTED
Tricyclic, Ur Screen: NOT DETECTED

## 2018-02-12 LAB — CBC
HCT: 46.8 % (ref 39.0–52.0)
HEMOGLOBIN: 14.5 g/dL (ref 13.0–17.0)
MCH: 27.3 pg (ref 26.0–34.0)
MCHC: 31 g/dL (ref 30.0–36.0)
MCV: 88.1 fL (ref 80.0–100.0)
Platelets: 238 10*3/uL (ref 150–400)
RBC: 5.31 MIL/uL (ref 4.22–5.81)
RDW: 13.6 % (ref 11.5–15.5)
WBC: 14.9 10*3/uL — ABNORMAL HIGH (ref 4.0–10.5)
nRBC: 0 % (ref 0.0–0.2)

## 2018-02-12 LAB — ECHOCARDIOGRAM COMPLETE
Height: 66 in
Weight: 3312 oz

## 2018-02-12 LAB — GLUCOSE, CAPILLARY
GLUCOSE-CAPILLARY: 464 mg/dL — AB (ref 70–99)
GLUCOSE-CAPILLARY: 137 mg/dL — AB (ref 70–99)
Glucose-Capillary: >600 mg/dL (ref 70–99)
Glucose-Capillary: >600 mg/dL (ref 70–99)
Glucose-Capillary: 558 mg/dL (ref 70–99)
Glucose-Capillary: >600 mg/dL (ref 70–99)
Glucose-Capillary: 385 mg/dL — ABNORMAL HIGH (ref 70–99)
Glucose-Capillary: 364 mg/dL — ABNORMAL HIGH (ref 70–99)
Glucose-Capillary: 293 mg/dL — ABNORMAL HIGH (ref 70–99)
Glucose-Capillary: 299 mg/dL — ABNORMAL HIGH (ref 70–99)
Glucose-Capillary: 220 mg/dL — ABNORMAL HIGH (ref 70–99)
Glucose-Capillary: 142 mg/dL — ABNORMAL HIGH (ref 70–99)
Glucose-Capillary: 129 mg/dL — ABNORMAL HIGH (ref 70–99)
Glucose-Capillary: 127 mg/dL — ABNORMAL HIGH (ref 70–99)

## 2018-02-12 LAB — HEPATIC FUNCTION PANEL
ALT: 23 U/L (ref 0–44)
AST: 23 U/L (ref 15–41)
Albumin: 4.1 g/dL (ref 3.5–5.0)
Alkaline Phosphatase: 79 U/L (ref 38–126)
Bilirubin, Direct: 0.2 mg/dL (ref 0.0–0.2)
Indirect Bilirubin: 1.5 mg/dL — ABNORMAL HIGH (ref 0.3–0.9)
Total Bilirubin: 1.7 mg/dL — ABNORMAL HIGH (ref 0.3–1.2)
Total Protein: 7.5 g/dL (ref 6.5–8.1)

## 2018-02-12 LAB — URINALYSIS, COMPLETE (UACMP) WITH MICROSCOPIC
Bacteria, UA: NONE SEEN
Bilirubin Urine: NEGATIVE
Glucose, UA: >=500 mg/dL — AB
Ketones, ur: NEGATIVE mg/dL
Leukocytes, UA: NEGATIVE
Nitrite: NEGATIVE
PH: 5 (ref 5.0–8.0)
Protein, ur: NEGATIVE mg/dL
SQUAMOUS EPITHELIAL / LPF: NONE SEEN (ref 0–5)
Specific Gravity, Urine: 1.022 (ref 1.005–1.030)

## 2018-02-12 LAB — BASIC METABOLIC PANEL
Anion gap: 14 (ref 5–15)
Anion gap: 11 (ref 5–15)
Anion gap: 7 (ref 5–15)
BUN: 65 mg/dL — AB (ref 6–20)
BUN: 61 mg/dL — ABNORMAL HIGH (ref 6–20)
BUN: 53 mg/dL — ABNORMAL HIGH (ref 6–20)
CALCIUM: 9.2 mg/dL (ref 8.9–10.3)
CHLORIDE: 98 mmol/L (ref 98–111)
CHLORIDE: 106 mmol/L (ref 98–111)
CO2: 21 mmol/L — ABNORMAL LOW (ref 22–32)
CO2: 23 mmol/L (ref 22–32)
CO2: 23 mmol/L (ref 22–32)
CREATININE: 2.43 mg/dL — AB (ref 0.61–1.24)
Calcium: 9.7 mg/dL (ref 8.9–10.3)
Calcium: 9.9 mg/dL (ref 8.9–10.3)
Chloride: 87 mmol/L — ABNORMAL LOW (ref 98–111)
Creatinine, Ser: 2.88 mg/dL — ABNORMAL HIGH (ref 0.61–1.24)
Creatinine, Ser: 2.63 mg/dL — ABNORMAL HIGH (ref 0.61–1.24)
GFR calc Af Amer: 27 mL/min — ABNORMAL LOW (ref >60)
GFR calc Af Amer: 30 mL/min — ABNORMAL LOW (ref >60)
GFR calc Af Amer: 33 mL/min — ABNORMAL LOW (ref >60)
GFR calc non Af Amer: 23 mL/min — ABNORMAL LOW (ref >60)
GFR calc non Af Amer: 26 mL/min — ABNORMAL LOW (ref >60)
GFR calc non Af Amer: 29 mL/min — ABNORMAL LOW (ref >60)
Glucose, Bld: 1177 mg/dL (ref 70–99)
Glucose, Bld: 777 mg/dL (ref 70–99)
Glucose, Bld: 249 mg/dL — ABNORMAL HIGH (ref 70–99)
POTASSIUM: 5.8 mmol/L — AB (ref 3.5–5.1)
Potassium: 5.7 mmol/L — ABNORMAL HIGH (ref 3.5–5.1)
Potassium: 4 mmol/L (ref 3.5–5.1)
Sodium: 122 mmol/L — ABNORMAL LOW (ref 135–145)
Sodium: 132 mmol/L — ABNORMAL LOW (ref 135–145)
Sodium: 136 mmol/L (ref 135–145)

## 2018-02-12 LAB — BLOOD GAS, VENOUS
Acid-base deficit: 2.3 mmol/L — ABNORMAL HIGH (ref 0.0–2.0)
Bicarbonate: 22.5 mmol/L (ref 20.0–28.0)
O2 Saturation: 94.2 %
PH VEN: 7.38 (ref 7.250–7.430)
Patient temperature: 37
pCO2, Ven: 38 mmHg — ABNORMAL LOW (ref 44.0–60.0)
pO2, Ven: 73 mmHg — ABNORMAL HIGH (ref 32.0–45.0)

## 2018-02-12 LAB — PROTIME-INR
INR: 1.13
Prothrombin Time: 14.4 seconds (ref 11.4–15.2)

## 2018-02-12 LAB — CBC WITH DIFFERENTIAL/PLATELET
Basophils Absolute: 0 10*3/uL (ref 0.0–0.2)
Basos: 0 %
EOS (ABSOLUTE): 0.2 10*3/uL (ref 0.0–0.4)
Eos: 1 %
Hematocrit: 46 % (ref 37.5–51.0)
Hemoglobin: 14.6 g/dL (ref 13.0–17.7)
Immature Grans (Abs): 0.1 10*3/uL (ref 0.0–0.1)
Immature Granulocytes: 1 %
Lymphocytes Absolute: 1.2 10*3/uL (ref 0.7–3.1)
Lymphs: 9 %
MCH: 28.2 pg (ref 26.6–33.0)
MCHC: 31.7 g/dL (ref 31.5–35.7)
MCV: 89 fL (ref 79–97)
MONOCYTES: 6 %
Monocytes Absolute: 0.8 10*3/uL (ref 0.1–0.9)
Neutrophils Absolute: 11.1 10*3/uL — ABNORMAL HIGH (ref 1.4–7.0)
Neutrophils: 83 %
Platelets: 244 10*3/uL (ref 150–450)
RBC: 5.17 x10E6/uL (ref 4.14–5.80)
RDW: 12.6 % (ref 11.6–15.4)
WBC: 13.3 10*3/uL — ABNORMAL HIGH (ref 3.4–10.8)

## 2018-02-12 LAB — THYROID PANEL WITH TSH
Free Thyroxine Index: 2 (ref 1.2–4.9)
T3 Uptake Ratio: 29 % (ref 24–39)
T4, Total: 7 ug/dL (ref 4.5–12.0)
TSH: 1.2 u[IU]/mL (ref 0.450–4.500)

## 2018-02-12 LAB — HEMOGLOBIN A1C
Est. average glucose Bld gHb Est-mCnc: 252 mg/dL
HEMOGLOBIN A1C: 10.4 % — AB (ref 4.8–5.6)

## 2018-02-12 LAB — HEPARIN LEVEL (UNFRACTIONATED): Heparin Unfractionated: 0.83 IU/mL — ABNORMAL HIGH (ref 0.30–0.70)

## 2018-02-12 LAB — MRSA PCR SCREENING: MRSA by PCR: NEGATIVE

## 2018-02-12 LAB — APTT: aPTT: 28 seconds (ref 24–36)

## 2018-02-12 LAB — MAGNESIUM
Magnesium: 2.4 mg/dL (ref 1.7–2.4)
Magnesium: 1.9 mg/dL (ref 1.7–2.4)

## 2018-02-12 LAB — PHOSPHORUS: Phosphorus: 4.2 mg/dL (ref 2.5–4.6)

## 2018-02-12 MED ORDER — HEPARIN SODIUM (PORCINE) 5000 UNIT/ML IJ SOLN
4000.0000 [IU] | Freq: Once | INTRAMUSCULAR | Status: AC
  Administered 2018-02-12: 4000 [IU] via INTRAVENOUS
  Filled 2018-02-12: qty 1

## 2018-02-12 MED ORDER — HEPARIN (PORCINE) 25000 UT/250ML-% IV SOLN
1000.0000 [IU]/h | INTRAVENOUS | Status: DC
  Administered 2018-02-12 – 2018-02-13 (×2): 1150 [IU]/h via INTRAVENOUS
  Administered 2018-02-14: 1000 [IU]/h via INTRAVENOUS
  Filled 2018-02-12 (×3): qty 250

## 2018-02-12 MED ORDER — SODIUM CHLORIDE 0.9 % IV BOLUS
1000.0000 mL | Freq: Once | INTRAVENOUS | Status: AC
  Administered 2018-02-12: 1000 mL via INTRAVENOUS

## 2018-02-12 MED ORDER — DEXTROSE 50 % IV SOLN: 25.0000 mL | INTRAVENOUS | Status: DC | PRN

## 2018-02-12 MED ORDER — SODIUM CHLORIDE 0.9 % IV SOLN
1.0000 g | Freq: Once | INTRAVENOUS | Status: DC
  Filled 2018-02-12: qty 10

## 2018-02-12 MED ORDER — SODIUM CHLORIDE 0.9 % IV SOLN: 1.0000 g | Freq: Once | INTRAVENOUS | Status: DC

## 2018-02-12 MED ORDER — INSULIN ASPART 100 UNIT/ML ~~LOC~~ SOLN
10.0000 [IU] | Freq: Once | SUBCUTANEOUS | Status: AC
  Administered 2018-02-12: 10 [IU] via INTRAVENOUS
  Filled 2018-02-12: qty 1

## 2018-02-12 MED ORDER — SODIUM CHLORIDE 0.9 % IV SOLN
INTRAVENOUS | Status: DC
  Administered 2018-02-12: 1000 mL via INTRAVENOUS

## 2018-02-12 MED ORDER — ASPIRIN EC 325 MG PO TBEC: 325.0000 mg | DELAYED_RELEASE_TABLET | Freq: Every day | ORAL | Status: DC

## 2018-02-12 MED ORDER — INSULIN REGULAR BOLUS VIA INFUSION
0.0000 [IU] | Freq: Three times a day (TID) | INTRAVENOUS | Status: DC
  Filled 2018-02-12: qty 10

## 2018-02-12 MED ORDER — DEXTROSE-NACL 5-0.45 % IV SOLN: INTRAVENOUS | Status: DC

## 2018-02-12 MED ORDER — INSULIN REGULAR(HUMAN) IN NACL 100-0.9 UT/100ML-% IV SOLN
INTRAVENOUS | Status: DC
  Filled 2018-02-12 (×2): qty 100

## 2018-02-12 MED ORDER — ATORVASTATIN CALCIUM 20 MG PO TABS
40.0000 mg | ORAL_TABLET | Freq: Every day | ORAL | Status: DC
  Administered 2018-02-12 – 2018-02-13 (×2): 40 mg via ORAL
  Filled 2018-02-12 (×2): qty 2

## 2018-02-12 MED ORDER — CALCIUM GLUCONATE-NACL 1-0.675 GM/50ML-% IV SOLN
1.0000 g | Freq: Once | INTRAVENOUS | Status: AC
  Administered 2018-02-12: 1000 mg via INTRAVENOUS
  Filled 2018-02-12: qty 50

## 2018-02-12 MED ORDER — INSULIN REGULAR(HUMAN) IN NACL 100-0.9 UT/100ML-% IV SOLN
INTRAVENOUS | Status: DC
  Administered 2018-02-12: 5.4 [IU]/h via INTRAVENOUS

## 2018-02-12 MED ORDER — INSULIN DETEMIR 100 UNIT/ML ~~LOC~~ SOLN
5.0000 [IU] | Freq: Two times a day (BID) | SUBCUTANEOUS | Status: DC
  Administered 2018-02-12: 5 [IU] via SUBCUTANEOUS
  Filled 2018-02-12 (×2): qty 0.05

## 2018-02-12 MED ORDER — SODIUM CHLORIDE 0.9% FLUSH
3.0000 mL | Freq: Once | INTRAVENOUS | Status: AC
  Administered 2018-02-12: 3 mL via INTRAVENOUS

## 2018-02-12 MED ORDER — SODIUM CHLORIDE 0.9 % IV SOLN
INTRAVENOUS | Status: DC
  Administered 2018-02-12 – 2018-02-13 (×2): via INTRAVENOUS

## 2018-02-12 MED ORDER — SODIUM CHLORIDE 0.9 % IV SOLN: INTRAVENOUS | Status: DC

## 2018-02-12 MED ORDER — HYDRALAZINE HCL 50 MG PO TABS
100.0000 mg | ORAL_TABLET | Freq: Two times a day (BID) | ORAL | Status: DC
  Administered 2018-02-12 – 2018-02-15 (×7): 100 mg via ORAL
  Filled 2018-02-12 (×7): qty 2

## 2018-02-12 MED ORDER — LIVING WELL WITH DIABETES BOOK
Freq: Once | Status: AC
  Administered 2018-02-12: 14:00:00
  Filled 2018-02-12: qty 1

## 2018-02-12 MED ORDER — ASPIRIN 81 MG PO CHEW
324.0000 mg | CHEWABLE_TABLET | Freq: Once | ORAL | Status: AC
  Administered 2018-02-12: 324 mg via ORAL
  Filled 2018-02-12: qty 4

## 2018-02-12 MED ORDER — METOPROLOL TARTRATE 25 MG PO TABS
25.0000 mg | ORAL_TABLET | Freq: Two times a day (BID) | ORAL | Status: DC
  Administered 2018-02-12 – 2018-02-15 (×6): 25 mg via ORAL
  Filled 2018-02-12 (×7): qty 1

## 2018-02-12 MED ORDER — ASPIRIN EC 81 MG PO TBEC
81.0000 mg | DELAYED_RELEASE_TABLET | Freq: Every day | ORAL | Status: DC
  Administered 2018-02-13 – 2018-02-15 (×3): 81 mg via ORAL
  Filled 2018-02-12 (×3): qty 1

## 2018-02-12 MED ORDER — INSULIN ASPART 100 UNIT/ML ~~LOC~~ SOLN: 1.0000 [IU] | SUBCUTANEOUS | Status: DC

## 2018-02-12 NOTE — Progress Notes (Signed)
Tolerating Insulin and Heparin drips well. Receptive to Diabetic teaching. Understanding his hypertension more. As of 6 pm remains on NS- blood sugar still above 250.

## 2018-02-12 NOTE — ED Notes (Signed)
Awaiting insulin drip from pharmacy. 

## 2018-02-12 NOTE — ED Notes (Signed)
Pt to xray

## 2018-02-12 NOTE — Progress Notes (Addendum)
Have advised patient to go into ER this morning for further work-up due to lab results.

## 2018-02-12 NOTE — Consult Note (Signed)
Cardiology Consultation:   Anthony Sellers ID: Anthony DANNEMILLER Sr. MRN: [id number]; DOB: Apr 20, 1960  Admit date: 02/12/2018 Date of Consult: 02/12/2018  Primary Care Provider: Valerie Roys, DO Primary Cardiologist: Iverson Alamin, Dr. Saunders Revel Primary Electrophysiologist:  None    Anthony Sellers Profile:   Anthony Feil Sr. is a 58 y.o. male with a hx of hypertension, DM2, hyperlipidemia, CKD 3, and gout who is being seen today for the evaluation of admitted troponin with DKA at the request of Dr. Stark Jock.  History of Present Illness:   Anthony Sellers is a 58 year old male with no known previous cardiac history other than hypertension.  PTA medications include hydralazine 100 mg twice daily, lisinopril 40 mg daily, and spironolactone 25 mg twice daily.  No known family history of heart disease. Previous smoker (quit 4 - 5 years ago ~ 2015). Previous alcohol use, stating he " used to drink the occasional beer." No illegal drug use.   01/29/2018 visit to PCP for gout flare.  Per EMR, initial gout flare was on 01/21/2018 and treated with 5 days of prednisone 40 mg with initial alleviation of sx but return of pain shortly thereafter.  On 1/7, Anthony Sellers again saw PCP  With recommendation for lengthier prednisone taper over 12 days, as well as allopurinol.  BMP and uric acid labs were obtained. Of note, vitals at the time of his visit significant for elevation in SBP.  However, it was suspected this elevation was due to his pain, and no changes were made to antihypertensive medication regimen. It was noted to recheck his BP in 4 weeks.  On 02/11/2018, he presented to his PCP again d/t the recommendation / urging of his wife as he had been c/o progressive lethargy and "not feeling like himself" for 2-3 days. Per EMR, it was thoughtthat his fatigue was primarily due to elevated glucose with Anthony Sellers also reporting associated sx of polydipsia and polyuria. He did note reduced appetite. Workup included an EKG with no acute changes  compared with previous EKG - sinus tachycardia, ectopy, IVCD and repolarization abnormalities noted.  Labs obtained included  A1C, CBC, CMP, and TSH.    After this appointment at [facility], the Anthony Sellers returned home. That evening (1/20), the Anthony Sellers reportedly was using the restroom (frequent urination reported with increased trips to restroom) when he had an episode of chest pain, rated 7/10 in severity.  He described the left sided chest pain as both sharp and dull, nonpleuritic, and nonradiating. It was located around his 5th ICS. Not TTP. No associated shortness of breath, dizziness / presyncope, or diaphoresis reported.  No associated palpitations or feelings of racing heart rate. He did note reduced appetite but did not feel this was associated with the chest pain. No associated nausea or vomiting.  He stated this chest pain lasted for approximately 5 to 10 minutes then subsided without recurrence since that time. He did notice alleviating factors included raising his left arm above his head; however, no aggravating factors reported.  He stated he has never had chest pain like this before in the past. At the time, he thought the chest pain might be due to his elevated blood pressure.  He reported he often feels lightheaded when his blood pressure is elevated; however, he did not feel lightheaded during this episode of chest pain. Of note, he also noted that he has skipped the last few days of his antihypertensive medication (leading up to this admission). He did not want to take his medications  on an empty stomach, and he reportedly had reduced appeite.  Today (1/21), his PCP called after receiving the results of the previous day's labs and requested he present to the ED  for further work-up given his elevated glucose of 975, low sodium, and elevated potassium of 5.7. At that time, the Anthony Sellers reported his new chest pain to the PCP with recommendation for cardiology evaluation as well.     In the ED, Anthony Sellers was hypertensive with BP 151/108 and tachycardic with rate 107 bpm. Labs showed hyperglycemia, hyponatremia, hyperkalemia. Renal function reduced with Cr 2.88 and BUN of 65. WBC 14.9. Troponin elevated at 0.77. Hepatic function panel with bilirubin 1.7 and indirect bilirubin 1.5.  Anion gap 14.  UA positive for hematuria.  EKG showed sinus tachycardia with rate 116, ectopy with PVCs, peaked T waves consistent with hyperkalemia, non-specific ST /T wave changes d/t repolarization abnormality, and no acute ST elevation.  Chest x-ray negative for edema, effusion, or pneumothorax.  Anthony Sellers was admitted to the ICU with DKA for further management.  Cardiology consulted for elevated troponin.  In the ED, he was started on insulin for DKA, as well as aspirin and heparin d/t elevated troponin.  He was also started on IVF due to acute renal insufficiency and in addition to the calcium gluconate received on arrival to the ED.    Past Medical History:  Diagnosis Date  . Diabetes mellitus without complication ([place])   . Enlarged prostate   . Gout   . Hyperlipidemia   . Hypertension   . IFG (impaired fasting glucose)   . Vitamin D deficiency     History reviewed. No pertinent surgical history.   Home Medications:  Prior to Admission medications   Medication Sig Start Date End Date Taking? Authorizing Provider  allopurinol (ZYLOPRIM) 100 MG tablet Take 0.5 tablets (50 mg total) by mouth daily. Anthony Sellers taking differently: Take 100 mg by mouth daily.  01/29/18  Yes Cannady, Jolene T, NP  chlorthalidone (HYGROTON) 25 MG tablet Take 1 tablet (25 mg total) by mouth daily. 01/21/18  Yes Cannady, Jolene T, NP  hydrALAZINE (APRESOLINE) 100 MG tablet Take 1 tablet (100 mg total) by mouth 2 (two) times daily. 11/02/17  Yes Johnson, [name], DO  lisinopril (PRINIVIL,ZESTRIL) 40 MG tablet Take 1 tablet (40 mg total) by mouth daily. 11/02/17  Yes Johnson, [name], DO  spironolactone (ALDACTONE) 25 MG  tablet Take 1 tablet (25 mg total) by mouth 2 (two) times daily. 11/02/17  Yes Valerie Roys, DO    Inpatient Medications: Scheduled Meds: . [START ON 02/13/2018] aspirin EC  325 mg Oral Daily  . atorvastatin  40 mg Oral q1800  . hydrALAZINE  100 mg Oral BID  . insulin regular  0-10 Units Intravenous TID WC   Continuous Infusions: . sodium chloride 1,000 mL (02/12/18 1002)  . dextrose 5 % and 0.45% NaCl    . dextrose 5 % and 0.45% NaCl    . heparin 1,150 Units/hr (02/12/18 0946)  . insulin     PRN Meds: dextrose  Allergies:   No Known Allergies  Social History:   Social History   Socioeconomic History  . Marital status: Married    Spouse name: Not on file  . Number of children: Not on file  . Years of education: Not on file  . Highest education level: Not on file  Occupational History  . Not on file  Social Needs  . Financial resource strain: Not on file  .  Food insecurity:    Worry: Not on file    Inability: Not on file  . Transportation needs:    Medical: Not on file    Non-medical: Not on file  Tobacco Use  . Smoking status: Former Smoker    Packs/day: 0.25    Types: Cigarettes  . Smokeless tobacco: Never Used  Substance and Sexual Activity  . Alcohol use: Yes    Comment: on occasion  . Drug use: No  . Sexual activity: Never  Lifestyle  . Physical activity:    Days per week: Not on file    Minutes per session: Not on file  . Stress: Not on file  Relationships  . Social connections:    Talks on phone: Not on file    Gets together: Not on file    Attends religious service: Not on file    Active member of club or organization: Not on file    Attends meetings of clubs or organizations: Not on file    Relationship status: Not on file  . Intimate partner violence:    Fear of current or ex partner: Not on file    Emotionally abused: Not on file    Physically abused: Not on file    Forced sexual activity: Not on file  Other Topics Concern  . Not on  file  Social History Narrative  . Not on file    Family History:   No known family history of heart failure or CAD Family History  Problem Relation Age of Onset  . Hypertension Mother   . Alzheimer's disease Father   . Hypertension Brother   . Cancer Maternal Grandfather        Prostate  . Cancer Sister        Breast Cancer     ROS:  Please see the history of present illness.  Review of Systems  Constitutional: Positive for malaise/fatigue.       Appetite reduced  Respiratory: Negative for shortness of breath.   Cardiovascular: Positive for chest pain. Negative for palpitations, orthopnea and leg swelling.       No current CP. CP as in HPI on 1/20. No leg swelling but does report gout  Gastrointestinal: Negative for abdominal pain, blood in stool, nausea and vomiting.  Genitourinary: Positive for frequency and urgency. Negative for flank pain and hematuria.  Musculoskeletal: Negative for back pain, falls and joint pain.  Neurological: Negative for dizziness.  Endo/Heme/Allergies: Positive for polydipsia.  Psychiatric/Behavioral: Negative for substance abuse.  All other systems reviewed and are negative.   All other ROS reviewed and negative.     Physical Exam/Data:   Vitals:   02/12/18 1112 02/12/18 1218 02/12/18 1300 02/12/18 1400  BP: (!) 142/87 (!) 151/104 (!) 162/117 (!) 135/113  Pulse: 90 86 94 94  Resp: 20 15 17  (!) 25  Temp:  98.1 F (36.7 C)    TempSrc:  Axillary    SpO2: 97% 97% 96% 97%  Weight:      Height:        Intake/Output Summary (Last 24 hours) at 02/12/2018 1407 Last data filed at 02/12/2018 1200 Gross per 24 hour  Intake -  Output 200 ml  Net -200 ml   Filed Weights   02/12/18 0722 02/12/18 0732  Weight: 93.4 kg 93.9 kg   Body mass index is 33.41 kg/m.  General:  Well nourished, well developed, in no acute distress HEENT: normal Neck: no JVD Vascular: Radial pulses 2+  Cardiac:  normal S1, S2; tachycardic but regular, no murmur    Lungs:  clear to auscultation bilaterally, no wheezing, rhonchi or rales  Abd: soft, nontender, no hepatomegaly  Ext: no bilateral lower extremity edema Musculoskeletal:  No deformities,  Skin: warm and dry (dry skin of b/l lower extremities) Neuro: no focal abnormalities noted Psych:  Normal affect   EKG:  The EKG was personally reviewed and demonstrates:  As in HPI Telemetry:  Telemetry was personally reviewed and demonstrates:  Sinus tachycardia with ectopy and repolarization abnormalities with wide QRS/IVCD  Relevant CV Studies: Pending echo  Laboratory Data:  Chemistry Recent Labs  Lab 02/11/18 1700 02/12/18 0739 02/12/18 1039  NA 125* 122* 132*  K 5.7* 5.8* 5.7*  CL 83* 87* 98  CO2 21 21* 23  GLUCOSE 975* 1,177* 777*  BUN 48* 65* 61*  CREATININE 2.74* 2.88* 2.63*  CALCIUM 9.9 9.7 9.9  GFRNONAA 25* 23* 26*  GFRAA 29* 27* 30*  ANIONGAP  --  14 11    Recent Labs  Lab 02/11/18 1700 02/12/18 0739  PROT 7.3 7.5  ALBUMIN 4.5 4.1  AST 13 23  ALT 18 23  ALKPHOS 92 79  BILITOT 1.0 1.7*   Hematology Recent Labs  Lab 02/11/18 1700 02/12/18 0739  WBC 13.3* 14.9*  RBC 5.17 5.31  HGB 14.6 14.5  HCT 46.0 46.8  MCV 89 88.1  MCH 28.2 27.3  MCHC 31.7 31.0  RDW 12.6 13.6  PLT 244 238   Cardiac Enzymes Recent Labs  Lab 02/12/18 0739 02/12/18 1039  TROPONINI 0.77* 1.64*   No results for input(s): TROPIPOC in the last 168 hours.  BNPNo results for input(s): BNP, PROBNP in the last 168 hours.  DDimer No results for input(s): DDIMER in the last 168 hours.  Radiology/Studies:  Dg Chest 2 View  Result Date: 02/12/2018 CLINICAL DATA:  Chest pain EXAM: CHEST - 2 VIEW COMPARISON:  11/03/2016 FINDINGS: Low lung volumes with minor central bronchitic change. No focal pneumonia, collapse or consolidation. Negative for edema, effusion or pneumothorax. Trachea is midline. Normal heart size and vascularity. IMPRESSION: Low volume exam with minor central bronchitic change.  No focal pneumonia. Electronically Signed   By: Jerilynn Mages.  Shick M.D.   On: 02/12/2018 08:24    Assessment and Plan:   Elevated troponin with chest pain at moderate risk for cardiac etiology - No current chest pain.  Chest pain 1/20 at rest as above in HPI.   - Troponin initially elevated at 0.77, now uptrending to 2.03.  Continue to cycle. - At least at moderate risk for cardiac etiology given Anthony Sellers risk factors for CAD including previous history of smoking, uncontrolled hypertension, hyperlipidemia, diabetes with uncontrolled glucose, and obesity. Chest pain history moderately suspicious for ACS / NSTEMI with EKG showing sinus tachycardia and nonspecific repolarization abnormalities but no acute STE. Also pending updated lipid panel to further risk stratify, with LDL 127 on 11/02/2017. Consider TSH update as well. Echo pending.  -Continue ASA, Lipitor, and heparin drip.  Daily CBC/BMET recommended as on heparin and to monitor kidney function and electrolytes. Continue to hold ACE/ARB and spironolactone due to AKI and hyperkalemia. Anthony Sellers started on low-dose metoprolol 25 mg twice daily given elevated blood pressure since admission in room and tachycardic rates. Continue BB and titrate as needed for heart rate and blood pressure control.   - Echocardiogram performed today 1/21 and pending results. Further recommendations following results. -Will defer ischemic workup until stabilized from DKA/AKI.  At this time, Anthony Sellers is  not an ideal candidate for invasive ischemic work-up given DKA and would not recommend contrast procedure with elevated creatinine.  Will plan for stress test later this admission, and if further bump in troponin, cardiac catheterization for further ischemic evaluation.    HTN -Started on low-dose metoprolol.  Titrate as heart rate and blood pressure allow.  To need to hold ACE/ARB and spironolactone due to AKI and hyperkalemia.  Darted on IVF due to AKI with recommendation to monitor  BP closely. - Preliminary reading of echocardiogram suggests concentric hypertrophy, consistent with elevated blood pressure.  Will need tight control of blood pressure in the future to prevent progression of cardiac structural changes.  AKI with CKD 3 -2017 renal ultrasound without RAS. Current admission UA with hematuria. Cr elevated at 2.63 and BUN 61. Renal function likely worsened by uncontrolled HTN and DM2. - Started on IVF. Continue to monitor blood pressure and volume status on IVF (daily weights, I/O documented, physical exam).  - Continue to hold ACE/ARB/spiro with AKI/ elevated K.. Daily BMET to monitor renal function and electrolytes.  - Per nephrology.  DKA - Recommend continue to monitor glucose and potassium (as below).  - Per crit care  Hyperkalemia - Continue to monitor -insulin started and likely will result in K reduction / potassium decrease 2/2 insulin administration - Recommend daily bmet  HLD - Pending updated lipid panel - Goal LDL <70 - Continue statin therapy    For questions or updates, please contact [facility] Please consult [email or web address] for contact info under     Signed, Arvil Chaco, PA-C  02/12/2018 2:07 PM

## 2018-02-12 NOTE — Care Plan (Signed)
Patient is a 58 year old gentleman with a history of gout who recently has been treated with steroids for gout.  He was admitted to the intensive care unit/stepdown for management of severe hyperglycemia and acute kidney injury that has ensued.  He has required insulin infusion.  Nephrology is following for his issues with acute kidney injury.  In addition he has had issues with chest pain and elevated troponins that are being followed by cardiology.  By 2D echo performed today it appears that he may have hypertrophic cardiomyopathy.  Patient is otherwise hemodynamically stable and all protocols for his hyperglycemia management are in place.  Critical care will remain available if needed and will assist with management in the ICU but have little to add to his otherwise excellent care.  Appropriate consultants are already involved.

## 2018-02-12 NOTE — Progress Notes (Signed)
Spoke with Darlyn Chamber, NP and made him aware that patient had a brief run of SVT rate 180's and that metoprolol is due at 2200.  NP acknowledged.  No new orders given at this time.

## 2018-02-12 NOTE — ED Provider Notes (Addendum)
[facility] Emergency Department Provider Note  ____________________________________________  Time seen: Approximately 8:08 AM  I have reviewed the triage vital signs and the nursing notes.   HISTORY  Chief Complaint Chest Pain and Hyperglycemia    HPI Anthony Sellers. is a 58 y.o. male with a history of HTN, HL, diet-controlled diabetes, recent gout flare with recent 12-day prednisone taper presenting for hyperglycemia and chest pain.  The patient reports that for the past 2 to 3 days, he has been extremely thirsty with frequent urination, and went to see his primary care physician yesterday.  He underwent routine blood work, and was called this morning for a blood sugar of greater than 900 and abnormal sodium and potassium.  The patient also reports that for the past 2 nights, he has been urinating frequently at night and when he gets up he develops a sharp left-sided chest pain which is so severe that he has to lay on the ground and raise his left arm.  When he pushes on his chest it improves.  He has some mild associated shortness of breath without any palpitations, lightheadedness or syncope, diaphoresis, nausea or vomiting.  He has not been experiencing any chest pain with exertion during the day.  He has not had any lower extremity swelling or calf pain.  He has not had any cough or cold symptoms, fevers or chills.  At this time, the patient is not experiencing any chest pain but he does have a dry mouth sensation.  Past Medical History:  Diagnosis Date  . Diabetes mellitus without complication ([place])   . Enlarged prostate   . Gout   . Hyperlipidemia   . Hypertension   . IFG (impaired fasting glucose)   . Vitamin D deficiency     Patient Active Problem List   Diagnosis Date Noted  . Fatigue 02/11/2018  . Acute gout due to renal impairment involving toe of right foot 01/21/2018  . Dermatitis 01/21/2018  . Chronic kidney disease, stage 3 (moderate)  ([place]) 08/23/2015  . Sleep apnea 08/20/2015  . Hyperlipidemia   . Hypertensive heart/kidney disease without HF and with CKD stage III ([place])   . IFG (impaired fasting glucose)   . Vitamin D deficiency   . Enlarged prostate     History reviewed. No pertinent surgical history.  Current Outpatient Rx  . Order #: [id number] Class: Normal  . Order #: [id number] Class: Normal  . Order #: [id number] Class: Normal  . Order #: [id number] Class: Normal  . Order #: [id number] Class: Normal    Allergies Patient has no known allergies.  Family History  Problem Relation Age of Onset  . Hypertension Mother   . Alzheimer's disease Father   . Hypertension Brother   . Cancer Maternal Grandfather        Prostate  . Cancer Sister        Breast Cancer    Social History Social History   Tobacco Use  . Smoking status: Former Smoker    Packs/day: 0.25    Types: Cigarettes  . Smokeless tobacco: Never Used  Substance Use Topics  . Alcohol use: Yes    Comment: on occasion  . Drug use: No    Review of Systems Constitutional: No fever/chills.  No lightheadedness or syncope.  No diaphoresis. Eyes: No visual changes. ENT: No sore throat. No congestion or rhinorrhea. Cardiovascular: As of left-sided chest pain. Denies palpitations. Respiratory: Denies shortness of breath.  No cough. Gastrointestinal: No abdominal pain.  No nausea, no  vomiting.  No diarrhea.  No constipation. Genitourinary: Negative for dysuria. + Polyuria. Musculoskeletal: Negative for back pain. Skin: Negative for rash. Neurological: Negative for headaches. No focal numbness, tingling or weakness.  Endocrine:Acid of hyperglycemia.  Positive dry mouth.  Positive polyuria and polydipsia.  ____________________________________________   PHYSICAL EXAM:  VITAL SIGNS: ED Triage Vitals  Enc Vitals Group     BP 02/12/18 0730 (!) 151/108     Pulse Rate 02/12/18 0730 (!) 107     Resp 02/12/18 0730 15     Temp 02/12/18 0731 97.6 F  (36.4 C)     Temp Source 02/12/18 0731 Oral     SpO2 02/12/18 0730 97 %     Weight 02/12/18 0722 205 lb 14.6 oz (93.4 kg)     Height 02/12/18 0722 5\' 6"  (1.676 m)     Head Circumference --      Peak Flow --      Pain Score 02/12/18 0722 0     Pain Loc --      Pain Edu? --      Excl. in [place]? --     Constitutional: Alert and oriented. Answers questions appropriately. Eyes: Conjunctivae are normal.  EOMI. No scleral icterus. Head: Atraumatic. Nose: No congestion/rhinnorhea. Mouth/Throat: Mucous membranes are very dry.  Patient has dentures in place.  Neck: No stridor.  Supple.  No JVD.  No meningismus. Cardiovascular: Normal rate, regular rhythm. No murmurs, rubs or gallops.  Respiratory: Normal respiratory effort.  No accessory muscle use or retractions. Lungs CTAB.  No wheezes, rales or ronchi. Gastrointestinal: Soft, nontender and nondistended.  No guarding or rebound.  No peritoneal signs. Musculoskeletal: No LE edema. No ttp in the calves or palpable cords.  Negative Homan's sign. Neurologic:  A&Ox3.  Speech is clear.  Face and smile are symmetric.  EOMI.  Moves all extremities well. Skin:  Skin is warm, dry and intact. No rash noted. Psychiatric: Mood and affect are normal. Speech and behavior are normal.  Normal judgement  ____________________________________________   LABS (all labs ordered are listed, but only abnormal results are displayed)  Labs Reviewed  GLUCOSE, CAPILLARY - Abnormal; Notable for the following components:      Result Value   Glucose-Capillary >600 (*)    All other components within normal limits  BASIC METABOLIC PANEL - Abnormal; Notable for the following components:   Sodium 122 (*)    Potassium 5.8 (*)    Chloride 87 (*)    CO2 21 (*)    Glucose, Bld 1,177 (*)    BUN 65 (*)    Creatinine, Ser 2.88 (*)    GFR calc non Af Amer 23 (*)    GFR calc Af Amer 27 (*)    All other components within normal limits  CBC - Abnormal; Notable for the  following components:   WBC 14.9 (*)    All other components within normal limits  TROPONIN I - Abnormal; Notable for the following components:   Troponin I 0.77 (*)    All other components within normal limits  HEPATIC FUNCTION PANEL - Abnormal; Notable for the following components:   Total Bilirubin 1.7 (*)    Indirect Bilirubin 1.5 (*)    All other components within normal limits  BLOOD GAS, VENOUS - Abnormal; Notable for the following components:   pCO2, Ven 38 (*)    pO2, Ven 73.0 (*)    Acid-base deficit 2.3 (*)    All other components within normal limits  URINALYSIS, COMPLETE (UACMP) WITH MICROSCOPIC - Abnormal; Notable for the following components:   Color, Urine STRAW (*)    APPearance CLEAR (*)    Glucose, UA >=500 (*)    Hgb urine dipstick SMALL (*)    All other components within normal limits  PROTIME-INR  APTT   ____________________________________________  EKG  ED ECG REPORT I, Anne-Caroline Mariea Clonts, the attending physician, personally viewed and interpreted this ECG.   Date: 02/12/2018  EKG Time: 721  Rate: 116  Rhythm: sinus tachycardia  Axis: leftward  Intervals:none  ST&T Change: The patient has peaked T waves in V2 through V5 with 0.5 to 1.0 mm of ST elevation without tombstone morphology.  There are no reciprocal changes.  Positive PVC.   Repeat EKG: ED ECG REPORT I, Anne-Caroline Mariea Clonts, the attending physician, personally viewed and interpreted this ECG.   Date: 02/12/2018  EKG Time: 730  Rate: 107  Rhythm: sinus tachycardia; positive PVC  Axis: leftward  Intervals:none  ST&T Change: Peak T waves in V2 through V5 with continued 0.5 mm ST elevation in the same leads.  No STEMI.  No reciprocal changes.   Repeat EKG: ED ECG REPORT I, Anne-Caroline Mariea Clonts, the attending physician, personally viewed and interpreted this ECG.   Date: 02/12/2018  EKG Time: 736  Rate: 106  Rhythm: sinus tachycardia; + PVC  Axis: leftward  Intervals:none   ST&T Change: Peaked T waves in V2 through V5 without QRS widening.  No STEMI.  ____________________________________________  RADIOLOGY  Dg Chest 2 View  Result Date: 02/12/2018 CLINICAL DATA:  Chest pain EXAM: CHEST - 2 VIEW COMPARISON:  11/03/2016 FINDINGS: Low lung volumes with minor central bronchitic change. No focal pneumonia, collapse or consolidation. Negative for edema, effusion or pneumothorax. Trachea is midline. Normal heart size and vascularity. IMPRESSION: Low volume exam with minor central bronchitic change. No focal pneumonia. Electronically Signed   By: Jerilynn Mages.  Shick M.D.   On: 02/12/2018 08:24    ____________________________________________   PROCEDURES  Procedure(s) performed: None  Procedures  Critical Care performed: Yes, see critical care note(s) ____________________________________________   INITIAL IMPRESSION / ASSESSMENT AND PLAN / ED COURSE  Pertinent labs & imaging results that were available during my care of the patient were reviewed by me and considered in my medical decision making (see chart for details).  58 y.o. male with a history of HTN, HL, recent gout flare on prednisone, diet controlled diabetes, presenting for abnormal blood work that was done yesterday.  Overall, given the patient's polyuria, polydipsia and recent steroid use, I am concerned about DKA.  His blood sugar here is greater than 600 and I am awaiting the results of his VBG and anion gap but have initiated treatment with insulin and fluids.  It is possible that the steroids induced his hyperglycemia, but other causes will also be evaluated, including ACS or MI given his intermittent chest pain, or infection.  Will likely require admission.  ----------------------------------------- 8:52 AM on 02/12/2018 -----------------------------------------  The patient does have an elevated troponin today at 0.7; aspirin and heparin have been ordered.  I will consult the cardiologist given the  patient's ST changes and significant hyperglycemia.  I anticipate improvement of his underlying endocrinopathy prior to catheterization, but will let the cardiology physician weigh in.  ----------------------------------------- 9:28 AM on 02/12/2018 -----------------------------------------  Did speak with Dr. end, the cardiologist on-call, who does agree with the plan for management of the patient's DKA, and reevaluation of ACS or MI secondarily.  He will consult on  the patient.  ----------------------------------------- 9:33 AM on 02/12/2018 -----------------------------------------  The patient's BMP did result with a blood sugar of greater than 1000.  His anion gap is 14, which is borderline for DKA but I will start him on an insulin drip at this time.  He also has hyponatremia and hyperkalemia, as well as acute renal insufficiency with a creatinine of 2.8.  He is receiving intravenous fluids and has received calcium gluconate upon arrival.  I have spoken to the hospitalist for admission at this time.  CRITICAL CARE Performed by: Eula Listen   Total critical care time: 45 minutes  Critical care time was exclusive of separately billable procedures and treating other patients.  Critical care was necessary to treat or prevent imminent or life-threatening deterioration.  Critical care was time spent personally by me on the following activities: development of treatment plan with patient and/or surrogate as well as nursing, discussions with consultants, evaluation of patient's response to treatment, examination of patient, obtaining history from patient or surrogate, ordering and performing treatments and interventions, ordering and review of laboratory studies, ordering and review of radiographic studies, pulse oximetry and re-evaluation of patient's condition.   ____________________________________________  FINAL CLINICAL IMPRESSION(S) / ED DIAGNOSES  Final diagnoses:   NSTEMI (non-ST elevated myocardial infarction) ([place])  Diabetic ketoacidosis without coma associated with other specified diabetes mellitus ([place])  Acute renal insufficiency  Hyponatremia  Hyperkalemia         NEW MEDICATIONS STARTED DURING THIS VISIT:  New Prescriptions   No medications on file      Eula Listen, MD 02/12/18 [id number]    Eula Listen, MD 02/12/18 [id number]

## 2018-02-12 NOTE — Progress Notes (Signed)
Inpatient Diabetes Program Recommendations  AACE/ADA: New Consensus Statement on Inpatient Glycemic Control (2015)  Target Ranges:  Prepandial:   less than 140 mg/dL      Peak postprandial:   less than 180 mg/dL (1-2 hours)      Critically ill patients:  140 - 180 mg/dL   Results for Anthony Sellers, Anthony SR. (MRN [id number]) as of 02/12/2018 08:54  Ref. Range 02/12/2018 07:22  Glucose-Capillary Latest Ref Range: 70 - 99 mg/dL >[facility])   Results for Anthony Sellers, Anthony SR. (MRN [id number]) as of 02/12/2018 08:54  Ref. Range 02/11/2018 17:00  Hemoglobin A1C Latest Ref Range: 4.8 - 5.6 % 10.4 (H)  (252 mg/dl)     Admit with: Seen at PCP office ([place]) 01/20--Labs drawn showed glucose of 975 mg/dl--Patient was also having CP--Was instructed to go to the ED for treatment  History: DM, CKD, HTN  Home DM Meds: None--Diet Controlled  Current Orders: None yet     Recent Gout Flare--Was given 12 day Prednisone taper.  Given IVF in the ED and 10 units Novolog at 8am.  Current A1c of 10.4% reflective of poor glucose control at home--Could be partially due to the recent Prednisone he took for the gout flare.   Addendum 2pm- Met with pt today.  Pt told me he developed symptoms of high blood sugars (thirsty and frequent urination) several days after he finished taking his Prednisone for his gout.  Wife urged him to go to his PCP and PCP sent him to the ED for treatment given his A1c was 10.4%.  Pt told me he was previously "pre-diabetic" but that he feels the Prednisone is what caused him to develop diabetes.  Spoke with pt about new diagnosis.  Discussed A1C results with him and explained what an A1C is, basic pathophysiology of DM Type 2, basic home care, basic diabetes diet nutrition principles, importance of checking CBGs and maintaining good CBG control to prevent long-term and short-term complications.  Reviewed signs and symptoms of hyperglycemia and hypoglycemia and how to  treat hypoglycemia at home.  Also reviewed blood sugar goals and A1c goals for home.    RNs to provide ongoing basic DM education at bedside with this patient.  Have ordered educational booklet and insulin teaching just in case pt needs insulin for home.  Have also placed RD consult for DM diet education for this patient and also placed consult for OP DM edu to the [facility] at [place].  Patient very open and receptive to all teaching.  Expect good compliance with meds and diet.  Strongly encouraged pt to share the DM booklet with his family and to make sure he follows up with his PCP after d/c.  Pt told me he has an appt with PCP office on 02/27/2018.     --Will follow patient during hospitalization--  Wyn Quaker RN, MSN, CDE Diabetes Coordinator Inpatient Glycemic Control Team Team Pager: [phone] (8a-5p)

## 2018-02-12 NOTE — Progress Notes (Signed)
Spoke with Darlyn Chamber, NP and made him aware that patient had another run of SVT that was not sustained.  NP ordering troponin and add on Mag.  Denies pain and asymptomatic.

## 2018-02-12 NOTE — ED Notes (Signed)
Report to Jeanette,RN

## 2018-02-12 NOTE — H&P (Signed)
[facility] NAME: Anthony Sellers    MR#:  [id number]  DATE OF BIRTH:  1960/03/20  DATE OF ADMISSION:  02/12/2018  PRIMARY CARE PHYSICIAN: Valerie Roys, DO   REQUESTING/REFERRING PHYSICIAN:  Dr Mariea Clonts  CHIEF COMPLAINT:   Chief Complaint  Patient presents with  . Chest Pain  . Hyperglycemia  Increased frequency of urination and increased thirst and drinking excess water  HISTORY OF PRESENT ILLNESS:  Anthony Sellers  is a 58 y.o. male with a known history of hypertension, borderline diabetes mellitus, chronic kidney disease stage III and hyperlipidemia who was treated recently with 12-day course of prednisone taper for gout flareup who presented to the emergency room with complaints of increased frequency of urination and polyuria.  Patient also reported having some intermittent chest pain.  Denied any nausea or vomiting.  No abdominal pains.  No diarrhea.  Patient saw his primary care physician yesterday who checked blood work and patient was called this morning that his blood sugar was greater than 900 and advised to go to the hospital to be checked out.  Patient denied any fevers.  Work-up done in the emergency room significant for hyperglycemia with blood sugar of 1177 with anion gap mildly elevated at 14.  Troponin level was elevated at 0.77.  Emergency room provider discussed case with cardiologist on-call Dr. Saunders Revel who recommended admitting patient and managing the hyperglycemia and he will see patient in consultation.  Patient currently denies any chest pain.  Already started on insulin drip and heparin drip in the emergency room.  Patient being admitted to the ICU for further evaluation and management.  PAST MEDICAL HISTORY:   Past Medical History:  Diagnosis Date  . Diabetes mellitus without complication ([employer])   . Enlarged prostate   . Gout   . Hyperlipidemia   . Hypertension   . IFG (impaired fasting glucose)   . Vitamin D  deficiency     PAST SURGICAL HISTORY:  History reviewed. No pertinent surgical history.  SOCIAL HISTORY:   Social History   Tobacco Use  . Smoking status: Former Smoker    Packs/day: 0.25    Types: Cigarettes  . Smokeless tobacco: Never Used  Substance Use Topics  . Alcohol use: Yes    Comment: on occasion    FAMILY HISTORY:   Family History  Problem Relation Age of Onset  . Hypertension Mother   . Alzheimer's disease Father   . Hypertension Brother   . Cancer Maternal Grandfather        Prostate  . Cancer Sister        Breast Cancer    DRUG ALLERGIES:  No Known Allergies  REVIEW OF SYSTEMS:   Review of Systems  Constitutional: Negative for chills and fever.  HENT: Negative for hearing loss and tinnitus.   Eyes: Negative for blurred vision and double vision.  Respiratory: Negative for cough, hemoptysis and shortness of breath.   Cardiovascular: Negative for chest pain, palpitations and leg swelling.       Had some chest pain prior to admission.  Currently chest pain-free  Gastrointestinal: Negative for abdominal pain, heartburn, nausea and vomiting.  Genitourinary: Positive for frequency. Negative for dysuria and hematuria.  Musculoskeletal: Negative for myalgias and neck pain.  Skin: Negative for itching and rash.  Neurological: Negative for dizziness and headaches.  Psychiatric/Behavioral: Negative for depression and hallucinations.      MEDICATIONS AT HOME:   Prior to Admission medications  Medication Sig Start Date End Date Taking? Authorizing Provider  allopurinol (ZYLOPRIM) 100 MG tablet Take 0.5 tablets (50 mg total) by mouth daily. 01/29/18  Yes Cannady, Jolene T, NP  chlorthalidone (HYGROTON) 25 MG tablet Take 1 tablet (25 mg total) by mouth daily. 01/21/18  Yes Cannady, Jolene T, NP  hydrALAZINE (APRESOLINE) 100 MG tablet Take 1 tablet (100 mg total) by mouth 2 (two) times daily. 11/02/17  Yes Johnson, [name], DO  lisinopril (PRINIVIL,ZESTRIL) 40  MG tablet Take 1 tablet (40 mg total) by mouth daily. 11/02/17  Yes Johnson, [name], DO  spironolactone (ALDACTONE) 25 MG tablet Take 1 tablet (25 mg total) by mouth 2 (two) times daily. 11/02/17  Yes Johnson, [name], DO      VITAL SIGNS:  Blood pressure (!) 159/120, pulse 91, temperature 97.6 F (36.4 C), temperature source Oral, resp. rate 12, height 5\' 6"  (1.676 m), weight 93.9 kg, SpO2 99 %.  PHYSICAL EXAMINATION:  Physical Exam  GENERAL:  58 y.o.-year-old patient lying in the bed with no acute distress.  EYES: Pupils equal, round, reactive to light and accommodation. No scleral icterus. Extraocular muscles intact.  HEENT: Head atraumatic, normocephalic.  Dry oral mucosa NECK:  Supple, no jugular venous distention. No thyroid enlargement, no tenderness.  LUNGS: Normal breath sounds bilaterally, no wheezing, rales,rhonchi or crepitation. No use of accessory muscles of respiration.  CARDIOVASCULAR: S1, S2 normal. No murmurs, rubs, or gallops.  ABDOMEN: Soft, nontender, nondistended. Bowel sounds present. No organomegaly or mass.  EXTREMITIES: No pedal edema, cyanosis, or clubbing.  NEUROLOGIC: Cranial nerves II through XII are intact. Muscle strength 5/5 in all extremities. Sensation intact. Gait not checked.  PSYCHIATRIC: The patient is alert and oriented x 3.  SKIN: Dry.  No obvious rash, lesion, or ulcer.   LABORATORY PANEL:   CBC Recent Labs  Lab 02/12/18 0739  WBC 14.9*  HGB 14.5  HCT 46.8  PLT 238   ------------------------------------------------------------------------------------------------------------------  Chemistries  Recent Labs  Lab 02/12/18 0739  NA 122*  K 5.8*  CL 87*  CO2 21*  GLUCOSE 1,177*  BUN 65*  CREATININE 2.88*  CALCIUM 9.7  AST 23  ALT 23  ALKPHOS 79  BILITOT 1.7*   ------------------------------------------------------------------------------------------------------------------  Cardiac Enzymes Recent Labs  Lab 02/12/18 0739    TROPONINI 0.77*   ------------------------------------------------------------------------------------------------------------------  RADIOLOGY:  Dg Chest 2 View  Result Date: 02/12/2018 CLINICAL DATA:  Chest pain EXAM: CHEST - 2 VIEW COMPARISON:  11/03/2016 FINDINGS: Low lung volumes with minor central bronchitic change. No focal pneumonia, collapse or consolidation. Negative for edema, effusion or pneumothorax. Trachea is midline. Normal heart size and vascularity. IMPRESSION: Low volume exam with minor central bronchitic change. No focal pneumonia. Electronically Signed   By: Jerilynn Mages.  Shick M.D.   On: 02/12/2018 08:24      IMPRESSION AND PLAN:   Patient is a 58 year old African-American male with history of borderline diabetes mellitus, hypertension and DKA who was recently treated for gout flareup with 12-day course of prednisone taper now being admitted for management of DKA with blood sugar greater than 1000 and elevated troponin of 0.77.  1.  Diabetic ketoacidosis Started on insulin drip and IV fluids following the DKA protocol. Being admitted to ICU.  Requested for serial BMP every 4 hours to monitor anion gap and transition off insulin drip once anion gap is corrected. Patient was not on any diabetic regimen prior to admission.  Claims he was borderline diabetic.  Elevated blood sugar likely triggered by  recent steroid therapy. Glycosylated hemoglobin level in a.m. Monitor electrolytes and correct as needed. I have discussed case with intensivist on-call Dr. Alva Garnet who will continue management of patient while patient is in the ICU.  2.  Mildly elevated troponin. Patient admitted to having some intermittent chest pain.  Currently chest pain-free. Concern for possible non-ST MI elevation.  Patient already started on heparin drip. Emergency room provider Dr. Mariea Clonts already discussed case with cardiologist on-call Dr. Saunders Revel who will see patient in consultation and make decision regarding  cardiac catheterization after DKA is resolved. Placed on aspirin.  Statins.  Lipid panel in a.m.  2D echocardiogram to evaluate cardiac function Requested for urine drug screen to ensure no cocaine use prior to adding beta-blockers.  3.  Hyponatremia with sodium of 122 This is secondary to pseudohyponatremia from hyperglycemia with blood sugar greater than 1000. Expect correction of sodium level with management of hyperglycemia  4.  Mild hyperkalemia with potassium of 5.8 Expect resolution with ongoing insulin drip.  Monitor.  5.  Acute kidney injury Superimposed on chronic kidney disease stage III Already placed on IV fluids.  Likely secondary to dehydration Requested for nephrology consultation.  Holding off on home dose of spironolactone and ACE inhibitor as well as chlorthalidone. Follow-up on BMP in a.m.  DVT prophylaxis; patient on heparin drip already  CODE STATUS; full code   All the records are reviewed and case discussed with ED provider. Management plans discussed with the patient, and he is in agreement.   TOTAL TIME TAKING CARE OF THIS PATIENT: 65 minutes.    Saniyah Mondesir M.D on 02/12/2018 at 11:07 AM  Between 7am to 6pm - Pager - [phone]  After 6pm go to [email or web address] - Proofreader  [facility]  Office  [phone]  CC: Primary care physician; Valerie Roys, DO   Note: This dictation was prepared with Dragon dictation along with smaller phrase technology. Any transcriptional errors that result from this process are unintentional.

## 2018-02-12 NOTE — Progress Notes (Signed)
[facility]  ROUNDING NOTE   Subjective:   Mr. ABDULKAREEM BADOLATO Sr. admitted to [facility] on 02/12/2018 for Hyperkalemia [E87.5] Hyponatremia [E87.1] Acute renal insufficiency [N28.9] NSTEMI (non-ST elevated myocardial infarction) ([place]) [I21.4] Diabetic ketoacidosis without coma associated with other specified diabetes mellitus ([place]) [E13.10] DKA (diabetic ketoacidoses) ([place]) [E11.10]  Patient with wife and son at bedside. Patient was being treated for an acute flare of gout with prednisone, right first toe. States he was having chest pains. Brought to the ED where he was found to have acute renal failure, elevated cardiac enzymes, and diabetic ketoacidosis.  Objective:  Vital signs in last 24 hours:  Temp:  [97.6 F (36.4 C)-98.1 F (36.7 C)] 98.1 F (36.7 C) (01/21 1218) Pulse Rate:  [86-120] 86 (01/21 1218) Resp:  [12-20] 15 (01/21 1218) BP: (138-159)/(81-120) 151/104 (01/21 1218) SpO2:  [95 %-99 %] 97 % (01/21 1218) Weight:  [93.4 kg-93.9 kg] 93.9 kg (01/21 0732)  Weight change:  Filed Weights   02/12/18 0722 02/12/18 0732  Weight: 93.4 kg 93.9 kg    Intake/Output: No intake/output data recorded.   Intake/Output this shift:  No intake/output data recorded.  Physical Exam: General: NAD,   Head: Normocephalic, atraumatic. Moist oral mucosal membranes  Eyes: Anicteric, PERRL  Neck: Supple, trachea midline  Lungs:  Clear to auscultation  Heart: Regular rate and rhythm  Abdomen:  Soft, nontender,   Extremities:  no peripheral edema.  Neurologic: Nonfocal, moving all four extremities  Skin: No lesions        Basic Metabolic Panel: Recent Labs  Lab 02/11/18 1700 02/12/18 0739 02/12/18 1039  NA 125* 122* 132*  K 5.7* 5.8* 5.7*  CL 83* 87* 98  CO2 21 21* 23  GLUCOSE 975* 1,177* 777*  BUN 48* 65* 61*  CREATININE 2.74* 2.88* 2.63*  CALCIUM 9.9 9.7 9.9  MG  --   --  2.4  PHOS  --   --  4.2    Liver Function Tests: Recent Labs  Lab 02/11/18 1700  02/12/18 0739  AST 13 23  ALT 18 23  ALKPHOS 92 79  BILITOT 1.0 1.7*  PROT 7.3 7.5  ALBUMIN 4.5 4.1   No results for input(s): LIPASE, AMYLASE in the last 168 hours. No results for input(s): AMMONIA in the last 168 hours.  CBC: Recent Labs  Lab 02/11/18 1700 02/12/18 0739  WBC 13.3* 14.9*  NEUTROABS 11.1*  --   HGB 14.6 14.5  HCT 46.0 46.8  MCV 89 88.1  PLT 244 238    Cardiac Enzymes: Recent Labs  Lab 02/12/18 0739 02/12/18 1039  TROPONINI 0.77* 1.64*    BNP: Invalid input(s): POCBNP  CBG: Recent Labs  Lab 02/12/18 0722 02/12/18 1042 02/12/18 1203 02/12/18 1229  GLUCAP >600* >600* >600* 558*    Microbiology: Results for orders placed or performed in visit on 07/04/16  Microscopic Examination     Status: None   Collection Time: 07/04/16  2:19 PM  Result Value Ref Range Status   WBC, UA 0-5 0 - 5 /hpf Final   RBC, UA 0-2 0 - 2 /hpf Final   Epithelial Cells (non renal) 0-10 0 - 10 /hpf Final   Bacteria, UA None seen None seen/Few Final    Coagulation Studies: Recent Labs    02/12/18 0739  LABPROT 14.4  INR 1.13    Urinalysis: Recent Labs    02/12/18 0749  COLORURINE STRAW*  LABSPEC 1.022  PHURINE 5.0  GLUCOSEU >=500*  HGBUR SMALL*  BILIRUBINUR NEGATIVE  KETONESUR NEGATIVE  PROTEINUR NEGATIVE  NITRITE NEGATIVE  LEUKOCYTESUR NEGATIVE      Imaging: Dg Chest 2 View  Result Date: 02/12/2018 CLINICAL DATA:  Chest pain EXAM: CHEST - 2 VIEW COMPARISON:  11/03/2016 FINDINGS: Low lung volumes with minor central bronchitic change. No focal pneumonia, collapse or consolidation. Negative for edema, effusion or pneumothorax. Trachea is midline. Normal heart size and vascularity. IMPRESSION: Low volume exam with minor central bronchitic change. No focal pneumonia. Electronically Signed   By: Jerilynn Mages.  Shick M.D.   On: 02/12/2018 08:24     Medications:   . sodium chloride 1,000 mL (02/12/18 1002)  . sodium chloride    . dextrose 5 % and 0.45% NaCl     . dextrose 5 % and 0.45% NaCl    . heparin 1,150 Units/hr (02/12/18 0946)  . insulin     . [START ON 02/13/2018] aspirin EC  325 mg Oral Daily  . atorvastatin  40 mg Oral q1800  . hydrALAZINE  100 mg Oral BID  . insulin regular  0-10 Units Intravenous TID WC   dextrose  Assessment/ Plan:  Mr. JEDRICK HUTCHERSON Sr. is a 58 y.o. black male hypertension, hypertensive retinopathy, BPH, obstructive sleep apnea, diet controlled diabetes, gout, who is admitted to [facility] on 02/12/2018   1. Acute renal failure on chronic kidney disease stage III: baseline creatinine of 1.79, GFR of 48. Last seen by nephrology in 05/2016, Dr. Holley Raring.  2. Hyperkalemia 3. Hyponatremia 4. Metabolic acidosis: anion gap of 14. Consistent with diabetic ketoacidosis 5. Diabetes mellitus type II with chronic kidney disease: no history of proteinuria. Hemoglobin A1c of 10.4%.   Plan Start DKA protocol.  Reviewed chronic kidney disease with family    LOS: 0 Jaquana Geiger 1/21/202012:43 PM

## 2018-02-12 NOTE — ED Notes (Signed)
Admitting MD allowing patient to drink water. Pt given water.

## 2018-02-12 NOTE — ED Notes (Signed)
ED Provider at bedside. 

## 2018-02-12 NOTE — ED Notes (Signed)
First Nurse Note: Katherine Mantle from [facility] called this AM to report that patient was enroute.  Labs drawn at office - Glucose 975, low sodium and Potassium of 5.7 found and patient was notified to come to ED.

## 2018-02-12 NOTE — ED Notes (Signed)
Report to Anna, RN

## 2018-02-12 NOTE — Telephone Encounter (Signed)
Spoke on phone this morning to pt wife with instruction to take into ER this morning due to lab values and possible need for admission.  She reported she would "get him up right now" and take him into ER as advised by provider.

## 2018-02-12 NOTE — ED Notes (Signed)
Troponin 0.79 verbal in person to Dr. Mariea Clonts

## 2018-02-12 NOTE — ED Notes (Signed)
ED TO INPATIENT HANDOFF REPORT  Name/Age/Gender Anthony Feil Sr. 58 y.o. male  Code Status    Code Status Orders  (From admission, onward)         Start     Ordered   02/12/18 1019  Full code  Continuous     02/12/18 1026        Code Status History    This patient has a current code status but no historical code status.      Home/SNF/Other home Chief Complaint chest pain  Level of Care/Admitting Diagnosis ED Disposition    ED Disposition Condition [place]: [facility] [[id number]]  Level of Care: ICU [6]  Diagnosis: DKA (diabetic ketoacidoses) [facility]) [094709]  Admitting Physician: Otila Back [place]  Attending Physician: Otila Back [[id number]]  Estimated length of stay: past midnight tomorrow  Certification:: I certify this patient will need inpatient services for at least 2 midnights  PT Class (Do Not Modify): Inpatient [101]  PT Acc Code (Do Not Modify): Private [1]       Medical History Past Medical History:  Diagnosis Date  . Diabetes mellitus without complication ([place])   . Enlarged prostate   . Gout   . Hyperlipidemia   . Hypertension   . IFG (impaired fasting glucose)   . Vitamin D deficiency     Allergies No Known Allergies  IV Location/Drains/Wounds Patient Lines/Drains/Airways Status   Active Line/Drains/Airways    Name:   Placement date:   Placement time:   Site:   Days:   Peripheral IV 02/12/18 Right Antecubital   02/12/18    0758    Antecubital   less than 1   Peripheral IV 02/12/18 Left Antecubital   02/12/18    0903    Antecubital   less than 1   Peripheral IV 02/12/18 Left Hand   02/12/18    1009    Hand   less than 1          Labs/Imaging Results for orders placed or performed during the hospital encounter of 02/12/18 (from the past 48 hour(s))  Glucose, capillary     Status: Abnormal   Collection Time: 02/12/18  7:22 AM  Result Value Ref Range   Glucose-Capillary >600 (HH) 70 - 99 mg/dL   Basic metabolic panel     Status: Abnormal   Collection Time: 02/12/18  7:39 AM  Result Value Ref Range   Sodium 122 (L) 135 - 145 mmol/L   Potassium 5.8 (H) 3.5 - 5.1 mmol/L   Chloride 87 (L) 98 - 111 mmol/L   CO2 21 (L) 22 - 32 mmol/L   Glucose, Bld 1,177 (HH) 70 - 99 mg/dL    Comment: CRITICAL RESULT CALLED TO, READ BACK BY AND VERIFIED WITH: ALLY Thora Scherman @ 0919 02/12/18 SMA/HKP    BUN 65 (H) 6 - 20 mg/dL   Creatinine, Ser 2.88 (H) 0.61 - 1.24 mg/dL   Calcium 9.7 8.9 - 10.3 mg/dL   GFR calc non Af Amer 23 (L) >60 mL/min   GFR calc Af Amer 27 (L) >60 mL/min   Anion gap 14 5 - 15    Comment: Performed at [facility], [address] [place], [place] [address]  CBC     Status: Abnormal   Collection Time: 02/12/18  7:39 AM  Result Value Ref Range   WBC 14.9 (H) 4.0 - 10.5 K/uL   RBC 5.31 4.22 - 5.81 MIL/uL   Hemoglobin  14.5 13.0 - 17.0 g/dL   HCT 46.8 39.0 - 52.0 %   MCV 88.1 80.0 - 100.0 fL   MCH 27.3 26.0 - 34.0 pg   MCHC 31.0 30.0 - 36.0 g/dL   RDW 13.6 11.5 - 15.5 %   Platelets 238 150 - 400 K/uL   nRBC 0.0 0.0 - 0.2 %    Comment: Performed at [facility], [address] [place], [place] [address]  Troponin I - ONCE - STAT     Status: Abnormal   Collection Time: 02/12/18  7:39 AM  Result Value Ref Range   Troponin I 0.77 (HH) <0.03 ng/mL    Comment: CRITICAL VALUE NOTED. VALUE IS CONSISTENT WITH PREVIOUSLY REPORTED/CALLED VALUE/SMA/HKP Performed at [facility], [address] [place], [place] [address]   Hepatic function panel     Status: Abnormal   Collection Time: 02/12/18  7:39 AM  Result Value Ref Range   Total Protein 7.5 6.5 - 8.1 g/dL   Albumin 4.1 3.5 - 5.0 g/dL   AST 23 15 - 41 U/L   ALT 23 0 - 44 U/L   Alkaline Phosphatase 79 38 - 126 U/L   Total Bilirubin 1.7 (H) 0.3 - 1.2 mg/dL   Bilirubin, Direct 0.2 0.0 - 0.2 mg/dL   Indirect Bilirubin 1.5 (H) 0.3 - 0.9 mg/dL    Comment: Performed at [facility], [address] [place], [place] [address]  Protime-INR     Status: None   Collection Time: 02/12/18  7:39 AM  Result Value Ref Range   Prothrombin Time 14.4 11.4 - 15.2 seconds   INR 1.13     Comment: Performed at [facility], [address] [place], [place] [address]  APTT     Status: None   Collection Time: 02/12/18  7:39 AM  Result Value Ref Range   aPTT 28 24 - 36 seconds    Comment: Performed at [facility], [address] [place], [place] [address]  Blood gas, venous     Status: Abnormal   Collection Time: 02/12/18  7:49 AM  Result Value Ref Range   pH, Ven 7.38 7.250 - 7.430   pCO2, Ven 38 (L) 44.0 - 60.0 mmHg   pO2, Ven 73.0 (H) 32.0 - 45.0 mmHg   Bicarbonate 22.5 20.0 - 28.0 mmol/L   Acid-base deficit 2.3 (H) 0.0 - 2.0 mmol/L   O2 Saturation 94.2 %   Patient temperature 37.0    Collection site VEIN    Sample type VEIN     Comment: Performed at [facility], [address] [place], [place] [address]  Urinalysis, Complete w Microscopic     Status: Abnormal   Collection Time: 02/12/18  7:49 AM  Result Value Ref Range   Color, Urine STRAW (A) YELLOW   APPearance CLEAR (A) CLEAR   Specific Gravity, Urine 1.022 1.005 - 1.030   pH 5.0 5.0 - 8.0   Glucose, UA >=500 (A) NEGATIVE mg/dL   Hgb urine dipstick SMALL (A) NEGATIVE   Bilirubin Urine NEGATIVE NEGATIVE   Ketones, ur NEGATIVE NEGATIVE mg/dL   Protein, ur NEGATIVE NEGATIVE mg/dL   Nitrite NEGATIVE NEGATIVE   Leukocytes, UA NEGATIVE NEGATIVE   RBC / HPF 0-5 0 - 5 RBC/hpf   WBC, UA 0-5 0 - 5 WBC/hpf   Bacteria, UA NONE SEEN NONE SEEN   Squamous Epithelial / LPF NONE SEEN 0 - 5    Comment: Performed at [facility], [place], [place] [address]  Glucose, capillary     Status: Abnormal   Collection Time: 02/12/18 10:42 AM  Result Value Ref Range   Glucose-Capillary >600 (HH) 70 - 99 mg/dL   Dg Chest 2 View  Result Date: 02/12/2018 CLINICAL DATA:  Chest pain  EXAM: CHEST - 2 VIEW COMPARISON:  11/03/2016 FINDINGS: Low lung volumes with minor central bronchitic change. No focal pneumonia, collapse or consolidation. Negative for edema, effusion or pneumothorax. Trachea is midline. Normal heart size and vascularity. IMPRESSION: Low volume exam with minor central bronchitic change. No focal pneumonia. Electronically Signed   By: Jerilynn Mages.  Shick M.D.   On: 02/12/2018 08:24    Pending Labs Unresulted Labs (From admission, onward)    Start     Ordered   02/13/18 0500  Hemoglobin A1c  Tomorrow morning,   STAT     02/12/18 1034   02/13/18 0500  Lipid panel  Tomorrow morning,   STAT     02/12/18 1036   02/13/18 6333  Basic metabolic panel  Tomorrow morning,   STAT     02/12/18 1036   02/13/18 0500  CBC  Tomorrow morning,   STAT     02/12/18 1036   02/13/18 0500  Magnesium  Tomorrow morning,   STAT     02/12/18 1036   02/13/18 0500  Phosphorus  Tomorrow morning,   STAT     02/12/18 1036   02/12/18 5456  Basic metabolic panel  STAT Now then every 4 hours ,   STAT     02/12/18 1026   02/12/18 1100  Magnesium  Once,   STAT     02/12/18 1026   02/12/18 1100  Phosphorus  Once,   STAT     02/12/18 1026   02/12/18 1100  Troponin I - Now Then Q6H  Now then every 6 hours,   STAT     02/12/18 1034   02/12/18 1059  Urine Drug Screen, Qualitative ([facility] only)  Add-on,   AD     02/12/18 1058          Vitals/Pain Today's Vitals   02/12/18 0731 02/12/18 0732 02/12/18 0937 02/12/18 1028  BP: (!) 151/108  (!) 159/120   Pulse: (!) 111  91   Resp: 20  12   Temp: 97.6 F (36.4 C)     TempSrc: Oral     SpO2: 95%  99%   Weight:  93.9 kg    Height:  5\' 6"  (1.676 m)    PainSc:    0-No pain    Isolation Precautions No active isolations  Medications Medications  heparin ADULT infusion 100 units/mL (25000 units/259mL sodium chloride 0.45%) (1,150 Units/hr Intravenous New Bag/Given 02/12/18 0946)  dextrose 5 %-0.45 % sodium chloride infusion (has no administration  in time range)  insulin regular bolus via infusion 0-10 Units (has no administration in time range)  dextrose 50 % solution 25 mL (has no administration in time range)  0.9 %  sodium chloride infusion (1,000 mLs Intravenous New Bag/Given 02/12/18 1002)  hydrALAZINE (APRESOLINE) tablet 100 mg (has no administration in time range)  0.9 %  sodium chloride infusion (has no administration in time range)  dextrose 5 %-0.45 % sodium chloride infusion (has no administration in time range)  insulin regular, human (MYXREDLIN) 100 units/ 100 mL infusion (has no administration in time range)  aspirin EC tablet 325 mg (has no administration in time range)  atorvastatin (LIPITOR) tablet 40 mg (  has no administration in time range)  sodium chloride flush (NS) 0.9 % injection 3 mL (3 mLs Intravenous Given 02/12/18 0818)  sodium chloride 0.9 % bolus 1,000 mL (0 mLs Intravenous Stopped 02/12/18 0937)  insulin aspart (novoLOG) injection 10 Units (10 Units Intravenous Given 02/12/18 0818)  calcium gluconate 1 g/ 50 mL sodium chloride IVPB (0 g Intravenous Stopped 02/12/18 0957)  sodium chloride 0.9 % bolus 1,000 mL (0 mLs Intravenous Stopped 02/12/18 1002)  aspirin chewable tablet 324 mg (324 mg Oral Given 02/12/18 0901)  heparin injection 4,000 Units (4,000 Units Intravenous Given 02/12/18 0941)    Mobility independent

## 2018-02-12 NOTE — ED Triage Notes (Signed)
Seen at pcp yesterday and they called this am to tell him glu was 975.  He also has chest pain this am.

## 2018-02-12 NOTE — Progress Notes (Signed)
ANTICOAGULATION CONSULT NOTE - Initial Consult  Pharmacy Consult for Heparin Indication: chest pain/ACS  No Known Allergies  Patient Measurements: Height: 5\' 6"  (167.6 cm) Weight: 207 lb (93.9 kg) IBW/kg (Calculated) : 63.8 Heparin Dosing Weight: 84 kg  Vital Signs: Temp: 97.6 F (36.4 C) (01/21 0731) Temp Source: Oral (01/21 0731) BP: 142/87 (01/21 1112) Pulse Rate: 90 (01/21 1112)  Labs: Recent Labs    02/11/18 1700 02/12/18 0739 02/12/18 1039  HGB 14.6 14.5  --   HCT 46.0 46.8  --   PLT 244 238  --   APTT  --  28  --   LABPROT  --  14.4  --   INR  --  1.13  --   CREATININE 2.74* 2.88* 2.63*  TROPONINI  --  0.77* 1.64*    Estimated Creatinine Clearance: 33.6 mL/min (A) (by C-G formula based on SCr of 2.63 mg/dL (H)).   Medical History: Past Medical History:  Diagnosis Date  . Diabetes mellitus without complication ([place])   . Enlarged prostate   . Gout   . Hyperlipidemia   . Hypertension   . IFG (impaired fasting glucose)   . Vitamin D deficiency     Assessment: Patient is a 58yo male admitted with possible NSTEMI. Patient was started on Heparin drip by ED MD with Heparin 4000 units IV bolus and 1150 units/hr drip.  Goal of Therapy:  Heparin level 0.3-0.7 units/ml Monitor platelets by anticoagulation protocol: Yes   Plan:  Will order a Heparin level for 6 hours after start of infusion.  Paulina Fusi, PharmD, BCPS 02/12/2018 12:15 PM

## 2018-02-12 NOTE — Progress Notes (Signed)
*  PRELIMINARY RESULTS* Echocardiogram 2D Echocardiogram has been performed.  Sherrie Sport 02/12/2018, 2:07 PM

## 2018-02-12 NOTE — Progress Notes (Signed)
Patient admitted to ICU 2. Very surprised he has to stay. Stated he has had no chest pain. Remains in SR and on room air. Family in with patient.

## 2018-02-12 NOTE — ED Notes (Signed)
Verbal in person critical glucose of 1177 to Dr. Mariea Clonts.

## 2018-02-13 DIAGNOSIS — N183 Chronic kidney disease, stage 3 (moderate): Secondary | ICD-10-CM

## 2018-02-13 DIAGNOSIS — E1110 Type 2 diabetes mellitus with ketoacidosis without coma: Principal | ICD-10-CM

## 2018-02-13 DIAGNOSIS — R7989 Other specified abnormal findings of blood chemistry: Secondary | ICD-10-CM

## 2018-02-13 DIAGNOSIS — N179 Acute kidney failure, unspecified: Secondary | ICD-10-CM

## 2018-02-13 DIAGNOSIS — E782 Mixed hyperlipidemia: Secondary | ICD-10-CM

## 2018-02-13 DIAGNOSIS — I214 Non-ST elevation (NSTEMI) myocardial infarction: Secondary | ICD-10-CM

## 2018-02-13 DIAGNOSIS — N171 Acute kidney failure with acute cortical necrosis: Secondary | ICD-10-CM

## 2018-02-13 LAB — GLUCOSE, CAPILLARY
Glucose-Capillary: 169 mg/dL — ABNORMAL HIGH (ref 70–99)
Glucose-Capillary: 171 mg/dL — ABNORMAL HIGH (ref 70–99)
Glucose-Capillary: 208 mg/dL — ABNORMAL HIGH (ref 70–99)
Glucose-Capillary: 232 mg/dL — ABNORMAL HIGH (ref 70–99)
Glucose-Capillary: 279 mg/dL — ABNORMAL HIGH (ref 70–99)
Glucose-Capillary: 301 mg/dL — ABNORMAL HIGH (ref 70–99)
Glucose-Capillary: 332 mg/dL — ABNORMAL HIGH (ref 70–99)

## 2018-02-13 LAB — MAGNESIUM: Magnesium: 1.9 mg/dL (ref 1.7–2.4)

## 2018-02-13 LAB — LIPID PANEL
Cholesterol: 263 mg/dL — ABNORMAL HIGH (ref 0–200)
HDL: 42 mg/dL (ref >40)
LDL Cholesterol: 181 mg/dL — ABNORMAL HIGH (ref 0–99)
Total CHOL/HDL Ratio: 6.3 RATIO
Triglycerides: 199 mg/dL — ABNORMAL HIGH (ref <150)
VLDL: 40 mg/dL (ref 0–40)

## 2018-02-13 LAB — TROPONIN I
Troponin I: 0.74 ng/mL (ref <0.03)
Troponin I: 0.58 ng/mL (ref <0.03)

## 2018-02-13 LAB — BASIC METABOLIC PANEL
ANION GAP: 6 (ref 5–15)
BUN: 50 mg/dL — ABNORMAL HIGH (ref 6–20)
CO2: 26 mmol/L (ref 22–32)
Calcium: 8.7 mg/dL — ABNORMAL LOW (ref 8.9–10.3)
Chloride: 106 mmol/L (ref 98–111)
Creatinine, Ser: 2.41 mg/dL — ABNORMAL HIGH (ref 0.61–1.24)
GFR calc Af Amer: 34 mL/min — ABNORMAL LOW (ref >60)
GFR calc non Af Amer: 29 mL/min — ABNORMAL LOW (ref >60)
Glucose, Bld: 221 mg/dL — ABNORMAL HIGH (ref 70–99)
POTASSIUM: 4.7 mmol/L (ref 3.5–5.1)
Sodium: 138 mmol/L (ref 135–145)

## 2018-02-13 LAB — HEPARIN LEVEL (UNFRACTIONATED)
Heparin Unfractionated: 0.88 IU/mL — ABNORMAL HIGH (ref 0.30–0.70)
Heparin Unfractionated: 0.59 IU/mL (ref 0.30–0.70)
Heparin Unfractionated: 0.54 IU/mL (ref 0.30–0.70)

## 2018-02-13 LAB — CBC
HEMATOCRIT: 40.1 % (ref 39.0–52.0)
HEMOGLOBIN: 13.1 g/dL (ref 13.0–17.0)
MCH: 27.5 pg (ref 26.0–34.0)
MCHC: 32.7 g/dL (ref 30.0–36.0)
MCV: 84.1 fL (ref 80.0–100.0)
Platelets: 214 10*3/uL (ref 150–400)
RBC: 4.77 MIL/uL (ref 4.22–5.81)
RDW: 13.2 % (ref 11.5–15.5)
WBC: 13.8 10*3/uL — ABNORMAL HIGH (ref 4.0–10.5)
nRBC: 0 % (ref 0.0–0.2)

## 2018-02-13 LAB — PHOSPHORUS: Phosphorus: 2.1 mg/dL — ABNORMAL LOW (ref 2.5–4.6)

## 2018-02-13 LAB — HEMOGLOBIN A1C
Hgb A1c MFr Bld: 10.8 % — ABNORMAL HIGH (ref 4.8–5.6)
MEAN PLASMA GLUCOSE: 263.26 mg/dL

## 2018-02-13 MED ORDER — INSULIN ASPART 100 UNIT/ML ~~LOC~~ SOLN: 1.0000 [IU] | SUBCUTANEOUS | Status: DC

## 2018-02-13 MED ORDER — INSULIN ASPART 100 UNIT/ML ~~LOC~~ SOLN
5.0000 [IU] | Freq: Once | SUBCUTANEOUS | Status: AC
  Administered 2018-02-13: 5 [IU] via SUBCUTANEOUS

## 2018-02-13 MED ORDER — INSULIN STARTER KIT- PEN NEEDLES (ENGLISH)
1.0000 | Freq: Once | Status: DC
  Filled 2018-02-13: qty 1

## 2018-02-13 MED ORDER — INSULIN ASPART 100 UNIT/ML ~~LOC~~ SOLN
0.0000 [IU] | Freq: Every day | SUBCUTANEOUS | Status: DC
  Administered 2018-02-13: 4 [IU] via SUBCUTANEOUS
  Filled 2018-02-13: qty 1

## 2018-02-13 MED ORDER — INSULIN DETEMIR 100 UNIT/ML ~~LOC~~ SOLN: 5.0000 [IU] | Freq: Two times a day (BID) | SUBCUTANEOUS | Status: DC

## 2018-02-13 MED ORDER — INSULIN ASPART 100 UNIT/ML ~~LOC~~ SOLN
1.0000 [IU] | SUBCUTANEOUS | Status: DC
  Administered 2018-02-13: 3 [IU] via SUBCUTANEOUS
  Administered 2018-02-13: 2 [IU] via SUBCUTANEOUS
  Administered 2018-02-13: 3 [IU] via SUBCUTANEOUS
  Filled 2018-02-13 (×3): qty 1

## 2018-02-13 MED ORDER — INSULIN ASPART 100 UNIT/ML ~~LOC~~ SOLN
0.0000 [IU] | Freq: Three times a day (TID) | SUBCUTANEOUS | Status: DC
  Administered 2018-02-13: 5 [IU] via SUBCUTANEOUS
  Administered 2018-02-13: 3 [IU] via SUBCUTANEOUS
  Administered 2018-02-14: 5 [IU] via SUBCUTANEOUS
  Administered 2018-02-14: 3 [IU] via SUBCUTANEOUS
  Administered 2018-02-14: 5 [IU] via SUBCUTANEOUS
  Administered 2018-02-15: 3 [IU] via SUBCUTANEOUS
  Filled 2018-02-13 (×6): qty 1

## 2018-02-13 MED ORDER — INSULIN DETEMIR 100 UNIT/ML ~~LOC~~ SOLN
10.0000 [IU] | Freq: Every day | SUBCUTANEOUS | Status: DC
  Administered 2018-02-13: 10 [IU] via SUBCUTANEOUS
  Filled 2018-02-13 (×2): qty 0.1

## 2018-02-13 MED ORDER — INSULIN ASPART 100 UNIT/ML ~~LOC~~ SOLN
5.0000 [IU] | Freq: Three times a day (TID) | SUBCUTANEOUS | Status: DC
  Administered 2018-02-13 – 2018-02-15 (×3): 5 [IU] via SUBCUTANEOUS
  Filled 2018-02-13 (×3): qty 1

## 2018-02-13 NOTE — Plan of Care (Signed)
Discussed with patient plan of care for the evening, pain management and nothing by mouth at midnight for stress test in the morning with some teach back displayed

## 2018-02-13 NOTE — Care Management Note (Signed)
Case Management Note  Patient Details  Name: Anthony MELOTT Sr. MRN: [id number] Date of Birth: 12/10/1960  Subjective/Objective:    Patient admitted with DKA.  Patient reports that he was at his PCP office this past Monday- PCP is Dr. Jeananne Rama.  Patient had blood work drawn and the office called him the next day and told him to go to the ER because his blood glucose was > 900, patient was also experiencing some chest pain.  Patient was placed on prednisone for gout and that is the suspected cause for the extreme rise in blood glucose, previously patient's DM was diet controlled.  Patient was admitted to the ICU for insulin drip, patient is now floor care.  Patient is married and lives with his wife in [place], patient is independent, works, and drives.  Pharmacy is [facility] and no issues with affording medications.  No discharge needs identified, RNCM signed off. Doran Clay RN BSN [phone]                Action/Plan:   Expected Discharge Date:                  Expected Discharge Plan:  Home/Self Care  In-House Referral:     Discharge planning Services     Post Acute Care Choice:    Choice offered to:     DME Arranged:    DME Agency:     HH Arranged:    [place] Agency:     Status of Service:  Completed, signed off  If discussed at [place] of Stay Meetings, dates discussed:    Additional Comments:  Shelbie Hutching, RN 02/13/2018, 11:50 AM

## 2018-02-13 NOTE — Progress Notes (Addendum)
ANTICOAGULATION CONSULT NOTE  Pharmacy Consult for Heparin Indication: chest pain/ACS  No Known Allergies  Patient Measurements: Height: 5\' 6"  (167.6 cm) Weight: 207 lb (93.9 kg) IBW/kg (Calculated) : 63.8 Heparin Dosing Weight: 84 kg  Vital Signs: Temp: 97.6 F (36.4 C) (01/22 2020) Temp Source: Oral (01/22 2020) BP: 148/102 (01/22 2020) Pulse Rate: 83 (01/22 2020)  Labs: Recent Labs    02/11/18 1700  02/12/18 0739 02/12/18 1039  02/12/18 1556 02/12/18 1827 02/12/18 2242 02/13/18 0325 02/13/18 0644 02/13/18 1532 02/13/18 2154  HGB 14.6  --  14.5  --   --   --   --   --  13.1  --   --   --   HCT 46.0  --  46.8  --   --   --   --   --  40.1  --   --   --   PLT 244  --  238  --   --   --   --   --  214  --   --   --   APTT  --   --  28  --   --   --   --   --   --   --   --   --   LABPROT  --   --  14.4  --   --   --   --   --   --   --   --   --   INR  --   --  1.13  --   --   --   --   --   --   --   --   --   HEPARINUNFRC  --   --   --   --    < > 0.83*  --   --   --  0.88* 0.59 0.54  CREATININE 2.74*  --  2.88* 2.63*  --   --  2.43*  --  2.41*  --   --   --   TROPONINI  --    < > 0.77* 1.64*  --  2.03*  --  2.17*  --   --  0.74*  --    < > = values in this interval not displayed.    Estimated Creatinine Clearance: 36.7 mL/min (A) (by C-G formula based on SCr of 2.41 mg/dL (H)).   Medical History: Past Medical History:  Diagnosis Date  . Diabetes mellitus without complication ([place])   . Enlarged prostate   . Gout   . Hyperlipidemia   . Hypertension   . IFG (impaired fasting glucose)   . Vitamin D deficiency     Assessment: Patient is a 58yo male admitted with possible NSTEMI. Patient was started on Heparin drip by ED MD with Heparin 4000 units IV bolus and 1000 units/hr drip.  Goal of Therapy:  Heparin level 0.3-0.7 units/ml Monitor platelets by anticoagulation protocol: Yes   Plan:  Continue heparin at 1000 units/hr. Will obtain follow up anti-Xa  level at 2200.   1/22 2200 heparin level 0.54. Continue current regimen. Recheck heparin level and CBC with tomorrow AM labs.  1/23 AM heparin level 0.55. Continue current regimen. Recheck heparin level and CBC with tomorrow AM labs.  Pharmacy will continue to monitor and adjust per consult.   Sim Boast, PharmD, BCPS  02/13/18 10:38 PM

## 2018-02-13 NOTE — Progress Notes (Signed)
PULMONARY/CCM PROGRESS NOTE   SUBJ: Off insulin drip.  No distress.  No new complaints.  Denies chest pain.    OBJ: Vitals:   02/13/18 0200 02/13/18 0400 02/13/18 0600 02/13/18 0800  BP: (!) 124/92 124/84 127/88 132/88  Pulse: 85 79 77 82  Resp: (!) 21 15 15 14   Temp:  (!) 97.2 F (36.2 C)    TempSrc:  Axillary    SpO2: 96% 96% 95%   Weight:      Height:      Room air  Gen: WDWN in NAD HEENT: NCAT, sclerae white, oropharynx normal Neck: No LAN, no JVD noted Lungs: full BS, normal percussion note throughout, no adventitious sounds Cardiovascular: Regular, normal rate, no M noted Abdomen: Soft, NT, +BS Ext: no C/C/E Neuro: PERRL, EOMI, motor/sensory grossly intact Skin: No lesions noted    BMP Latest Ref Rng & Units 02/13/2018 02/12/2018 02/12/2018  Glucose 70 - 99 mg/dL 221(H) 249(H) 777(HH)  BUN 6 - 20 mg/dL 50(H) 53(H) 61(H)  Creatinine 0.61 - 1.24 mg/dL 2.41(H) 2.43(H) 2.63(H)  BUN/Creat Ratio 9 - 20 - - -  Sodium 135 - 145 mmol/L 138 136 132(L)  Potassium 3.5 - 5.1 mmol/L 4.7 4.0 5.7(H)  Chloride 98 - 111 mmol/L 106 106 98  CO2 22 - 32 mmol/L 26 23 23   Calcium 8.9 - 10.3 mg/dL 8.7(L) 9.2 9.9    Hepatic Function Latest Ref Rng & Units 02/12/2018 02/11/2018 01/21/2018  Total Protein 6.5 - 8.1 g/dL 7.5 7.3 6.6  Albumin 3.5 - 5.0 g/dL 4.1 4.5 4.4  AST 15 - 41 U/L 23 13 21   ALT 0 - 44 U/L 23 18 22   Alk Phosphatase 38 - 126 U/L 79 92 65  Total Bilirubin 0.3 - 1.2 mg/dL 1.7(H) 1.0 0.3  Bilirubin, Direct 0.0 - 0.2 mg/dL 0.2 - -    CBC Latest Ref Rng & Units 02/13/2018 02/12/2018 02/11/2018  WBC 4.0 - 10.5 K/uL 13.8(H) 14.9(H) 13.3(H)  Hemoglobin 13.0 - 17.0 g/dL 13.1 14.5 14.6  Hematocrit 39.0 - 52.0 % 40.1 46.8 46.0  Platelets 150 - 400 K/uL 214 238 244      ABG    Component Value Date/Time   HCO3 22.5 02/12/2018 0749   ACIDBASEDEF 2.3 (H) 02/12/2018 0749   O2SAT 94.2 02/12/2018 0749    CXR: No acute findings   IMPRESSION: DKA, resolved Elevated  troponin I, NSTEMI AKI/CKD, nonoliguric   PLAN/REC: Transfer to MedSurg floor and PCCM service will sign off. Cardiology consultation has been requested Nephrology consultation has been requested   Merton Border, MD PCCM service Mobile [phone] Pager [phone] 02/13/2018 1:26 PM

## 2018-02-13 NOTE — Progress Notes (Signed)
ANTICOAGULATION CONSULT NOTE  Pharmacy Consult for Heparin Indication: chest pain/ACS  No Known Allergies  Patient Measurements: Height: 5\' 6"  (167.6 cm) Weight: 207 lb (93.9 kg) IBW/kg (Calculated) : 63.8 Heparin Dosing Weight: 84 kg  Vital Signs: Temp: 97.2 F (36.2 C) (01/22 0400) Temp Source: Axillary (01/22 0400) BP: 132/88 (01/22 0800) Pulse Rate: 82 (01/22 0800)  Labs: Recent Labs    02/11/18 1700  02/12/18 0739 02/12/18 1039 02/12/18 1556 02/12/18 1827 02/12/18 2242 02/13/18 0325 02/13/18 0644  HGB 14.6  --  14.5  --   --   --   --  13.1  --   HCT 46.0  --  46.8  --   --   --   --  40.1  --   PLT 244  --  238  --   --   --   --  214  --   APTT  --   --  28  --   --   --   --   --   --   LABPROT  --   --  14.4  --   --   --   --   --   --   INR  --   --  1.13  --   --   --   --   --   --   HEPARINUNFRC  --   --   --   --  0.83*  --   --   --  0.88*  CREATININE 2.74*  --  2.88* 2.63*  --  2.43*  --  2.41*  --   TROPONINI  --    < > 0.77* 1.64* 2.03*  --  2.17*  --   --    < > = values in this interval not displayed.    Estimated Creatinine Clearance: 36.7 mL/min (A) (by C-G formula based on SCr of 2.41 mg/dL (H)).   Medical History: Past Medical History:  Diagnosis Date  . Diabetes mellitus without complication ([place])   . Enlarged prostate   . Gout   . Hyperlipidemia   . Hypertension   . IFG (impaired fasting glucose)   . Vitamin D deficiency     Assessment: Patient is a 58yo male admitted with possible NSTEMI. Patient was started on Heparin drip by ED MD with Heparin 4000 units IV bolus and 1150 units/hr drip.  Goal of Therapy:  Heparin level 0.3-0.7 units/ml Monitor platelets by anticoagulation protocol: Yes   Plan:  Will decrease rate to 1000 units/hr. Will obtain follow up anti-Xa level at 1500.   Pharmacy will continue to monitor and adjust per consult.   MLS 02/13/2018 9:09 AM

## 2018-02-13 NOTE — Progress Notes (Signed)
Spoke with Darlyn Chamber, NP and made him aware that since levemir was given and insulin drip now off that CBG result is 301.  NP stated that he would order 5 units one time dose in addition to the 3 units of insulin from sliding scale to be given.

## 2018-02-13 NOTE — Progress Notes (Signed)
ANTICOAGULATION CONSULT NOTE  Pharmacy Consult for Heparin Indication: chest pain/ACS  No Known Allergies  Patient Measurements: Height: 5\' 6"  (167.6 cm) Weight: 207 lb (93.9 kg) IBW/kg (Calculated) : 63.8 Heparin Dosing Weight: 84 kg  Vital Signs: BP: 132/88 (01/22 0800) Pulse Rate: 82 (01/22 0800)  Labs: Recent Labs    02/11/18 1700  02/12/18 0739 02/12/18 1039 02/12/18 1556 02/12/18 1827 02/12/18 2242 02/13/18 0325 02/13/18 0644 02/13/18 1532  HGB 14.6  --  14.5  --   --   --   --  13.1  --   --   HCT 46.0  --  46.8  --   --   --   --  40.1  --   --   PLT 244  --  238  --   --   --   --  214  --   --   APTT  --   --  28  --   --   --   --   --   --   --   LABPROT  --   --  14.4  --   --   --   --   --   --   --   INR  --   --  1.13  --   --   --   --   --   --   --   HEPARINUNFRC  --   --   --   --  0.83*  --   --   --  0.88* 0.59  CREATININE 2.74*  --  2.88* 2.63*  --  2.43*  --  2.41*  --   --   TROPONINI  --    < > 0.77* 1.64* 2.03*  --  2.17*  --   --  0.74*   < > = values in this interval not displayed.    Estimated Creatinine Clearance: 36.7 mL/min (A) (by C-G formula based on SCr of 2.41 mg/dL (H)).   Medical History: Past Medical History:  Diagnosis Date  . Diabetes mellitus without complication ([place])   . Enlarged prostate   . Gout   . Hyperlipidemia   . Hypertension   . IFG (impaired fasting glucose)   . Vitamin D deficiency     Assessment: Patient is a 58yo male admitted with possible NSTEMI. Patient was started on Heparin drip by ED MD with Heparin 4000 units IV bolus and 1000 units/hr drip.  Goal of Therapy:  Heparin level 0.3-0.7 units/ml Monitor platelets by anticoagulation protocol: Yes   Plan:  Continue heparin at 1000 units/hr. Will obtain follow up anti-Xa level at 2200.   Pharmacy will continue to monitor and adjust per consult.   MLS 02/13/2018 5:30 PM

## 2018-02-13 NOTE — Plan of Care (Signed)
  RD consulted for nutrition education regarding diabetes.   Lab Results  Component Value Date   HGBA1C 10.8 (H) 02/13/2018   Met with patient and his family at bedside. He reports that elevated blood sugars is fairly new for him. He was on steroids for about 20 days for gout and he reports his blood sugars have stayed high from the steroids. He has a good appetite at baseline. He grazes/snacks throughout the day instead of having meals. He likes to eat just tortilla chips and salsa for some meals. Also likes to eat potato chips and drink a lemonade/Kool-aid mixture. We discussed changes patient can make to his meals for best glycemic control. Also discussed consistent intake throughout the day through structured meal times.  RD provided "Ready, Set, Start Counting" handout from the Academy of [employer]etics. Discussed different food groups and their effects on blood sugar, emphasizing carbohydrate-containing foods. Provided list of carbohydrates and recommended serving sizes of common foods.  Discussed importance of controlled and consistent carbohydrate intake throughout the day. Provided examples of ways to balance meals/snacks and encouraged intake of high-fiber, whole grain complex carbohydrates. Teach back method used.  Expect good compliance.  Body mass index is 33.41 kg/m. Pt meets criteria for obesity class I based on current BMI.  Current diet order is heart healthy/carbohydrate modified, patient is consuming approximately 75-100% of meals at this time. Labs and medications reviewed. No further nutrition interventions warranted at this time. RD contact information provided. If additional nutrition issues arise, please re-consult RD.  Willey Blade, MS, [place], LDN Office: [phone] Pager: [phone] After Hours/Weekend Pager: [phone]

## 2018-02-13 NOTE — Progress Notes (Signed)
Progress Note  Patient Name: Anthony KNEISEL Sr. Date of Encounter: 02/13/2018  Primary Cardiologist: Dr. Saunders Revel, [facility]  Subjective   No complaints this morning, no chest pain or shortness of breath would like to get up and walk around No PND orthopnea Reports polyuria came on quickly past several days, possibly exacerbated by prednisone, no recent change in diet  Inpatient Medications    Scheduled Meds: . aspirin EC  81 mg Oral Daily  . atorvastatin  40 mg Oral q1800  . hydrALAZINE  100 mg Oral BID  . insulin aspart  0-15 Units Subcutaneous TID WC  . insulin aspart  0-5 Units Subcutaneous QHS  . insulin aspart  5 Units Subcutaneous TID WC  . insulin detemir  10 Units Subcutaneous Daily  . metoprolol tartrate  25 mg Oral BID   Continuous Infusions: . heparin 1,000 Units/hr (02/13/18 0910)   PRN Meds:    Vital Signs    Vitals:   02/13/18 0200 02/13/18 0400 02/13/18 0600 02/13/18 0800  BP: (!) 124/92 124/84 127/88 132/88  Pulse: 85 79 77 82  Resp: (!) 21 15 15 14   Temp:  (!) 97.2 F (36.2 C)    TempSrc:  Axillary    SpO2: 96% 96% 95%   Weight:      Height:        Intake/Output Summary (Last 24 hours) at 02/13/2018 1456 Last data filed at 02/13/2018 1255 Gross per 24 hour  Intake 3895.77 ml  Output 1370 ml  Net 2525.77 ml   Last 3 Weights 02/12/2018 02/12/2018 02/11/2018  Weight (lbs) 207 lb 205 lb 14.6 oz 206 lb  Weight (kg) 93.895 kg 93.4 kg 93.441 kg      Telemetry    NSR- Personally Reviewed  ECG    - Personally Reviewed  Physical Exam   Constitutional:  oriented to person, place, and time. No distress.  HENT:  Head: Grossly normal Eyes:  no discharge. No scleral icterus.  Neck: No JVD, no carotid bruits  Cardiovascular: Regular rate and rhythm, no murmurs appreciated Pulmonary/Chest: Clear to auscultation bilaterally, no wheezes or rails Abdominal: Soft.  no distension.  no tenderness.  Musculoskeletal: Normal range of motion Neurological:   normal muscle tone. Coordination normal. No atrophy Skin: Skin warm and dry Psychiatric: normal affect, pleasant   Labs    Chemistry Recent Labs  Lab 02/11/18 1700  02/12/18 0739 02/12/18 1039 02/12/18 1827 02/13/18 0325  NA 125*   < > 122* 132* 136 138  K 5.7*  --  5.8* 5.7* 4.0 4.7  CL 83*  --  87* 98 106 106  CO2 21  --  21* 23 23 26   GLUCOSE 975*   < > 1,177* 777* 249* 221*  BUN 48*   < > 65* 61* 53* 50*  CREATININE 2.74*  --  2.88* 2.63* 2.43* 2.41*  CALCIUM 9.9  --  9.7 9.9 9.2 8.7*  PROT 7.3  --  7.5  --   --   --   ALBUMIN 4.5  --  4.1  --   --   --   AST 13  --  23  --   --   --   ALT 18  --  23  --   --   --   ALKPHOS 92  --  79  --   --   --   BILITOT 1.0  --  1.7*  --   --   --   GFRNONAA 25*  --  23* 26* 29* 29*  GFRAA 29*  --  27* 30* 33* 34*  ANIONGAP  --    < > 14 11 7 6    < > = values in this interval not displayed.     Hematology Recent Labs  Lab 02/11/18 1700 02/12/18 0739 02/13/18 0325  WBC 13.3* 14.9* 13.8*  RBC 5.17 5.31 4.77  HGB 14.6 14.5 13.1  HCT 46.0 46.8 40.1  MCV 89 88.1 84.1  MCH 28.2 27.3 27.5  MCHC 31.7 31.0 32.7  RDW 12.6 13.6 13.2  PLT 244 238 214    Cardiac Enzymes Recent Labs  Lab 02/12/18 0739 02/12/18 1039 02/12/18 1556 02/12/18 2242  TROPONINI 0.77* 1.64* 2.03* 2.17*   No results for input(s): TROPIPOC in the last 168 hours.   BNPNo results for input(s): BNP, PROBNP in the last 168 hours.   DDimer No results for input(s): DDIMER in the last 168 hours.   Radiology    Dg Chest 2 View  Result Date: 02/12/2018 CLINICAL DATA:  Chest pain EXAM: CHEST - 2 VIEW COMPARISON:  11/03/2016 FINDINGS: Low lung volumes with minor central bronchitic change. No focal pneumonia, collapse or consolidation. Negative for edema, effusion or pneumothorax. Trachea is midline. Normal heart size and vascularity. IMPRESSION: Low volume exam with minor central bronchitic change. No focal pneumonia. Electronically Signed   By: Jerilynn Mages.  Shick  M.D.   On: 02/12/2018 08:24    Cardiac Studies     Patient Profile     58 y.o. male with a hx of borderline DM, HL, TN, CKD III, and goutpresenting to the hospital with chest pain, elevated troponin, DKA, glucose levels greater than 1000   Assessment & Plan    1. Chest pain, with elevated troponin in the setting of DKA  moderate risk for cardiac related chest pain.  Prior smoking history, diabetes, hyperlipidemia LDL 180s, up from 16 1 year ago, 127 three months ago Echocardiogram with vigorous LV function ejection fraction estimated 65 to 70% Moderate LVH noted with asymmetric hypertrophy of the septum -Ischemic work-up options limited given severe renal dysfunction creatinine 2.9 on arrival now 2.4 -Repeat troponin pending -Would recommend as troponin trending downward, consider pharmacologic Myoview Can likely stop heparin once it is confirmed troponin trending down Avoid cardiac catheterization given renal dysfunction, normal ejection fraction Suspect troponin elevated from demand ischemia in the setting of moderate LVH and severe hypertension on arrival, Pressure 170/111 with episodes of tachycardia - Continue with ASA, Lipitor, heparin drip, metoprolol  2. DKA - Electrolytes and blood sugar improving.  Management per medicine service, on IV fluids  3. AKI with CKD stage III  He does have chronic baseline renal dysfunction, creatinine 2.18 November 2016 Not a good candidate for cardiac catheterization at this time  4. HTN - Blood pressure today better controlled - Will hold ACEi/ARB due to patients kidney function - Continue with Metoprolol and Apresoline  5. HL - Continue with Lipitor.  Lipid panel showed LDL at 181. Goal <70 Elevated in the setting of poorly controlled diabetes 1 year ago LDL 70   Total encounter time more than 25 minutes  Greater than 50% was spent in counseling and coordination of care with the patient   For questions or updates, please  contact [place] Please consult [email or web address] for contact info under        Signed, Ida Rogue, MD  02/13/2018, 2:56 PM

## 2018-02-13 NOTE — Progress Notes (Signed)
[facility]  ROUNDING NOTE   Subjective:   No complaints this morning.   UOP 1040  NS at 187mL/hr  Echo with diastolic dysfunction.   Objective:  Vital signs in last 24 hours:  Temp:  [97 F (36.1 C)-98.5 F (36.9 C)] 97.2 F (36.2 C) (01/22 0400) Pulse Rate:  [6-111] 82 (01/22 0800) Resp:  [14-25] 14 (01/22 0800) BP: (102-162)/(81-117) 132/88 (01/22 0800) SpO2:  [95 %-97 %] 95 % (01/22 0600)  Weight change:  Filed Weights   02/12/18 0722 02/12/18 0732  Weight: 93.4 kg 93.9 kg    Intake/Output: I/O last 3 completed shifts: In: 3175.8 [P.O.:240; I.V.:2935.8] Out: 1070 [Urine:1070]   Intake/Output this shift:  Total I/O In: 480 [P.O.:480] Out: 250 [Urine:250]  Physical Exam: General: NAD,   Head: Normocephalic, atraumatic. Moist oral mucosal membranes  Eyes: Anicteric, PERRL  Neck: Supple, trachea midline  Lungs:  Clear to auscultation  Heart: Regular rate and rhythm  Abdomen:  Soft, nontender,   Extremities:  no peripheral edema.  Neurologic: Nonfocal, moving all four extremities  Skin: No lesions        Basic Metabolic Panel: Recent Labs  Lab 02/11/18 1700 02/12/18 0739 02/12/18 1039 02/12/18 1827 02/12/18 2242 02/13/18 0325  NA 125* 122* 132* 136  --  138  K 5.7* 5.8* 5.7* 4.0  --  4.7  CL 83* 87* 98 106  --  106  CO2 21 21* 23 23  --  26  GLUCOSE 975* 1,177* 777* 249*  --  221*  BUN 48* 65* 61* 53*  --  50*  CREATININE 2.74* 2.88* 2.63* 2.43*  --  2.41*  CALCIUM 9.9 9.7 9.9 9.2  --  8.7*  MG  --   --  2.4  --  1.9 1.9  PHOS  --   --  4.2  --   --  2.1*    Liver Function Tests: Recent Labs  Lab 02/11/18 1700 02/12/18 0739  AST 13 23  ALT 18 23  ALKPHOS 92 79  BILITOT 1.0 1.7*  PROT 7.3 7.5  ALBUMIN 4.5 4.1   No results for input(s): LIPASE, AMYLASE in the last 168 hours. No results for input(s): AMMONIA in the last 168 hours.  CBC: Recent Labs  Lab 02/11/18 1700 02/12/18 0739 02/13/18 0325  WBC 13.3* 14.9*  13.8*  NEUTROABS 11.1*  --   --   HGB 14.6 14.5 13.1  HCT 46.0 46.8 40.1  MCV 89 88.1 84.1  PLT 244 238 214    Cardiac Enzymes: Recent Labs  Lab 02/12/18 0739 02/12/18 1039 02/12/18 1556 02/12/18 2242  TROPONINI 0.77* 1.64* 2.03* 2.17*    BNP: Invalid input(s): POCBNP  CBG: Recent Labs  Lab 02/12/18 2203 02/12/18 2256 02/13/18 0020 02/13/18 0323 02/13/18 0721  GLUCAP 127* 137* 301* 208* 169*    Microbiology: Results for orders placed or performed during the hospital encounter of 02/12/18  MRSA PCR Screening     Status: None   Collection Time: 02/12/18  2:13 PM  Result Value Ref Range Status   MRSA by PCR NEGATIVE NEGATIVE Final    Comment:        The GeneXpert MRSA Assay (FDA approved for NASAL specimens only), is one component of a comprehensive MRSA colonization surveillance program. It is not intended to diagnose MRSA infection nor to guide or monitor treatment for MRSA infections. Performed at [facility], [address], [place] [address]     Coagulation Studies: Recent Labs  02/12/18 0739  LABPROT 14.4  INR 1.13    Urinalysis: Recent Labs    02/12/18 0749  COLORURINE STRAW*  LABSPEC 1.022  PHURINE 5.0  GLUCOSEU >=500*  HGBUR SMALL*  BILIRUBINUR NEGATIVE  KETONESUR NEGATIVE  PROTEINUR NEGATIVE  NITRITE NEGATIVE  LEUKOCYTESUR NEGATIVE      Imaging: Dg Chest 2 View  Result Date: 02/12/2018 CLINICAL DATA:  Chest pain EXAM: CHEST - 2 VIEW COMPARISON:  11/03/2016 FINDINGS: Low lung volumes with minor central bronchitic change. No focal pneumonia, collapse or consolidation. Negative for edema, effusion or pneumothorax. Trachea is midline. Normal heart size and vascularity. IMPRESSION: Low volume exam with minor central bronchitic change. No focal pneumonia. Electronically Signed   By: Jerilynn Mages.  Shick M.D.   On: 02/12/2018 08:24     Medications:   . heparin 1,000 Units/hr (02/13/18 0910)   . aspirin EC  81 mg Oral  Daily  . atorvastatin  40 mg Oral q1800  . hydrALAZINE  100 mg Oral BID  . insulin aspart  0-15 Units Subcutaneous TID WC  . insulin aspart  0-5 Units Subcutaneous QHS  . insulin detemir  10 Units Subcutaneous Daily  . metoprolol tartrate  25 mg Oral BID     Assessment/ Plan:  [name] is a 58 y.o. black male hypertension, hypertensive retinopathy, BPH, obstructive sleep apnea, diet controlled diabetes, gout, who is admitted to [facility] on 02/12/2018   1. Acute renal failure on chronic kidney disease stage III: baseline creatinine of 1.79, GFR of 48. Last seen by nephrology in 05/2016, Dr. Holley Raring.  2. Hyperkalemia 3. Hyponatremia 4. Metabolic acidosis: anion gap of 14. Consistent with diabetic ketoacidosis 5. Diabetes mellitus type II with chronic kidney disease: no history of proteinuria. Hemoglobin A1c of 10.4%.  6. Hypertension  Plan Recommend restarting lisinopril when stable.    LOS: 1 Anthony Sellers 1/22/202011:28 AM

## 2018-02-13 NOTE — Progress Notes (Signed)
Inpatient Diabetes Program Recommendations  AACE/ADA: New Consensus Statement on Inpatient Glycemic Control (2015)  Target Ranges:  Prepandial:   less than 140 mg/dL      Peak postprandial:   less than 180 mg/dL (1-2 hours)      Critically ill patients:  140 - 180 mg/dL   Lab Results  Component Value Date   GLUCAP 169 (H) 02/13/2018   HGBA1C 10.4 (H) 02/11/2018    Review of Glycemic ControlResults for LAVANTE, TOSO SR. (MRN [id number]) as of 02/13/2018 08:34  Ref. Range 02/12/2018 22:56 02/13/2018 00:20 02/13/2018 03:23 02/13/2018 07:21  Glucose-Capillary Latest Ref Range: 70 - 99 mg/dL 137 (H) 301 (H) 208 (H) 169 (H)    Diabetes history: Borderline DM- so this is new diagnosis Outpatient Diabetes medications: None Current orders for Inpatient glycemic control:  Phase 3 ICU order set- Novolog 1-2-3 q 4 hours Inpatient Diabetes Program Recommendations:    Note patient transitioned off insulin drip on 1/21.   -Please d/c ICU glycemic control order set since patient is on PO diet.   -Consider Novolog sensitive correction tid with meals and HS -Also please add Levemir 15 units daily  Will talk to patient again today regarding insulin and proper management of DM.   Thanks  Adah Perl, RN, BC-ADM Inpatient Diabetes Coordinator Pager [phone]  (8a-5p)

## 2018-02-13 NOTE — Progress Notes (Signed)
  Spoke with patient and wife regarding DM.  He met earlier with dietician as well. Discussed insulin use with patient and wife.  Patient very wary of insulin stating "I want to be on pills".  We discussed A1C results and that he was not a candidate for some DM medications due to his kidney function.   Showed him how to use insulin pen including how to apply needle, 2 unit prime, dialing dose, injecting, etc.  Allowed patient to practice using insulin pen as well using insulin injection dome.  Patient did well however he continued to say "I don't want to use insulin".  We discussed injection sites as well.  Will re-visit patient tomorrow to further discuss d/c plans.  He states "if he has too" he would be agreeable to one shot per day.   May consider Levemir once daily and possibly Tradjenta 5 mg daily as well for d/c.  He will need to f/u with PCP ASAP. Encouraged RN to allow patient to self-administer insulin and check his own blood sugars using hospital meter.    Thanks  Adah Perl, RN, BC-ADM Inpatient Diabetes Coordinator Pager [phone] (8a-5p)

## 2018-02-13 NOTE — Progress Notes (Addendum)
[place] at [place] NAME: Anthony Sellers    MR#:  [id number]  DATE OF BIRTH:  1960/05/19  SUBJECTIVE:  CHIEF COMPLAINT:   Chief Complaint  Patient presents with  . Chest Pain  . Hyperglycemia   No new complaint this morning.  No chest pain.  No shortness of breath  Already weaned off insulin drip.  Currently on heparin drip.  REVIEW OF SYSTEMS:  Review of Systems  Constitutional: Negative for chills, fever and weight loss.  HENT: Negative for hearing loss and tinnitus.   Eyes: Negative for blurred vision and double vision.  Respiratory: Negative for cough and hemoptysis.   Cardiovascular: Negative for chest pain and palpitations.  Gastrointestinal: Negative for heartburn and nausea.  Genitourinary: Negative for dysuria and urgency.  Musculoskeletal: Negative for myalgias and neck pain.  Skin: Negative for itching and rash.  Neurological: Negative for dizziness and headaches.  Psychiatric/Behavioral: Negative for depression and substance abuse.      DRUG ALLERGIES:  No Known Allergies VITALS:  Blood pressure 132/88, pulse 82, temperature (!) 97.2 F (36.2 C), temperature source Axillary, resp. rate 14, height 5\' 6"  (1.676 m), weight 93.9 kg, SpO2 95 %. PHYSICAL EXAMINATION:   Physical Exam  Constitutional: He is oriented to person, place, and time. He appears well-developed and well-nourished.  HENT:  Head: Normocephalic and atraumatic.  Eyes: Pupils are equal, round, and reactive to light. Conjunctivae and EOM are normal.  Neck: Neck supple. No tracheal deviation present.  Cardiovascular: Normal rate, regular rhythm and normal heart sounds.  Respiratory: Effort normal and breath sounds normal.  GI: Soft. Bowel sounds are normal. There is no abdominal tenderness.  Musculoskeletal: Normal range of motion.        General: No tenderness or edema.  Neurological: He is alert and oriented to person, place, and time.  Skin: Skin  is warm. No erythema.  Psychiatric: He has a normal mood and affect. His behavior is normal.   LABORATORY PANEL:  Male CBC Recent Labs  Lab 02/13/18 0325  WBC 13.8*  HGB 13.1  HCT 40.1  PLT 214   ------------------------------------------------------------------------------------------------------------------ Chemistries  Recent Labs  Lab 02/12/18 0739  02/13/18 0325  NA 122*   < > 138  K 5.8*   < > 4.7  CL 87*   < > 106  CO2 21*   < > 26  GLUCOSE 1,177*   < > 221*  BUN 65*   < > 50*  CREATININE 2.88*   < > 2.41*  CALCIUM 9.7   < > 8.7*  MG  --    < > 1.9  AST 23  --   --   ALT 23  --   --   ALKPHOS 79  --   --   BILITOT 1.7*  --   --    < > = values in this interval not displayed.   RADIOLOGY:  No results found. ASSESSMENT AND PLAN:   Patient is a 58 year old African-American male with history of borderline diabetes mellitus, hypertension and DKA who was recently treated for gout flareup with 12-day course of prednisone taper now being admitted for management of DKA with blood sugar greater than 1000 and elevated troponin of 0.77.  1.  Diabetic ketoacidosis Patient was initially admitted to ICU and started on insulin drip following DKA protocol. Anion gap already corrected.  Patient weaned off insulin drip last night.  Glycosylated hemoglobin level of 10.8 suggestive of poor  blood sugar control over the last few months. Diabetic education Patient already started on Levemir insulin 10 units subcu daily.  Added pre-meal NovoLog insulin 5 meals with each meal. Monitor blood sugars and adjust regimen over the next 24 hours. Patient claimed to have borderline diabetes mellitus.  Elevated glycosylated hemoglobin level however suggest poorly controlled blood sugars for the last few months.  2.    Non-ST MI elevation.   Noted for elevation of troponin to 2.17.   Patient already seen by cardiologist.  Currently on heparin drip.  Plans to be on heparin drip for at least 48  hours. Recommendation was for stress test after DKA completely resolved and after troponin peaks.  Requested for repeat troponin level  continue aspirin, metoprolol, atorvastatin 2D echocardiogram done with normal ejection fraction with significant LVH.  3.  Hyponatremia with sodium of 122 This is secondary to pseudohyponatremia from hyperglycemia with blood sugar greater than 1000. Resolved with sodium level back to normal with correction of hyperglycemia  4.  Mild hyperkalemia with potassium of 5.8 Resolved  5.  Acute kidney injury Superimposed on chronic kidney disease stage III Already placed on IV fluids.  Likely secondary to dehydration Nephrology service following.  Appreciate input. Holding off on home dose of spironolactone and ACE inhibitor as well as chlorthalidone. Follow-up on BMP in a.m.  DVT prophylaxis; patient on heparin drip already  Disposition; plans for transfer out of ICU once bed available   All the records are reviewed and case discussed with Care Management/Social Worker. Management plans discussed with the patient, family and they are in agreement.  CODE STATUS: Prior  TOTAL TIME TAKING CARE OF THIS PATIENT: 38 minutes.   More than 50% of the time was spent in counseling/coordination of care: YES  POSSIBLE D/C IN 2 DAYS, DEPENDING ON CLINICAL CONDITION.   Anthony Sellers M.D on 02/13/2018 at 1:52 PM  Between 7am to 6pm - Pager - [phone]  After 6pm go to [email or web address] - Proofreader  [facility]  Office  [phone]  CC: Primary care physician; Anthony Roys, DO  Note: This dictation was prepared with Dragon dictation along with smaller phrase technology. Any transcriptional errors that result from this process are unintentional.

## 2018-02-14 ENCOUNTER — Inpatient Hospital Stay (HOSPITAL_COMMUNITY): Payer: 59

## 2018-02-14 DIAGNOSIS — R079 Chest pain, unspecified: Secondary | ICD-10-CM

## 2018-02-14 DIAGNOSIS — E875 Hyperkalemia: Secondary | ICD-10-CM

## 2018-02-14 LAB — CBC
HCT: 37.6 % — ABNORMAL LOW (ref 39.0–52.0)
Hemoglobin: 12.3 g/dL — ABNORMAL LOW (ref 13.0–17.0)
MCH: 27.5 pg (ref 26.0–34.0)
MCHC: 32.7 g/dL (ref 30.0–36.0)
MCV: 83.9 fL (ref 80.0–100.0)
NRBC: 0 % (ref 0.0–0.2)
Platelets: 170 10*3/uL (ref 150–400)
RBC: 4.48 MIL/uL (ref 4.22–5.81)
RDW: 13.2 % (ref 11.5–15.5)
WBC: 8.3 10*3/uL (ref 4.0–10.5)

## 2018-02-14 LAB — NM MYOCAR MULTI W/SPECT W/WALL MOTION / EF
CSEPHR: 66 %
Estimated workload: 1 METS
Exercise duration (min): 0 min
Exercise duration (sec): 0 s
LV dias vol: 116 mL (ref 62–150)
LV sys vol: 70 mL
MPHR: 164 {beats}/min
Peak HR: 109 {beats}/min
Rest HR: 81 {beats}/min
SDS: 0
SRS: 0
SSS: 0
TID: 0.99

## 2018-02-14 LAB — GLUCOSE, CAPILLARY
Glucose-Capillary: 154 mg/dL — ABNORMAL HIGH (ref 70–99)
Glucose-Capillary: 186 mg/dL — ABNORMAL HIGH (ref 70–99)
Glucose-Capillary: 213 mg/dL — ABNORMAL HIGH (ref 70–99)
Glucose-Capillary: 216 mg/dL — ABNORMAL HIGH (ref 70–99)
Glucose-Capillary: 226 mg/dL — ABNORMAL HIGH (ref 70–99)

## 2018-02-14 LAB — MAGNESIUM: MAGNESIUM: 1.7 mg/dL (ref 1.7–2.4)

## 2018-02-14 LAB — BASIC METABOLIC PANEL
Anion gap: 5 (ref 5–15)
BUN: 41 mg/dL — ABNORMAL HIGH (ref 6–20)
CO2: 24 mmol/L (ref 22–32)
Calcium: 8.2 mg/dL — ABNORMAL LOW (ref 8.9–10.3)
Chloride: 105 mmol/L (ref 98–111)
Creatinine, Ser: 1.76 mg/dL — ABNORMAL HIGH (ref 0.61–1.24)
GFR calc Af Amer: 49 mL/min — ABNORMAL LOW (ref >60)
GFR calc non Af Amer: 42 mL/min — ABNORMAL LOW (ref >60)
Glucose, Bld: 260 mg/dL — ABNORMAL HIGH (ref 70–99)
POTASSIUM: 3.8 mmol/L (ref 3.5–5.1)
Sodium: 134 mmol/L — ABNORMAL LOW (ref 135–145)

## 2018-02-14 LAB — HEPARIN LEVEL (UNFRACTIONATED): Heparin Unfractionated: 0.55 IU/mL (ref 0.30–0.70)

## 2018-02-14 LAB — PHOSPHORUS: Phosphorus: 3.4 mg/dL (ref 2.5–4.6)

## 2018-02-14 MED ORDER — REGADENOSON 0.4 MG/5ML IV SOLN
0.4000 mg | Freq: Once | INTRAVENOUS | Status: AC
  Administered 2018-02-14: 0.4 mg via INTRAVENOUS
  Filled 2018-02-14: qty 5

## 2018-02-14 MED ORDER — ATORVASTATIN CALCIUM 20 MG PO TABS
80.0000 mg | ORAL_TABLET | Freq: Every day | ORAL | Status: DC
  Administered 2018-02-14: 80 mg via ORAL
  Filled 2018-02-14: qty 4

## 2018-02-14 MED ORDER — INSULIN DETEMIR 100 UNIT/ML ~~LOC~~ SOLN
18.0000 [IU] | Freq: Every day | SUBCUTANEOUS | Status: DC
  Administered 2018-02-14 – 2018-02-15 (×2): 18 [IU] via SUBCUTANEOUS
  Filled 2018-02-14 (×3): qty 0.18

## 2018-02-14 MED ORDER — TECHNETIUM TC 99M TETROFOSMIN IV KIT
10.3300 | PACK | Freq: Once | INTRAVENOUS | Status: AC | PRN
  Administered 2018-02-14: 10.33 via INTRAVENOUS

## 2018-02-14 MED ORDER — ENOXAPARIN SODIUM 40 MG/0.4ML ~~LOC~~ SOLN
40.0000 mg | SUBCUTANEOUS | Status: DC
  Administered 2018-02-14: 40 mg via SUBCUTANEOUS
  Filled 2018-02-14: qty 0.4

## 2018-02-14 MED ORDER — TECHNETIUM TC 99M TETROFOSMIN IV KIT
30.0000 | PACK | Freq: Once | INTRAVENOUS | Status: AC | PRN
  Administered 2018-02-14: 32.152 via INTRAVENOUS

## 2018-02-14 NOTE — Progress Notes (Signed)
[place] at [place] NAME: Anthony Sellers    MR#:  [id number]  DATE OF BIRTH:  1960-07-28  SUBJECTIVE:  CHIEF COMPLAINT:   Chief Complaint  Patient presents with  . Chest Pain  . Hyperglycemia   No new complaint this morning.  No chest pain.  No shortness of breath .  Patient ambulating around the medical floor with no chest pain or shortness of breath.  Heparin drip discontinued this morning.  Patient having stress test done today.  REVIEW OF SYSTEMS:  Review of Systems  Constitutional: Negative for chills, fever and weight loss.  HENT: Negative for hearing loss and tinnitus.   Eyes: Negative for blurred vision and double vision.  Respiratory: Negative for cough and hemoptysis.   Cardiovascular: Negative for chest pain and palpitations.  Gastrointestinal: Negative for heartburn and nausea.  Genitourinary: Negative for dysuria and urgency.  Musculoskeletal: Negative for myalgias and neck pain.  Skin: Negative for itching and rash.  Neurological: Negative for dizziness and headaches.  Psychiatric/Behavioral: Negative for depression and substance abuse.      DRUG ALLERGIES:  No Known Allergies VITALS:  Blood pressure (!) 141/96, pulse 77, temperature 97.9 F (36.6 C), resp. rate 16, height 5\' 6"  (1.676 m), weight 93.9 kg, SpO2 100 %. PHYSICAL EXAMINATION:   Physical Exam  Constitutional: He is oriented to person, place, and time. He appears well-developed and well-nourished.  HENT:  Head: Normocephalic and atraumatic.  Eyes: Pupils are equal, round, and reactive to light. Conjunctivae and EOM are normal.  Neck: Neck supple. No tracheal deviation present.  Cardiovascular: Normal rate, regular rhythm and normal heart sounds.  Respiratory: Effort normal and breath sounds normal.  GI: Soft. Bowel sounds are normal. There is no abdominal tenderness.  Musculoskeletal: Normal range of motion.        General: No tenderness or edema.    Neurological: He is alert and oriented to person, place, and time.  Skin: Skin is warm. No erythema.  Psychiatric: He has a normal mood and affect. His behavior is normal.   LABORATORY PANEL:  Male CBC Recent Labs  Lab 02/14/18 0402  WBC 8.3  HGB 12.3*  HCT 37.6*  PLT 170   ------------------------------------------------------------------------------------------------------------------ Chemistries  Recent Labs  Lab 02/12/18 0739  02/14/18 0402  NA 122*   < > 134*  K 5.8*   < > 3.8  CL 87*   < > 105  CO2 21*   < > 24  GLUCOSE 1,177*   < > 260*  BUN 65*   < > 41*  CREATININE 2.88*   < > 1.76*  CALCIUM 9.7   < > 8.2*  MG  --    < > 1.7  AST 23  --   --   ALT 23  --   --   ALKPHOS 79  --   --   BILITOT 1.7*  --   --    < > = values in this interval not displayed.   RADIOLOGY:  Nm Myocar Multi W/spect W/wall Motion / Ef  Result Date: 02/14/2018 Pharmacological myocardial perfusion imaging study with no significant  ischemia Normal wall motion, EF estimated at 35%, decreased ejection fraction likely secondary to GI uptake artifact No EKG changes concerning for ischemia at peak stress or in recovery. EKG at rest with frequent PVCs Echocardiogram confirming ejection fraction greater than 55% Low risk scan Signed, Esmond Plants, MD, Ph.D [facility]   ASSESSMENT AND PLAN:  Patient is a 58 year old African-American male with history of borderline diabetes mellitus, hypertension and DKA who was recently treated for gout flareup with 12-day course of prednisone taper now being admitted for management of DKA with blood sugar greater than 1000 and elevated troponin of 0.77.  1.  Diabetic ketoacidosis Patient was initially admitted to ICU and started on insulin drip following DKA protocol. Anion gap already corrected.  Patient weaned off insulin drip last night.  Glycosylated hemoglobin level of 10.8 suggestive of poor blood sugar control over the last few months. Diabetic  education. Increased dose of Levemir insulin from 10 units to 18 units subcu nightly.  Continue pre-meal insulin.  Monitor blood sugars and adjust regimen as needed. Nursing staff to educate patient on self administration of insulin since patient is newly diagnosed with diabetes mellitus. Patient claimed to have borderline diabetes mellitus.  Elevated glycosylated hemoglobin level however suggest poorly controlled blood sugars for the last few months.  2.    Non-ST MI elevation.   Noted for elevation of troponin to 2.17.   Patient already seen by cardiologist.  Patient has been on heparin drip for at least 48 hours.  This was discontinued this morning.  Patient remains chest pain-free.  No shortness of breath.   Patient reevaluated by cardiologist.  Recommendation was for stress test which is being done today.  Avoiding cardiac catheterization due to renal failure.   Cardiologist suspects troponin elevation is likely from demand ischemia in the setting of moderate LVH and severe hypertension on arrival.  Troponin level trended down.  Continue aspirin, metoprolol, atorvastatin 2D echocardiogram done with normal ejection fraction with significant LVH.  3.  Hyponatremia with sodium of 122 This is secondary to pseudohyponatremia from hyperglycemia with blood sugar greater than 1000. Resolved with sodium level back to normal with correction of hyperglycemia  4.  Mild hyperkalemia with potassium of 5.8 Resolved  5.  Acute kidney injury Superimposed on chronic kidney disease stage III Already placed on IV fluids.  Likely secondary to dehydration Nephrology service following.  Appreciate input. Holding off on home dose of spironolactone and ACE inhibitor as well as chlorthalidone. Renal function improving with creatinine down to 1.76  DVT prophylaxis;  heparin drip discontinued Started on Lovenox   Disposition; plans possible discharge in a.m. if renal function continues to improve and stress  test negative   All the records are reviewed and case discussed with Care Management/Social Worker. Management plans discussed with the patient, family and they are in agreement.  CODE STATUS: Prior  TOTAL TIME TAKING CARE OF THIS PATIENT: 36 minutes.   More than 50% of the time was spent in counseling/coordination of care: YES  POSSIBLE D/C IN 2 DAYS, DEPENDING ON CLINICAL CONDITION.   Keirstan Iannello M.D on 02/14/2018 at 12:48 PM  Between 7am to 6pm - Pager - [phone]  After 6pm go to [email or web address] - Proofreader  [facility]  Office  [phone]  CC: Primary care physician; Valerie Roys, DO  Note: This dictation was prepared with Dragon dictation along with smaller phrase technology. Any transcriptional errors that result from this process are unintentional.

## 2018-02-14 NOTE — Progress Notes (Signed)
Progress Note  Patient Name: Anthony OLIVERA Sr. Date of Encounter: 02/14/2018  Primary Cardiologist: Dr. Saunders Revel, [facility]  Subjective   Feels improved today.   No reported chest pain, palpitations, feeling of racing heart rate. No SOB.   He's been ambulating around the hospital today, and stated he's done about 10-12 laps "casing the joint for a snack machine to get some potato chips." We discussed modifying his diet again, as he indicated he is "withdrawing from [place]." We also again discussed reducing 1/2 gallon Kool-Aid, mixing tropical punch and lemonade with "a little crushed ice.. I'm addicted... Can you put some in my IV?"  Stress testing performed after physical test today and pending results.  Inpatient Medications    Scheduled Meds: . aspirin EC  81 mg Oral Daily  . atorvastatin  40 mg Oral q1800  . hydrALAZINE  100 mg Oral BID  . insulin aspart  0-15 Units Subcutaneous TID WC  . insulin aspart  0-5 Units Subcutaneous QHS  . insulin aspart  5 Units Subcutaneous TID WC  . insulin detemir  10 Units Subcutaneous Daily  . insulin starter kit- pen needles  1 kit Other Once  . metoprolol tartrate  25 mg Oral BID   Continuous Infusions: . heparin 1,000 Units/hr (02/14/18 0831)   PRN Meds:    Vital Signs    Vitals:   02/13/18 1700 02/13/18 2020 02/13/18 2318 02/14/18 0750  BP: 118/86 (!) 148/102 117/74 134/84  Pulse:  83 81 77  Resp: 18 18 17    Temp: 98.1 F (36.7 C) 97.6 F (36.4 C) 97.8 F (36.6 C) 97.9 F (36.6 C)  TempSrc: Oral Oral Oral   SpO2: 96% 100% 98% 100%  Weight:      Height:        Intake/Output Summary (Last 24 hours) at 02/14/2018 0842 Last data filed at 02/14/2018 0525 Gross per 24 hour  Intake 1429.57 ml  Output 250 ml  Net 1179.57 ml   Filed Weights   02/12/18 0722 02/12/18 0732  Weight: 93.4 kg 93.9 kg    Telemetry    NSR - Personally Reviewed  ECG    No new tracings - Personally Reviewed  Physical Exam   GEN: No acute  distress.  Energetic  Neck: No JVD Cardiac: RRR, no murmurs, rubs, or gallops.  Respiratory: Clear to auscultation bilaterally. GI: Soft, nontender, non-distended  MS: No edema; No deformity. Neuro:  Nonfocal  Psych: Normal affect   Labs    Chemistry Recent Labs  Lab 02/11/18 1700 02/12/18 0739  02/12/18 1827 02/13/18 0325 02/14/18 0402  NA 125* 122*   < > 136 138 134*  K 5.7* 5.8*   < > 4.0 4.7 3.8  CL 83* 87*   < > 106 106 105  CO2 21 21*   < > 23 26 24   GLUCOSE 975* 1,177*   < > 249* 221* 260*  BUN 48* 65*   < > 53* 50* 41*  CREATININE 2.74* 2.88*   < > 2.43* 2.41* 1.76*  CALCIUM 9.9 9.7   < > 9.2 8.7* 8.2*  PROT 7.3 7.5  --   --   --   --   ALBUMIN 4.5 4.1  --   --   --   --   AST 13 23  --   --   --   --   ALT 18 23  --   --   --   --   ALKPHOS 92  79  --   --   --   --   BILITOT 1.0 1.7*  --   --   --   --   GFRNONAA 25* 23*   < > 29* 29* 42*  GFRAA 29* 27*   < > 33* 34* 49*  ANIONGAP  --  14   < > 7 6 5    < > = values in this interval not displayed.     Hematology Recent Labs  Lab 02/12/18 0739 02/13/18 0325 02/14/18 0402  WBC 14.9* 13.8* 8.3  RBC 5.31 4.77 4.48  HGB 14.5 13.1 12.3*  HCT 46.8 40.1 37.6*  MCV 88.1 84.1 83.9  MCH 27.3 27.5 27.5  MCHC 31.0 32.7 32.7  RDW 13.6 13.2 13.2  PLT 238 214 170    Cardiac Enzymes Recent Labs  Lab 02/12/18 1556 02/12/18 2242 02/13/18 1532 02/13/18 2154  TROPONINI 2.03* 2.17* 0.74* 0.58*   No results for input(s): TROPIPOC in the last 168 hours.   BNPNo results for input(s): BNP, PROBNP in the last 168 hours.   DDimer No results for input(s): DDIMER in the last 168 hours.   Radiology    No results found.  Cardiac Studies   NM Myoview Pending  02/12/2018 TTE Study Conclusions - Left ventricle: The cavity size was normal. Wall thickness was   increased in a pattern of moderate LVH. There was asymmetric   hypertrophy of the septum. Hypertrophic cardiomyopathy is a   consideration. Systolic  function was vigorous. The estimated   ejection fraction was in the range of 65% to 70%. Regional wall   motion abnormalities cannot be excluded. Doppler parameters are   consistent with abnormal left ventricular relaxation (grade 1   diastolic dysfunction). - Right ventricle: The cavity size was normal. Wall thickness was   at the upper limits of normal. Systolic function was normal. - Pericardium, extracardiac: A trivial pericardial effusion was   identified.  Patient Profile     58 y.o. male with hx of hypertension, DM2, hyperlipidemia, CKD 3, and gout who is being seen today for the evaluation of admitted troponin with DKA at the request of Dr. Stark Jock.  Assessment & Plan    Chest pain with elevated troponin in setting of DKA -No current chest pain. Moderate risk for cardiac related CP based on prior history of smoking, diabetes, hyperlipidemia with most recent LDL 180s. TTE as above with EF 65 to 70%, moderate LVH, asymmetric hypertrophy of the septum. Episodes of tachycardia on telemetry per EMR this admission. - NM performed today, given troponin downtrending from 2.17 and most recently 0.58.  Pending results of stress test. If negative for ischemia, will plan to stop heparin today. Pending NM results, no plan for invasive workup at this time.  - Daily CBC, BMET. Renal function / Cr improving 2.41  1.76 and close to baseline Cr 1.7. Hyperkalemia resolved with insulin. - Suspicion for troponin elevation in the setting of supply demand ischemia given moderate LVH and severe hypertension on arrival. Further recommendations pending NM study. Continue medical management with ASA, Lipitor, metoprolol, antihypertensive medications as below. Will need follow-up scheduled in the office.  DKA - Treatment received, glucose improving and most recently 260. SSI per IM. Recommend PCP follow-up to monitor. Recommend start oral medication to control glucose at discharge and per IM.   AKI with CKD III -  BMET daily. Hyperkalemia resolved with insulin. Have been holding ACE / lisinopril 85m daily since admission d/t reduced renal function  and hyperkalemia.  - Recommend restart PTA lisinopril as renal function at baseline and K stable and at goal and in accordance with guideline directed therapy given comorbids HTN and DM2.  Could also consider restarting PTA spironolactone as well with follow-up BMET to ensure this does not bump K again. - Recommend aggressive risk factor modification with strict control of BP/DM2  HTN - BP 140s - Continue to monitor vitals. Recommend restart ACE lisinopril and spironolactone as above and given return of renal function to baseline and K now at goal. Close monitoring of BMET and BP with restart.  HLD - Started on statin. Continue statin /aggressive risk factor modification. Recommend follow-up labs to assess lipids in 8 weeks as started statin this admission.  For questions or updates, please contact [place] Please consult [email or web address] for contact info under        Signed, Arvil Chaco, PA-C  02/14/2018, 8:42 AM

## 2018-02-14 NOTE — Progress Notes (Signed)
Reinforced insulin pen teaching with patient.  He was able to return demonstration.  He states that he gave his own insulin last night and did well.  He seems much more agreeable to taking insulin today.  May consider one shot of insulin daily plus oral agent such as Tradjenta 5 mg daily (not renally cleared).    Upon d/c note that [facility] covers [place] as Tier 1 therefore at d/c consider: 1) Basaglar 20 units daily (Order [phone])- insulin pen 2) insulin pen needles (Order [phone]) 3) glucose meter/lancets, etc (Order #[id number])  He also has order for outpatient DM education as well.    He states that he has no further questions re. Diabetes.  We reviewed survival skills on 1/21 and 1/22.  Will follow.   Thanks,  Adah Perl, RN, BC-ADM Inpatient Diabetes Coordinator Pager [phone] (8a-5p)

## 2018-02-14 NOTE — Plan of Care (Signed)
Discussed with patient plan of care for the evening, pain management and a diabetic type diet with some teach back displayed.  Discussed the importance of balanced meals with roughly the same number of calories, fats, proteins and carbohydrates.  Importance of getting with his primary doctor or nutritionist after being discharged.

## 2018-02-14 NOTE — Progress Notes (Signed)
Inpatient Diabetes Program Recommendations  AACE/ADA: New Consensus Statement on Inpatient Glycemic Control (2015)  Target Ranges:  Prepandial:   less than 140 mg/dL      Peak postprandial:   less than 180 mg/dL (1-2 hours)      Critically ill patients:  140 - 180 mg/dL   Lab Results  Component Value Date   GLUCAP 226 (H) 02/14/2018   HGBA1C 10.8 (H) 02/13/2018    Review of Glycemic ControlResults for AMARII, BORDAS SR. (MRN [id number]) as of 02/14/2018 08:34  Ref. Range 02/13/2018 17:07 02/13/2018 20:04 02/13/2018 23:21 02/14/2018 07:49  Glucose-Capillary Latest Ref Range: 70 - 99 mg/dL 171 (H) 332 (H) 279 (H) 226 (H)   Diabetes history: Borderline DM- so this is new diagnosis Outpatient Diabetes medications: None Current orders for Inpatient glycemic control:  Novolog moderate tid with meals and HS Levemir 10 units q HS Novolog 5 units tid with meals  Inpatient Diabetes Program Recommendations:    Fasting blood sugars continue>200 mg/dL.  Please consider increasing Levemir to 18 units q HS.  Spoke with patient yesterday regarding probable need for insulin at d/c.  He is hesitant but states "I will do what I need too".  Will follow up with patient today.    Thanks  Adah Perl, RN, BC-ADM Inpatient Diabetes Coordinator Pager [phone] (8a-5p)

## 2018-02-15 LAB — BASIC METABOLIC PANEL
Anion gap: 6 (ref 5–15)
BUN: 37 mg/dL — ABNORMAL HIGH (ref 6–20)
CALCIUM: 8.4 mg/dL — AB (ref 8.9–10.3)
CO2: 23 mmol/L (ref 22–32)
CREATININE: 1.54 mg/dL — AB (ref 0.61–1.24)
Chloride: 104 mmol/L (ref 98–111)
GFR calc Af Amer: 58 mL/min — ABNORMAL LOW (ref >60)
GFR, EST NON AFRICAN AMERICAN: 50 mL/min — AB (ref >60)
Glucose, Bld: 237 mg/dL — ABNORMAL HIGH (ref 70–99)
Potassium: 4 mmol/L (ref 3.5–5.1)
Sodium: 133 mmol/L — ABNORMAL LOW (ref 135–145)

## 2018-02-15 LAB — CBC
HCT: 37.1 % — ABNORMAL LOW (ref 39.0–52.0)
Hemoglobin: 12.1 g/dL — ABNORMAL LOW (ref 13.0–17.0)
MCH: 27.3 pg (ref 26.0–34.0)
MCHC: 32.6 g/dL (ref 30.0–36.0)
MCV: 83.6 fL (ref 80.0–100.0)
PLATELETS: 153 10*3/uL (ref 150–400)
RBC: 4.44 MIL/uL (ref 4.22–5.81)
RDW: 12.8 % (ref 11.5–15.5)
WBC: 6 10*3/uL (ref 4.0–10.5)
nRBC: 0 % (ref 0.0–0.2)

## 2018-02-15 LAB — MAGNESIUM: Magnesium: 1.8 mg/dL (ref 1.7–2.4)

## 2018-02-15 LAB — GLUCOSE, CAPILLARY: Glucose-Capillary: 189 mg/dL — ABNORMAL HIGH (ref 70–99)

## 2018-02-15 LAB — HEPARIN LEVEL (UNFRACTIONATED): Heparin Unfractionated: 0.14 IU/mL — ABNORMAL LOW (ref 0.30–0.70)

## 2018-02-15 MED ORDER — ATORVASTATIN CALCIUM 80 MG PO TABS: 80.0000 mg | ORAL_TABLET | Freq: Every day | ORAL | 0 refills | Status: DC

## 2018-02-15 MED ORDER — INSULIN STARTER KIT- PEN NEEDLES (ENGLISH): 1.0000 | Freq: Once | 0 refills | Status: AC

## 2018-02-15 MED ORDER — METOPROLOL TARTRATE 25 MG PO TABS: 25.0000 mg | ORAL_TABLET | Freq: Two times a day (BID) | ORAL | 0 refills | Status: DC

## 2018-02-15 MED ORDER — INSULIN ASPART 100 UNIT/ML ~~LOC~~ SOLN: 5.0000 [IU] | Freq: Three times a day (TID) | SUBCUTANEOUS | 0 refills | Status: DC

## 2018-02-15 MED ORDER — INSULIN DETEMIR 100 UNIT/ML ~~LOC~~ SOLN: 20.0000 [IU] | Freq: Every day | SUBCUTANEOUS | 0 refills | Status: DC

## 2018-02-15 MED ORDER — BLOOD GLUCOSE MONITOR KIT: PACK | 0 refills | Status: DC

## 2018-02-15 MED ORDER — ASPIRIN 81 MG PO TBEC: 81.0000 mg | DELAYED_RELEASE_TABLET | Freq: Every day | ORAL | 0 refills | Status: DC

## 2018-02-15 NOTE — Progress Notes (Signed)
[facility]  ROUNDING NOTE   Subjective:   Walking the halls this morning.   Objective:  Vital signs in last 24 hours:  Temp:  [97.5 F (36.4 C)-98.3 F (36.8 C)] 97.5 F (36.4 C) (01/24 0806) Pulse Rate:  [77-93] 77 (01/24 0806) Resp:  [18] 18 (01/24 0806) BP: (104-158)/(83-102) 132/90 (01/24 0806) SpO2:  [99 %-100 %] 99 % (01/24 0806)  Weight change:  Filed Weights   02/12/18 0722 02/12/18 0732  Weight: 93.4 kg 93.9 kg    Intake/Output: I/O last 3 completed shifts: In: 7371 [P.O.:1320; I.V.:277] Out: -    Intake/Output this shift:  No intake/output data recorded.  Physical Exam: General: NAD,   Head: Normocephalic, atraumatic. Moist oral mucosal membranes  Eyes: Anicteric, PERRL  Neck: Supple, trachea midline  Lungs:  Clear to auscultation  Heart: Regular rate and rhythm  Abdomen:  Soft, nontender,   Extremities:  no peripheral edema.  Neurologic: Nonfocal, moving all four extremities  Skin: No lesions        Basic Metabolic Panel: Recent Labs  Lab 02/12/18 1039 02/12/18 1827 02/12/18 2242 02/13/18 0325 02/14/18 0402 02/15/18 0500  NA 132* 136  --  138 134* 133*  K 5.7* 4.0  --  4.7 3.8 4.0  CL 98 106  --  106 105 104  CO2 23 23  --  _0 GLUCOSE 777* 249*  --  221* 260* 237*  BUN 61* 53*  --  50* 41* 37*  CREATININE 2.63* 2.43*  --  2.41* 1.76* 1.54*  CALCIUM 9.9 9.2  --  8.7* 8.2* 8.4*  MG 2.4  --  1.9 1.9 1.7 1.8  PHOS 4.2  --   --  2.1* 3.4  --     Liver Function Tests: Recent Labs  Lab 02/11/18 1700 02/12/18 0739  AST 13 23  ALT 18 23  ALKPHOS 92 79  BILITOT 1.0 1.7*  PROT 7.3 7.5  ALBUMIN 4.5 4.1   No results for input(s): LIPASE, AMYLASE in the last 168 hours. No results for input(s): AMMONIA in the last 168 hours.  CBC: Recent Labs  Lab 02/11/18 1700 02/12/18 0739 02/13/18 0325 02/14/18 0402 02/15/18 0500  WBC 13.3* 14.9* 13.8* 8.3 6.0  NEUTROABS 11.1*  --   --   --   --   HGB 14.6 14.5 13.1 12.3*  12.1*  HCT 46.0 46.8 40.1 37.6* 37.1*  MCV 89 88.1 84.1 83.9 83.6  PLT 244 238 214 170 153    Cardiac Enzymes: Recent Labs  Lab 02/12/18 1039 02/12/18 1556 02/12/18 2242 02/13/18 1532 02/13/18 2154  TROPONINI 1.64* 2.03* 2.17* 0.74* 0.58*    BNP: Invalid input(s): POCBNP  CBG: Recent Labs  Lab 02/14/18 1208 02/14/18 1637 02/14/18 2119 02/14/18 2333 02/15/18 0808  GLUCAP 186* 213* 154* 216* 189*    Microbiology: Results for orders placed or performed during the hospital encounter of 02/12/18  MRSA PCR Screening     Status: None   Collection Time: 02/12/18  2:13 PM  Result Value Ref Range Status   MRSA by PCR NEGATIVE NEGATIVE Final    Comment:        The GeneXpert MRSA Assay (FDA approved for NASAL specimens only), is one component of a comprehensive MRSA colonization surveillance program. It is not intended to diagnose MRSA infection nor to guide or monitor treatment for MRSA infections. Performed at [facility], [address], [place], [place] [address]     Coagulation Studies: No  results for input(s): LABPROT, INR in the last 72 hours.  Urinalysis: No results for input(s): COLORURINE, LABSPEC, PHURINE, GLUCOSEU, HGBUR, BILIRUBINUR, KETONESUR, PROTEINUR, UROBILINOGEN, NITRITE, LEUKOCYTESUR in the last 72 hours.  Invalid input(s): APPERANCEUR    Imaging: Nm Myocar Multi W/spect W/wall Motion / Ef  Result Date: 02/14/2018 Pharmacological myocardial perfusion imaging study with no significant  ischemia Normal wall motion, EF estimated at 35%, decreased ejection fraction likely secondary to GI uptake artifact No EKG changes concerning for ischemia at peak stress or in recovery. EKG at rest with frequent PVCs Echocardiogram confirming ejection fraction greater than 55% Low risk scan Signed, Esmond Plants, MD, Ph.D [facility]     Medications:    . aspirin EC  81 mg Oral Daily  . atorvastatin  80 mg Oral q1800  . enoxaparin (LOVENOX)  injection  40 mg Subcutaneous Q24H  . hydrALAZINE  100 mg Oral BID  . insulin aspart  0-15 Units Subcutaneous TID WC  . insulin aspart  0-5 Units Subcutaneous QHS  . insulin aspart  5 Units Subcutaneous TID WC  . insulin detemir  18 Units Subcutaneous Daily  . insulin starter kit- pen needles  1 kit Other Once  . metoprolol tartrate  25 mg Oral BID     Assessment/ Plan:  [name] is a 58 y.o. black male hypertension, hypertensive retinopathy, BPH, obstructive sleep apnea, diet controlled diabetes, gout, who is admitted to [facility] on 02/12/2018   1. Acute renal failure on chronic kidney disease stage III: baseline creatinine of 1.79, GFR of 48. Last seen by nephrology in 05/2016, Dr. Holley Raring.  2. Hyperkalemia 3. Hyponatremia 4. Metabolic acidosis 5. Diabetes mellitus type II with chronic kidney disease: no history of proteinuria. Hemoglobin A1c of 10.4%.  6. Hypertension 7. Diastolic congestive heart failure.   Plan Recommend restarting lisinopril when stable.  Outpatient follow up with Nephrology   LOS: 3 Anthony Sellers 1/24/202010:53 AM

## 2018-02-15 NOTE — Progress Notes (Signed)
Pt provided with script for glucometer, strips and lancets. Pt declined wheelchair. Pt ambulated with family to visitor entrance. Pt denies further needs.

## 2018-02-15 NOTE — Progress Notes (Signed)
Nsg Discharge Note  Admit Date:  02/12/2018 Discharge date: 02/15/2018   Anthony MAULDEN Sr. to be D/C'd Home per MD order.  AVS completed.  Copy for chart, and copy for patient signed, and dated. Patient/caregiver able to verbalize understanding.  Discharge Medication: Allergies as of 02/15/2018   No Known Allergies     Medication List    STOP taking these medications   chlorthalidone 25 MG tablet Commonly known as:  HYGROTON   lisinopril 40 MG tablet Commonly known as:  PRINIVIL,ZESTRIL   spironolactone 25 MG tablet Commonly known as:  ALDACTONE     TAKE these medications   allopurinol 100 MG tablet Commonly known as:  ZYLOPRIM Take 0.5 tablets (50 mg total) by mouth daily. What changed:  how much to take   aspirin 81 MG EC tablet Take 1 tablet (81 mg total) by mouth daily. Start taking on:  February 16, 2018   atorvastatin 80 MG tablet Commonly known as:  LIPITOR Take 1 tablet (80 mg total) by mouth daily at 6 PM.   hydrALAZINE 100 MG tablet Commonly known as:  APRESOLINE Take 1 tablet (100 mg total) by mouth 2 (two) times daily.   insulin aspart 100 UNIT/ML injection Commonly known as:  novoLOG Inject 5 Units into the skin 3 (three) times daily with meals for 30 days.   insulin detemir 100 UNIT/ML injection Commonly known as:  LEVEMIR Inject 0.2 mLs (20 Units total) into the skin daily for 30 days. Start taking on:  February 16, 2018   insulin starter kit- pen needles Misc 1 kit by Other route once for 1 dose.   metoprolol tartrate 25 MG tablet Commonly known as:  LOPRESSOR Take 1 tablet (25 mg total) by mouth 2 (two) times daily.       Discharge Assessment: Vitals:   02/14/18 2334 02/15/18 0806  BP: (!) 143/102 132/90  Pulse: 78 77  Resp: 18 18  Temp: 97.7 F (36.5 C) (!) 97.5 F (36.4 C)  SpO2: 100% 99%   Skin clean, dry and intact without evidence of skin break down, no evidence of skin tears noted. IV catheter discontinued intact. Site  without signs and symptoms of complications - no redness or edema noted at insertion site, patient denies c/o pain - only slight tenderness at site.  Dressing with slight pressure applied.  D/c Instructions-Education: Discharge instructions given to patient/family with verbalized understanding. D/c education completed with patient/family including follow up instructions, medication list, d/c activities limitations if indicated, with other d/c instructions as indicated by MD - patient able to verbalize understanding, all questions fully answered. Patient instructed to return to ED, call 911, or call MD for any changes in condition.  Patient escorted via [place], and D/C home via private auto.  Eda Keys, RN 02/15/2018 11:37 AM

## 2018-02-15 NOTE — Discharge Summary (Signed)
[facility] [place] NAME: Anthony Sellers    MR#:  [id number]  DATE OF BIRTH:  10/31/1960  DATE OF ADMISSION:  02/12/2018   ADMITTING PHYSICIAN: Otila Back, MD  DATE OF DISCHARGE: 02/15/2018.  PRIMARY CARE PHYSICIAN: Park Liter P, DO   ADMISSION DIAGNOSIS:  Hyperkalemia [E87.5] Hyponatremia [E87.1] Acute renal insufficiency [N28.9] NSTEMI (non-ST elevated myocardial infarction) ([place]) [I21.4] Diabetic ketoacidosis without coma associated with other specified diabetes mellitus ([place]) [E13.10] DKA (diabetic ketoacidoses) ([place]) [E11.10] DISCHARGE DIAGNOSIS:  Active Problems:   DKA (diabetic ketoacidoses) ([place])   Demand ischemia ([place])   Acute renal failure superimposed on stage 3 chronic kidney disease ([place])  SECONDARY DIAGNOSIS:   Past Medical History:  Diagnosis Date  . Diabetes mellitus without complication ([facility] [place])   . Enlarged prostate   . Gout   . Hyperlipidemia   . Hypertension   . IFG (impaired fasting glucose)   . Vitamin D deficiency    HOSPITAL COURSE:  Chief complaint; chest pain and elevated blood sugars  Philipp Callegari  is a 58 y.o. male with a known history of hypertension, borderline diabetes mellitus, chronic kidney disease stage III and hyperlipidemia who was treated recently with 12-day course of prednisone taper for gout flareup who presented to the emergency room with complaints of increased frequency of urination and polyuria.  Patient also reported having some intermittent chest pain.  Denied any nausea or vomiting.  No abdominal pains.  No diarrhea.  Patient saw his primary care physician yesterday who checked blood work and patient was called this morning that his blood sugar was greater than 900 and advised to go to the hospital to be checked out.  Patient denied any fevers.  Work-up done in the emergency room significant for hyperglycemia with blood sugar of 1177 with anion gap mildly elevated at 14.  Troponin  level was elevated at 0.77.  Emergency room provider discussed case with cardiologist on-call Dr. Saunders Revel who recommended admitting patient and managing the hyperglycemia and he will see patient in consultation.  Patient remains chest pain-free.  Was started on heparin drip and insulin drip and admitted to the ICU.  Please refer to the H&P dictated for further details.    HOSPITAL COURSE  1.Diabetic ketoacidosis with newly diagnosed diabetes mellitus. Patient was initially admitted to ICU and started on insulin drip following DKA protocol. Anion gap already corrected.  Patient weaned off insulin drip last night.  Glycosylated hemoglobin level of 10.8 suggestive of poor blood sugar control over the last few months.  Diabetic education was done.  Patient was started on insulin regimen with Levemir insulin as well as pre-meal insulin.  Being discharged on same with Levemir insulin 20 units subcu nightly and pre-meal NovoLog insulin at 5 units.  Patient was able to demonstrate self administration of insulin. Patient claimed to have borderline diabetes mellitus prior to admission.  Elevated glycosylated hemoglobin level however suggest poorly controlled blood sugars for the last few months.  2.    Chest pain with elevated troponin in the setting of DKA Noted for elevation of troponin to 2.17.    Likely due to supply demand ischemia.  Non-ST MI elevation ruled out.Patient already seen by cardiologist.   Patient was treated with heparin drip for 48 hours.   Subsequently had a stress test done which was negative for ischemia.  Heparin drip already discontinued.  Patient remains chest pain-free.  No shortness of breath. Cardiologist suspects troponin elevation is  likely from demand ischemia in the setting of moderate LVH and severe hypertension on arrival.  Troponin level trended down.  Continue aspirin, metoprolol, atorvastatin. 2D echocardiogram done with normal ejection fraction with significant  LVH.  3.Hyponatremia with sodium of 122 This is secondary to pseudohyponatremia from hyperglycemia with blood sugar greater than 1000.  Sodium level improved with correction of hyperglycemia.  Most recent sodium level of 133.  4.Mild hyperkalemia with potassium of 5.8 Resolved  5.Acute kidney injury Superimposed on chronic kidney disease stage III Already placed on IV fluids. Likely secondary to dehydration Nephrology service following.  Appreciate input.Holding off on home dose of spironolactone and ACE inhibitor as well as chlorthalidone. Renal function improving with creatinine down to 1.54  Disposition; patient clinically and hemodynamically stable for discharge.  Patient ambulating around the medical floor several times with no symptoms.  DISCHARGE CONDITIONS:  Stable CONSULTS OBTAINED:  Treatment Team:  Lavonia Dana, MD DRUG ALLERGIES:  No Known Allergies DISCHARGE MEDICATIONS:   Allergies as of 02/15/2018   No Known Allergies     Medication List    STOP taking these medications   chlorthalidone 25 MG tablet Commonly known as:  HYGROTON   lisinopril 40 MG tablet Commonly known as:  PRINIVIL,ZESTRIL   spironolactone 25 MG tablet Commonly known as:  ALDACTONE     TAKE these medications   allopurinol 100 MG tablet Commonly known as:  ZYLOPRIM Take 0.5 tablets (50 mg total) by mouth daily. What changed:  how much to take   aspirin 81 MG EC tablet Take 1 tablet (81 mg total) by mouth daily. Start taking on:  February 16, 2018   atorvastatin 80 MG tablet Commonly known as:  LIPITOR Take 1 tablet (80 mg total) by mouth daily at 6 PM.   hydrALAZINE 100 MG tablet Commonly known as:  APRESOLINE Take 1 tablet (100 mg total) by mouth 2 (two) times daily.   insulin aspart 100 UNIT/ML injection Commonly known as:  novoLOG Inject 5 Units into the skin 3 (three) times daily with meals for 30 days.   insulin detemir 100 UNIT/ML injection Commonly  known as:  LEVEMIR Inject 0.2 mLs (20 Units total) into the skin daily for 30 days. Start taking on:  February 16, 2018   insulin starter kit- pen needles Misc 1 kit by Other route once for 1 dose.   metoprolol tartrate 25 MG tablet Commonly known as:  LOPRESSOR Take 1 tablet (25 mg total) by mouth 2 (two) times daily.        DISCHARGE INSTRUCTIONS:   DIET:  Cardiac diet and Diabetic diet DISCHARGE CONDITION:  Stable ACTIVITY:  Activity as tolerated OXYGEN:  Home Oxygen: No.  Oxygen Delivery: room air DISCHARGE LOCATION:  home   If you experience worsening of your admission symptoms, develop shortness of breath, life threatening emergency, suicidal or homicidal thoughts you must seek medical attention immediately by calling 911 or calling your MD immediately  if symptoms less severe.  You Must read complete instructions/literature along with all the possible adverse reactions/side effects for all the Medicines you take and that have been prescribed to you. Take any new Medicines after you have completely understood and accpet all the possible adverse reactions/side effects.   Please note  You were cared for by a hospitalist during your hospital stay. If you have any questions about your discharge medications or the care you received while you were in the hospital after you are discharged, you can call the unit and  asked to speak with the hospitalist on call if the hospitalist that took care of you is not available. Once you are discharged, your primary care physician will handle any further medical issues. Please note that NO REFILLS for any discharge medications will be authorized once you are discharged, as it is imperative that you return to your primary care physician (or establish a relationship with a primary care physician if you do not have one) for your aftercare needs so that they can reassess your need for medications and monitor your lab values.    On the day of  Discharge:  VITAL SIGNS:  Blood pressure 132/90, pulse 77, temperature (!) 97.5 F (36.4 C), temperature source Oral, resp. rate 18, height 5' 6"  (1.676 m), weight 93.9 kg, SpO2 99 %. PHYSICAL EXAMINATION:  GENERAL:  58 y.o.-year-old patient lying in the bed with no acute distress.  EYES: Pupils equal, round, reactive to light and accommodation. No scleral icterus. Extraocular muscles intact.  HEENT: Head atraumatic, normocephalic. Oropharynx and nasopharynx clear.  NECK:  Supple, no jugular venous distention. No thyroid enlargement, no tenderness.  LUNGS: Normal breath sounds bilaterally, no wheezing, rales,rhonchi or crepitation. No use of accessory muscles of respiration.  CARDIOVASCULAR: S1, S2 normal. No murmurs, rubs, or gallops.  ABDOMEN: Soft, non-tender, non-distended. Bowel sounds present. No organomegaly or mass.  EXTREMITIES: No pedal edema, cyanosis, or clubbing.  NEUROLOGIC: Cranial nerves II through XII are intact. Muscle strength 5/5 in all extremities. Sensation intact. Gait not checked.  PSYCHIATRIC: The patient is alert and oriented x 3.  SKIN: No obvious rash, lesion, or ulcer.  DATA REVIEW:   CBC Recent Labs  Lab 02/15/18 0500  WBC 6.0  HGB 12.1*  HCT 37.1*  PLT 153    Chemistries  Recent Labs  Lab 02/12/18 0739  02/15/18 0500  NA 122*   < > 133*  K 5.8*   < > 4.0  CL 87*   < > 104  CO2 21*   < > 23  GLUCOSE 1,177*   < > 237*  BUN 65*   < > 37*  CREATININE 2.88*   < > 1.54*  CALCIUM 9.7   < > 8.4*  MG  --    < > 1.8  AST 23  --   --   ALT 23  --   --   ALKPHOS 79  --   --   BILITOT 1.7*  --   --    < > = values in this interval not displayed.     Microbiology Results  Results for orders placed or performed during the hospital encounter of 02/12/18  MRSA PCR Screening     Status: None   Collection Time: 02/12/18  2:13 PM  Result Value Ref Range Status   MRSA by PCR NEGATIVE NEGATIVE Final    Comment:        The GeneXpert MRSA Assay  (FDA approved for NASAL specimens only), is one component of a comprehensive MRSA colonization surveillance program. It is not intended to diagnose MRSA infection nor to guide or monitor treatment for MRSA infections. Performed at [facility], [address] [place],  [address]     RADIOLOGY:  Nm Myocar Multi W/spect Tamela Oddi Motion / Ef  Result Date: 02/14/2018 Pharmacological myocardial perfusion imaging study with no significant  ischemia Normal wall motion, EF estimated at 35%, decreased ejection fraction likely secondary to GI uptake artifact No EKG changes concerning for ischemia at peak stress or  in recovery. EKG at rest with frequent PVCs Echocardiogram confirming ejection fraction greater than 55% Low risk scan Signed, Esmond Plants, MD, Ph.D [facility]     Management plans discussed with the patient, family and they are in agreement.  CODE STATUS: Prior   TOTAL TIME TAKING CARE OF THIS PATIENT: 39 minutes.    Aubryn Spinola M.D on 02/15/2018 at 11:06 AM  Between 7am to 6pm - Pager - [phone]  After 6pm go to [email or web address] - Proofreader  [facility]  Office  [phone]  CC: Primary care physician; Valerie Roys, DO   Note: This dictation was prepared with Dragon dictation along with smaller phrase technology. Any transcriptional errors that result from this process are unintentional.

## 2018-02-16 ENCOUNTER — Other Ambulatory Visit: Payer: Self-pay | Admitting: Internal Medicine

## 2018-02-16 DIAGNOSIS — E1110 Type 2 diabetes mellitus with ketoacidosis without coma: Secondary | ICD-10-CM

## 2018-02-16 MED ORDER — ACCU-CHEK GUIDE W/DEVICE KIT: 1.0000 | PACK | Freq: Four times a day (QID) | 0 refills | Status: AC

## 2018-02-16 MED ORDER — ACCU-CHEK FASTCLIX LANCETS MISC: 1.0000 | Freq: Four times a day (QID) | 0 refills | Status: DC

## 2018-02-16 MED ORDER — INSULIN LISPRO (1 UNIT DIAL) 100 UNIT/ML (KWIKPEN): 5.0000 [IU] | PEN_INJECTOR | Freq: Three times a day (TID) | SUBCUTANEOUS | 0 refills | Status: DC

## 2018-02-18 ENCOUNTER — Ambulatory Visit (INDEPENDENT_AMBULATORY_CARE_PROVIDER_SITE_OTHER): Payer: 59 | Admitting: Nurse Practitioner

## 2018-02-18 ENCOUNTER — Ambulatory Visit: Payer: 59 | Admitting: *Deleted

## 2018-02-18 ENCOUNTER — Encounter: Payer: Self-pay | Admitting: Nurse Practitioner

## 2018-02-18 ENCOUNTER — Other Ambulatory Visit: Payer: Self-pay

## 2018-02-18 ENCOUNTER — Ambulatory Visit: Payer: Self-pay | Admitting: *Deleted

## 2018-02-18 ENCOUNTER — Telehealth: Payer: Self-pay

## 2018-02-18 VITALS — BP 132/83 | HR 86 | Temp 98.2°F | Ht 70.0 in | Wt 211.0 lb

## 2018-02-18 DIAGNOSIS — E1121 Type 2 diabetes mellitus with diabetic nephropathy: Secondary | ICD-10-CM

## 2018-02-18 DIAGNOSIS — N183 Chronic kidney disease, stage 3 unspecified: Secondary | ICD-10-CM

## 2018-02-18 DIAGNOSIS — M10371 Gout due to renal impairment, right ankle and foot: Secondary | ICD-10-CM

## 2018-02-18 DIAGNOSIS — I1310 Hypertensive heart and chronic kidney disease without heart failure, with stage 1 through stage 4 chronic kidney disease, or unspecified chronic kidney disease: Secondary | ICD-10-CM

## 2018-02-18 MED ORDER — ALLOPURINOL 100 MG PO TABS: 50.0000 mg | ORAL_TABLET | Freq: Every day | ORAL | 5 refills | Status: DC

## 2018-02-18 MED ORDER — MELOXICAM 15 MG PO TABS: 15.0000 mg | ORAL_TABLET | Freq: Every day | ORAL | 0 refills | Status: DC

## 2018-02-18 NOTE — Telephone Encounter (Signed)
Patient has been running elevated glucose levels since discharge from the hospital- he has been recently treated gout with steroids he has finished that treatment. Patient was in the hospital to try to get his glucose under control.  Wife reports he was doing somewhat better- but with the gout flare it went up- now he is the steriod and he is still up. Patient finished the gout treatment 02/09/18.Patient was seen in office 02/11/18- and admitted for elevated glucose- he has been out of hospital 3 days- he has appointment in the office tomorrow- gout is flaring again. Patient is not on any medication for that. Patient has eaten breakfast and glucose is 274- he is going to use Kwikpen dosing . Call to office- appointment made at office today.    Reason for Disposition . [1] Blood glucose 240 - 300 mg/dL (13.3 - 16.7 mmol/L) AND [2] uses insulin (e.g., insulin-dependent, all people with type 1 diabetes)    Call to office- appointment for tomorrow moved to today.  Answer Assessment - Initial Assessment Questions 1. BLOOD GLUCOSE: "What is your blood glucose level?"      284 2. ONSET: "When did you check the blood glucose?"     8:00 3. USUAL RANGE: "What is your glucose level usually?" (e.g., usual fasting morning value, usual evening value)     200's since he has been home- patient was on gout treatment- but finished- gout is back 4. KETONES: "Do you check for ketones (urine or blood test strips)?" If yes, ask: "What does the test show now?"      n/a 5. TYPE 1 or 2:  "Do you know what type of diabetes you have?"  (e.g., Type 1, Type 2, Gestational; doesn't know)      Type 2 6. INSULIN: "Do you take insulin?" "What type of insulin(s) do you use? What is the mode of delivery? (syringe, pen (e.g., injection or  pump)?"      Insulins- patient took insulin this morning after checking it 7. DIABETES PILLS: "Do you take any pills for your diabetes?" If yes, ask: "Have you missed taking any pills  recently?"     No oral pills 8. OTHER SYMPTOMS: "Do you have any symptoms?" (e.g., fever, frequent urination, difficulty breathing, dizziness, weakness, vomiting)     no 9. PREGNANCY: "Is there any chance you are pregnant?" "When was your last menstrual period?"     n/a  Protocols used: DIABETES - HIGH BLOOD SUGAR-A-AH

## 2018-02-18 NOTE — Assessment & Plan Note (Signed)
New diagnosis with January A1C 10.8.  Blood sugar in house today 187.  Continue current insulin regimen.  Has diabetic education on Thursday.  Continue focus on diet and weight loss.  Follow-up in February scheduled.  Consider addition of Ozempic or oral regimen with renal dosing and reduction in insulin at upcoming visits.  BMP today.

## 2018-02-18 NOTE — Patient Instructions (Signed)
Carbohydrate Counting for Diabetes Mellitus, Adult  Carbohydrate counting is a method of keeping track of how many carbohydrates you eat. Eating carbohydrates naturally increases the amount of sugar (glucose) in the blood. Counting how many carbohydrates you eat helps keep your blood glucose within normal limits, which helps you manage your diabetes (diabetes mellitus). It is important to know how many carbohydrates you can safely have in each meal. This is different for every person. A diet and nutrition specialist (registered dietitian) can help you make a meal plan and calculate how many carbohydrates you should have at each meal and snack. Carbohydrates are found in the following foods:  Grains, such as breads and cereals.  Dried beans and soy products.  Starchy vegetables, such as potatoes, peas, and corn.  Fruit and fruit juices.  Milk and yogurt.  Sweets and snack foods, such as cake, cookies, candy, chips, and soft drinks. How do I count carbohydrates? There are two ways to count carbohydrates in food. You can use either of the methods or a combination of both. Reading "Nutrition Facts" on packaged food The "Nutrition Facts" list is included on the labels of almost all packaged foods and beverages in the [place] It includes:  The serving size.  Information about nutrients in each serving, including the grams (g) of carbohydrate per serving. To use the "Nutrition Facts":  Decide how many servings you will have.  Multiply the number of servings by the number of carbohydrates per serving.  The resulting number is the total amount of carbohydrates that you will be having. Learning standard serving sizes of other foods When you eat carbohydrate foods that are not packaged or do not include "Nutrition Facts" on the label, you need to measure the servings in order to count the amount of carbohydrates:  Measure the foods that you will eat with a food scale or measuring cup, if needed.   Decide how many standard-size servings you will eat.  Multiply the number of servings by 15. Most carbohydrate-rich foods have about 15 g of carbohydrates per serving. ? For example, if you eat 8 oz (170 g) of strawberries, you will have eaten 2 servings and 30 g of carbohydrates (2 servings x 15 g = 30 g).  For foods that have more than one food mixed, such as soups and casseroles, you must count the carbohydrates in each food that is included. The following list contains standard serving sizes of common carbohydrate-rich foods. Each of these servings has about 15 g of carbohydrates:   hamburger bun or  English muffin.   oz (15 mL) syrup.   oz (14 g) jelly.  1 slice of bread.  1 six-inch tortilla.  3 oz (85 g) cooked rice or pasta.  4 oz (113 g) cooked dried beans.  4 oz (113 g) starchy vegetable, such as peas, corn, or potatoes.  4 oz (113 g) hot cereal.  4 oz (113 g) mashed potatoes or  of a large baked potato.  4 oz (113 g) canned or frozen fruit.  4 oz (120 mL) fruit juice.  4-6 crackers.  6 chicken nuggets.  6 oz (170 g) unsweetened dry cereal.  6 oz (170 g) plain fat-free yogurt or yogurt sweetened with artificial sweeteners.  8 oz (240 mL) milk.  8 oz (170 g) fresh fruit or one small piece of fruit.  24 oz (680 g) popped popcorn. Example of carbohydrate counting Sample meal  3 oz (85 g) chicken breast.  6 oz (170 g)   brown rice.  4 oz (113 g) corn.  8 oz (240 mL) milk.  8 oz (170 g) strawberries with sugar-free whipped topping. Carbohydrate calculation 1. Identify the foods that contain carbohydrates: ? Rice. ? Corn. ? Milk. ? Strawberries. 2. Calculate how many servings you have of each food: ? 2 servings rice. ? 1 serving corn. ? 1 serving milk. ? 1 serving strawberries. 3. Multiply each number of servings by 15 g: ? 2 servings rice x 15 g = 30 g. ? 1 serving corn x 15 g = 15 g. ? 1 serving milk x 15 g = 15 g. ? 1 serving  strawberries x 15 g = 15 g. 4. Add together all of the amounts to find the total grams of carbohydrates eaten: ? 30 g + 15 g + 15 g + 15 g = 75 g of carbohydrates total. Summary  Carbohydrate counting is a method of keeping track of how many carbohydrates you eat.  Eating carbohydrates naturally increases the amount of sugar (glucose) in the blood.  Counting how many carbohydrates you eat helps keep your blood glucose within normal limits, which helps you manage your diabetes.  A diet and nutrition specialist (registered dietitian) can help you make a meal plan and calculate how many carbohydrates you should have at each meal and snack. This information is not intended to replace advice given to you by your health care provider. Make sure you discuss any questions you have with your health care provider. Document Released: 01/09/2005 Document Revised: 07/19/2016 Document Reviewed: 06/23/2015 Elsevier Interactive Patient Education  2019 [employer] Inc.  

## 2018-02-18 NOTE — Assessment & Plan Note (Signed)
Has not been taking Allopurinol as instructed, never picked up.  Uric acid level today.  Script for Allopurinol 50 MG daily resent.  Meloxicam 15 MG daily x 7 days (current CrCl 62.2).  Avoid Prednisone at this time.  Return for worsening symptoms.

## 2018-02-18 NOTE — Assessment & Plan Note (Addendum)
Chronic, ongoing with BP 132/83 today (improved from previous visits).  Continue current BP medication regimen and consider adding on low dose Lisinopril for kidney protection at upcoming visits if labs continue to be stable post-hospital.  CBC and BMP today.

## 2018-02-18 NOTE — Telephone Encounter (Signed)
Called pt to completed TCM and wife answered and declined answering questions due to pt coming in this afternoon for a f/u. She said pt is not doing well and the office is already aware.  -MM

## 2018-02-18 NOTE — Progress Notes (Signed)
BP 132/83   Pulse 86 Comment: apical  Temp 98.2 F (36.8 C) (Oral)   Ht 5\' 10"  (1.778 m)   Wt 211 lb (95.7 kg)   SpO2 96%   BMI 30.28 kg/m    Subjective:    Patient ID: Anthony Ouch., male    DOB: 10/08/1960, 58 y.o.   MRN: [id number]  HPI: Anthony Sellers. is a 58 y.o. male presents for visit  Chief Complaint  Patient presents with  . Hospitalization Follow-up    pt went to the hospital with high blood sugar  . Gout    since yesterday/ right foot   HOSPITAL FOLLOW UP Was in hospital from 02/12/2018 to 02/15/2018 for diabetic ketoacidosis with new diagnosis of T2DM and A1C in hospital 10.8.  He was discharge on insulin regimen.  Also noted in hospital to have elevated troponin, which was felt to be due to demand ischemia.  Treated with Heparin drip for 48 hours.  2D echo noted normal EF with significant LVH.  Was also noted in hospital to have hyponatremia with discharge NA+ 133, denies symptoms today.  Acute kidney injury and hyperkalemia present in hospital, has improved at discharge with K+ 4.0  And CRT trend downwards to 1.54/GFR 58. Time since discharge: 3 days Hospital/facility: [facility] Diagnosis: Diabetic ketoacidosis, AKI, hyponatremia, hyperkalemia Procedures/tests: 2D echo and labs Consultants: cardiology New medications: Levemir and Novolog Discharge instructions:  His Chlorthalidone, Lisinopril, and Spironolactone were discontinued in hospital.  Hydralazine was decreased to BID.  Levemir 20 units at night and Novolog 5 units before meals were started.   Status: stable, improved at this time  DIABETES His A1C in hospital was 10.8, January 2020.  Since leaving hospital FSBS have been in 150-342 range, they have kept on meter but have not written down to assess times for each sugar reading.  Has scheduled diabetes education on Thursday.  Have encouraged them to make a chart at home and document specific blood sugar readings (morning fasting and before each meal),  then bring these to next appointment.  BS in office today 187 per lab and their glucometer showed 228 (discussed showing to diabetic educator to ensure calibration of meter).  Currently is working on diet changes, has cut out soda and sweets.  Eating more protein and vegetables.  Reports improvement in polyuria and polydipsia. Hypoglycemic episodes:no Polydipsia/polyuria: no Visual disturbance: no Chest pain: no Paresthesias: no Glucose Monitoring: yes  Accucheck frequency: TID  Fasting glucose:  Post prandial:  Evening:  Before meals: Taking Insulin?: yes  Long acting insulin: Levemir 20 units  Short acting insulin: Novolog 5 units pre meal Blood Pressure Monitoring: not checking Retinal Examination: Not up to Date Foot Exam: Not Up To Date, next visit will obtain d/t foot discomfort today Diabetic Education: on Thursday Pneumovax: Not up to Date Influenza: Up to Date Aspirin: yes   GOUT Has not been taking Allopurinol, states they never got it at drugstore last visit.  Will reorder and have discussed with them need to initiate this due to past elevated uric acid levels.  Last uric acid 10.0. Duration:months Right 1st metatarsophalangeal pain: yes Left 1st metatarsophalangeal pain: no Right knee pain: no Left knee pain: no Severity: 9/10  Quality: burning and shooting Swelling: yes Redness: yes Trauma: no Recent dietary change or indiscretion: yes Fevers: no Nausea/vomiting: no Aggravating factors: Alleviating factors:  Status:  better, worse, stable and fluctuating Treatments attempted: Prednisone  Relevant past medical, surgical, family and social  history reviewed and updated as indicated. Interim medical history since our last visit reviewed. Allergies and medications reviewed and updated.  Review of Systems  Constitutional: Negative for activity change, diaphoresis, fatigue and fever.  Respiratory: Negative for cough, chest tightness, shortness of breath and  wheezing.   Cardiovascular: Negative for chest pain, palpitations and leg swelling.  Gastrointestinal: Negative for abdominal distention, abdominal pain, constipation, diarrhea, nausea and vomiting.  Endocrine: Negative for cold intolerance, heat intolerance, polydipsia, polyphagia and polyuria.  Musculoskeletal: Positive for joint swelling.  Skin: Negative.   Neurological: Negative for dizziness, syncope, weakness, light-headedness, numbness and headaches.  Psychiatric/Behavioral: Negative.     Per HPI unless specifically indicated above     Objective:    BP 132/83   Pulse 86 Comment: apical  Temp 98.2 F (36.8 C) (Oral)   Ht 5\' 10"  (1.778 m)   Wt 211 lb (95.7 kg)   SpO2 96%   BMI 30.28 kg/m   Wt Readings from Last 3 Encounters:  02/18/18 211 lb (95.7 kg)  02/12/18 207 lb (93.9 kg)  02/11/18 206 lb (93.4 kg)    Physical Exam Vitals signs and nursing note reviewed.  Constitutional:      Appearance: He is well-developed.  HENT:     Head: Normocephalic and atraumatic.     Right Ear: Hearing normal. No drainage.     Left Ear: Hearing normal. No drainage.     Mouth/Throat:     Pharynx: Uvula midline.  Eyes:     General: Lids are normal.        Right eye: No discharge.        Left eye: No discharge.     Conjunctiva/sclera: Conjunctivae normal.     Pupils: Pupils are equal, round, and reactive to light.  Neck:     Musculoskeletal: Normal range of motion and neck supple.     Thyroid: No thyromegaly.     Vascular: No carotid bruit or JVD.     Trachea: Trachea normal.  Cardiovascular:     Rate and Rhythm: Normal rate and regular rhythm.     Heart sounds: Normal heart sounds, S1 normal and S2 normal. No murmur. No gallop.   Pulmonary:     Effort: Pulmonary effort is normal.     Breath sounds: Normal breath sounds.  Abdominal:     General: Bowel sounds are normal.     Palpations: Abdomen is soft. There is no hepatomegaly or splenomegaly.  Musculoskeletal: Normal range  of motion.     Right lower leg: No edema.     Left lower leg: No edema.       Feet:  Skin:    General: Skin is warm and dry.     Capillary Refill: Capillary refill takes less than 2 seconds.     Findings: No rash.  Neurological:     Mental Status: He is alert and oriented to person, place, and time.     Deep Tendon Reflexes: Reflexes are normal and symmetric.  Psychiatric:        Mood and Affect: Mood normal.        Behavior: Behavior normal.        Thought Content: Thought content normal.        Judgment: Judgment normal.   Estimated Creatinine Clearance: 62.2 mL/min (A) (by C-G formula based on SCr of 1.54 mg/dL (H)).  Results for orders placed or performed during the hospital encounter of 02/12/18  MRSA PCR Screening  Result Value Ref Range  MRSA by PCR NEGATIVE NEGATIVE  NM Myocar Multi W/Spect W/Wall Motion / EF  Result Value Ref Range   Rest HR 81 bpm   Rest BP 139/98 mmHg   Exercise duration (sec) 0 sec   Percent HR 66 %   Exercise duration (min) 0 min   Estimated workload 1.0 METS   Peak HR 109 bpm   Peak BP 127/79 mmHg   MPHR 164 bpm   SSS 0    SRS 0    SDS 0    TID 0.99    LV sys vol 70 mL   LV dias vol 116 62 - 150 mL  Glucose, capillary  Result Value Ref Range   Glucose-Capillary >600 (HH) 70 - 99 mg/dL  Basic metabolic panel  Result Value Ref Range   Sodium 122 (L) 135 - 145 mmol/L   Potassium 5.8 (H) 3.5 - 5.1 mmol/L   Chloride 87 (L) 98 - 111 mmol/L   CO2 21 (L) 22 - 32 mmol/L   Glucose, Bld 1,177 (HH) 70 - 99 mg/dL   BUN 65 (H) 6 - 20 mg/dL   Creatinine, Ser 2.88 (H) 0.61 - 1.24 mg/dL   Calcium 9.7 8.9 - 10.3 mg/dL   GFR calc non Af Amer 23 (L) >60 mL/min   GFR calc Af Amer 27 (L) >60 mL/min   Anion gap 14 5 - 15  CBC  Result Value Ref Range   WBC 14.9 (H) 4.0 - 10.5 K/uL   RBC 5.31 4.22 - 5.81 MIL/uL   Hemoglobin 14.5 13.0 - 17.0 g/dL   HCT 46.8 39.0 - 52.0 %   MCV 88.1 80.0 - 100.0 fL   MCH 27.3 26.0 - 34.0 pg   MCHC 31.0 30.0 - 36.0  g/dL   RDW 13.6 11.5 - 15.5 %   Platelets 238 150 - 400 K/uL   nRBC 0.0 0.0 - 0.2 %  Troponin I - ONCE - STAT  Result Value Ref Range   Troponin I 0.77 (HH) <0.03 ng/mL  Hepatic function panel  Result Value Ref Range   Total Protein 7.5 6.5 - 8.1 g/dL   Albumin 4.1 3.5 - 5.0 g/dL   AST 23 15 - 41 U/L   ALT 23 0 - 44 U/L   Alkaline Phosphatase 79 38 - 126 U/L   Total Bilirubin 1.7 (H) 0.3 - 1.2 mg/dL   Bilirubin, Direct 0.2 0.0 - 0.2 mg/dL   Indirect Bilirubin 1.5 (H) 0.3 - 0.9 mg/dL  Protime-INR  Result Value Ref Range   Prothrombin Time 14.4 11.4 - 15.2 seconds   INR 1.13   APTT  Result Value Ref Range   aPTT 28 24 - 36 seconds  Blood gas, venous  Result Value Ref Range   pH, Ven 7.38 7.250 - 7.430   pCO2, Ven 38 (L) 44.0 - 60.0 mmHg   pO2, Ven 73.0 (H) 32.0 - 45.0 mmHg   Bicarbonate 22.5 20.0 - 28.0 mmol/L   Acid-base deficit 2.3 (H) 0.0 - 2.0 mmol/L   O2 Saturation 94.2 %   Patient temperature 37.0    Collection site VEIN    Sample type VEIN   Urinalysis, Complete w Microscopic  Result Value Ref Range   Color, Urine STRAW (A) YELLOW   APPearance CLEAR (A) CLEAR   Specific Gravity, Urine 1.022 1.005 - 1.030   pH 5.0 5.0 - 8.0   Glucose, UA >=500 (A) NEGATIVE mg/dL   Hgb urine dipstick SMALL (A) NEGATIVE  Bilirubin Urine NEGATIVE NEGATIVE   Ketones, ur NEGATIVE NEGATIVE mg/dL   Protein, ur NEGATIVE NEGATIVE mg/dL   Nitrite NEGATIVE NEGATIVE   Leukocytes, UA NEGATIVE NEGATIVE   RBC / HPF 0-5 0 - 5 RBC/hpf   WBC, UA 0-5 0 - 5 WBC/hpf   Bacteria, UA NONE SEEN NONE SEEN   Squamous Epithelial / LPF NONE SEEN 0 - 5  Basic metabolic panel  Result Value Ref Range   Sodium 132 (L) 135 - 145 mmol/L   Potassium 5.7 (H) 3.5 - 5.1 mmol/L   Chloride 98 98 - 111 mmol/L   CO2 23 22 - 32 mmol/L   Glucose, Bld 777 (HH) 70 - 99 mg/dL   BUN 61 (H) 6 - 20 mg/dL   Creatinine, Ser 2.63 (H) 0.61 - 1.24 mg/dL   Calcium 9.9 8.9 - 10.3 mg/dL   GFR calc non Af Amer 26 (L) >60  mL/min   GFR calc Af Amer 30 (L) >60 mL/min   Anion gap 11 5 - 15  Magnesium  Result Value Ref Range   Magnesium 2.4 1.7 - 2.4 mg/dL  Phosphorus  Result Value Ref Range   Phosphorus 4.2 2.5 - 4.6 mg/dL  Troponin I - Now Then Q6H  Result Value Ref Range   Troponin I 1.64 (HH) <0.03 ng/mL  Troponin I - Now Then Q6H  Result Value Ref Range   Troponin I 2.03 (HH) <0.03 ng/mL  Troponin I - Now Then Q6H  Result Value Ref Range   Troponin I 2.17 (HH) <0.03 ng/mL  Glucose, capillary  Result Value Ref Range   Glucose-Capillary >600 (HH) 70 - 99 mg/dL  Urine Drug Screen, Qualitative ([facility] only)  Result Value Ref Range   Tricyclic, Ur Screen NONE DETECTED NONE DETECTED   Amphetamines, Ur Screen NONE DETECTED NONE DETECTED   MDMA (Ecstasy)Ur Screen NONE DETECTED NONE DETECTED   Cocaine Metabolite,Ur [place] NONE DETECTED NONE DETECTED   Opiate, Ur Screen NONE DETECTED NONE DETECTED   Phencyclidine (PCP) Ur S NONE DETECTED NONE DETECTED   Cannabinoid 50 Ng, Ur [place] NONE DETECTED NONE DETECTED   Barbiturates, Ur Screen NONE DETECTED NONE DETECTED   Benzodiazepine, Ur Scrn NONE DETECTED NONE DETECTED   Methadone Scn, Ur NONE DETECTED NONE DETECTED  Heparin level (unfractionated)  Result Value Ref Range   Heparin Unfractionated 0.83 (H) 0.30 - 0.70 IU/mL  Glucose, capillary  Result Value Ref Range   Glucose-Capillary >600 (HH) 70 - 99 mg/dL  Glucose, capillary  Result Value Ref Range   Glucose-Capillary 558 (HH) 70 - 99 mg/dL   Comment 1 Notify RN   Glucose, capillary  Result Value Ref Range   Glucose-Capillary 464 (H) 70 - 99 mg/dL  Basic metabolic panel  Result Value Ref Range   Sodium 136 135 - 145 mmol/L   Potassium 4.0 3.5 - 5.1 mmol/L   Chloride 106 98 - 111 mmol/L   CO2 23 22 - 32 mmol/L   Glucose, Bld 249 (H) 70 - 99 mg/dL   BUN 53 (H) 6 - 20 mg/dL   Creatinine, Ser 2.43 (H) 0.61 - 1.24 mg/dL   Calcium 9.2 8.9 - 10.3 mg/dL   GFR calc non Af Amer 29 (L) >60 mL/min   GFR calc  Af Amer 33 (L) >60 mL/min   Anion gap 7 5 - 15  Glucose, capillary  Result Value Ref Range   Glucose-Capillary 385 (H) 70 - 99 mg/dL  Glucose, capillary  Result Value Ref Range  Glucose-Capillary 364 (H) 70 - 99 mg/dL  Glucose, capillary  Result Value Ref Range   Glucose-Capillary 293 (H) 70 - 99 mg/dL  Hemoglobin A1c  Result Value Ref Range   Hgb A1c MFr Bld 10.8 (H) 4.8 - 5.6 %   Mean Plasma Glucose 263.26 mg/dL  Lipid panel  Result Value Ref Range   Cholesterol 263 (H) 0 - 200 mg/dL   Triglycerides 199 (H) <150 mg/dL   HDL 42 >40 mg/dL   Total CHOL/HDL Ratio 6.3 RATIO   VLDL 40 0 - 40 mg/dL   LDL Cholesterol 181 (H) 0 - 99 mg/dL  Basic metabolic panel  Result Value Ref Range   Sodium 138 135 - 145 mmol/L   Potassium 4.7 3.5 - 5.1 mmol/L   Chloride 106 98 - 111 mmol/L   CO2 26 22 - 32 mmol/L   Glucose, Bld 221 (H) 70 - 99 mg/dL   BUN 50 (H) 6 - 20 mg/dL   Creatinine, Ser 2.41 (H) 0.61 - 1.24 mg/dL   Calcium 8.7 (L) 8.9 - 10.3 mg/dL   GFR calc non Af Amer 29 (L) >60 mL/min   GFR calc Af Amer 34 (L) >60 mL/min   Anion gap 6 5 - 15  CBC  Result Value Ref Range   WBC 13.8 (H) 4.0 - 10.5 K/uL   RBC 4.77 4.22 - 5.81 MIL/uL   Hemoglobin 13.1 13.0 - 17.0 g/dL   HCT 40.1 39.0 - 52.0 %   MCV 84.1 80.0 - 100.0 fL   MCH 27.5 26.0 - 34.0 pg   MCHC 32.7 30.0 - 36.0 g/dL   RDW 13.2 11.5 - 15.5 %   Platelets 214 150 - 400 K/uL   nRBC 0.0 0.0 - 0.2 %  Magnesium  Result Value Ref Range   Magnesium 1.9 1.7 - 2.4 mg/dL  Phosphorus  Result Value Ref Range   Phosphorus 2.1 (L) 2.5 - 4.6 mg/dL  Glucose, capillary  Result Value Ref Range   Glucose-Capillary 299 (H) 70 - 99 mg/dL  Glucose, capillary  Result Value Ref Range   Glucose-Capillary 220 (H) 70 - 99 mg/dL  Glucose, capillary  Result Value Ref Range   Glucose-Capillary 142 (H) 70 - 99 mg/dL   Comment 1 Notify RN   Glucose, capillary  Result Value Ref Range   Glucose-Capillary 129 (H) 70 - 99 mg/dL   Comment 1  Notify RN   Glucose, capillary  Result Value Ref Range   Glucose-Capillary 127 (H) 70 - 99 mg/dL   Comment 1 Notify RN   Magnesium  Result Value Ref Range   Magnesium 1.9 1.7 - 2.4 mg/dL  Glucose, capillary  Result Value Ref Range   Glucose-Capillary 137 (H) 70 - 99 mg/dL  Glucose, capillary  Result Value Ref Range   Glucose-Capillary 301 (H) 70 - 99 mg/dL   Comment 1 Notify RN   Glucose, capillary  Result Value Ref Range   Glucose-Capillary 208 (H) 70 - 99 mg/dL   Comment 1 Notify RN   Heparin level (unfractionated)  Result Value Ref Range   Heparin Unfractionated 0.88 (H) 0.30 - 0.70 IU/mL  Glucose, capillary  Result Value Ref Range   Glucose-Capillary 169 (H) 70 - 99 mg/dL  Heparin level (unfractionated)  Result Value Ref Range   Heparin Unfractionated 0.59 0.30 - 0.70 IU/mL  Glucose, capillary  Result Value Ref Range   Glucose-Capillary 232 (H) 70 - 99 mg/dL  Troponin I - Once  Result Value Ref  Range   Troponin I 0.74 (HH) <0.03 ng/mL  Troponin I - Add-On to previous collection  Result Value Ref Range   Troponin I 0.58 (HH) <0.03 ng/mL  Basic metabolic panel  Result Value Ref Range   Sodium 134 (L) 135 - 145 mmol/L   Potassium 3.8 3.5 - 5.1 mmol/L   Chloride 105 98 - 111 mmol/L   CO2 24 22 - 32 mmol/L   Glucose, Bld 260 (H) 70 - 99 mg/dL   BUN 41 (H) 6 - 20 mg/dL   Creatinine, Ser 1.76 (H) 0.61 - 1.24 mg/dL   Calcium 8.2 (L) 8.9 - 10.3 mg/dL   GFR calc non Af Amer 42 (L) >60 mL/min   GFR calc Af Amer 49 (L) >60 mL/min   Anion gap 5 5 - 15  CBC  Result Value Ref Range   WBC 8.3 4.0 - 10.5 K/uL   RBC 4.48 4.22 - 5.81 MIL/uL   Hemoglobin 12.3 (L) 13.0 - 17.0 g/dL   HCT 37.6 (L) 39.0 - 52.0 %   MCV 83.9 80.0 - 100.0 fL   MCH 27.5 26.0 - 34.0 pg   MCHC 32.7 30.0 - 36.0 g/dL   RDW 13.2 11.5 - 15.5 %   Platelets 170 150 - 400 K/uL   nRBC 0.0 0.0 - 0.2 %  Magnesium  Result Value Ref Range   Magnesium 1.7 1.7 - 2.4 mg/dL  Phosphorus  Result Value Ref Range    Phosphorus 3.4 2.5 - 4.6 mg/dL  Glucose, capillary  Result Value Ref Range   Glucose-Capillary 171 (H) 70 - 99 mg/dL  Heparin level (unfractionated)  Result Value Ref Range   Heparin Unfractionated 0.54 0.30 - 0.70 IU/mL  Glucose, capillary  Result Value Ref Range   Glucose-Capillary 332 (H) 70 - 99 mg/dL   Comment 1 Notify RN   Heparin level (unfractionated)  Result Value Ref Range   Heparin Unfractionated 0.55 0.30 - 0.70 IU/mL  Glucose, capillary  Result Value Ref Range   Glucose-Capillary 279 (H) 70 - 99 mg/dL   Comment 1 Notify RN   Glucose, capillary  Result Value Ref Range   Glucose-Capillary 226 (H) 70 - 99 mg/dL  Glucose, capillary  Result Value Ref Range   Glucose-Capillary 186 (H) 70 - 99 mg/dL  Glucose, capillary  Result Value Ref Range   Glucose-Capillary 213 (H) 70 - 99 mg/dL  Heparin level (unfractionated)  Result Value Ref Range   Heparin Unfractionated 0.14 (L) 0.30 - 0.70 IU/mL  CBC  Result Value Ref Range   WBC 6.0 4.0 - 10.5 K/uL   RBC 4.44 4.22 - 5.81 MIL/uL   Hemoglobin 12.1 (L) 13.0 - 17.0 g/dL   HCT 37.1 (L) 39.0 - 52.0 %   MCV 83.6 80.0 - 100.0 fL   MCH 27.3 26.0 - 34.0 pg   MCHC 32.6 30.0 - 36.0 g/dL   RDW 12.8 11.5 - 15.5 %   Platelets 153 150 - 400 K/uL   nRBC 0.0 0.0 - 0.2 %  Basic metabolic panel  Result Value Ref Range   Sodium 133 (L) 135 - 145 mmol/L   Potassium 4.0 3.5 - 5.1 mmol/L   Chloride 104 98 - 111 mmol/L   CO2 23 22 - 32 mmol/L   Glucose, Bld 237 (H) 70 - 99 mg/dL   BUN 37 (H) 6 - 20 mg/dL   Creatinine, Ser 1.54 (H) 0.61 - 1.24 mg/dL   Calcium 8.4 (L) 8.9 - 10.3 mg/dL  GFR calc non Af Amer 50 (L) >60 mL/min   GFR calc Af Amer 58 (L) >60 mL/min   Anion gap 6 5 - 15  Magnesium  Result Value Ref Range   Magnesium 1.8 1.7 - 2.4 mg/dL  Glucose, capillary  Result Value Ref Range   Glucose-Capillary 154 (H) 70 - 99 mg/dL  Glucose, capillary  Result Value Ref Range   Glucose-Capillary 216 (H) 70 - 99 mg/dL  Glucose,  capillary  Result Value Ref Range   Glucose-Capillary 189 (H) 70 - 99 mg/dL   Comment 1 Notify RN   ECHOCARDIOGRAM COMPLETE  Result Value Ref Range   Weight 3,312 oz   Height 66 in   BP 151/104 mmHg      Assessment & Plan:   Problem List Items Addressed This Visit      Cardiovascular and Mediastinum   Hypertensive heart/kidney disease without HF and with CKD stage III (HCC)    Chronic, ongoing with BP 132/83 today (improved from previous visits).  Continue current BP medication regimen and consider adding on low dose Lisinopril for kidney protection at upcoming visits if labs continue to be stable post-hospital.  CBC and BMP today.        Endocrine   Type 2 diabetes with nephropathy ([place]) - Primary    New diagnosis with January A1C 10.8.  Blood sugar in house today 187.  Continue current insulin regimen.  Has diabetic education on Thursday.  Continue focus on diet and weight loss.  Follow-up in February scheduled.  Consider addition of Ozempic or oral regimen with renal dosing and reduction in insulin at upcoming visits.  BMP today.      Relevant Orders   Glucose Hemocue Waived   CBC w/Diff   Basic Metabolic Panel (BMET)     Musculoskeletal and Integument   Acute gout due to renal impairment involving toe of right foot    Has not been taking Allopurinol as instructed, never picked up.  Uric acid level today.  Script for Allopurinol 50 MG daily resent.  Meloxicam 15 MG daily x 7 days (current CrCl 62.2).  Avoid Prednisone at this time.  Return for worsening symptoms.      Relevant Medications   allopurinol (ZYLOPRIM) 100 MG tablet   meloxicam (MOBIC) 15 MG tablet   Other Relevant Orders   Uric acid       Follow up plan: Return as scheduled with Dr. Wynetta Emery in February.

## 2018-02-19 ENCOUNTER — Inpatient Hospital Stay: Payer: 59 | Admitting: Family Medicine

## 2018-02-19 LAB — CBC WITH DIFFERENTIAL/PLATELET
BASOS: 0 %
Basophils Absolute: 0 10*3/uL (ref 0.0–0.2)
EOS (ABSOLUTE): 0.2 10*3/uL (ref 0.0–0.4)
Eos: 2 %
Hematocrit: 38.3 % (ref 37.5–51.0)
Hemoglobin: 13.1 g/dL (ref 13.0–17.7)
IMMATURE GRANS (ABS): 0.1 10*3/uL (ref 0.0–0.1)
IMMATURE GRANULOCYTES: 1 %
Lymphocytes Absolute: 1.5 10*3/uL (ref 0.7–3.1)
Lymphs: 14 %
MCH: 28.2 pg (ref 26.6–33.0)
MCHC: 34.2 g/dL (ref 31.5–35.7)
MCV: 82 fL (ref 79–97)
Monocytes Absolute: 0.8 10*3/uL (ref 0.1–0.9)
Monocytes: 8 %
Neutrophils Absolute: 7.5 10*3/uL — ABNORMAL HIGH (ref 1.4–7.0)
Neutrophils: 75 %
Platelets: 209 10*3/uL (ref 150–450)
RBC: 4.65 x10E6/uL (ref 4.14–5.80)
RDW: 13.3 % (ref 11.6–15.4)
WBC: 10.2 10*3/uL (ref 3.4–10.8)

## 2018-02-19 LAB — URIC ACID: Uric Acid: 8.1 mg/dL (ref 3.7–8.6)

## 2018-02-19 LAB — BASIC METABOLIC PANEL
BUN/Creatinine Ratio: 18 (ref 9–20)
BUN: 35 mg/dL — ABNORMAL HIGH (ref 6–24)
CALCIUM: 9.1 mg/dL (ref 8.7–10.2)
CO2: 18 mmol/L — ABNORMAL LOW (ref 20–29)
Chloride: 100 mmol/L (ref 96–106)
Creatinine, Ser: 1.9 mg/dL — ABNORMAL HIGH (ref 0.76–1.27)
GFR calc Af Amer: 45 mL/min/{1.73_m2} — ABNORMAL LOW (ref >59)
GFR calc non Af Amer: 39 mL/min/{1.73_m2} — ABNORMAL LOW (ref >59)
Glucose: 217 mg/dL — ABNORMAL HIGH (ref 65–99)
Potassium: 4.1 mmol/L (ref 3.5–5.2)
Sodium: 134 mmol/L (ref 134–144)

## 2018-02-20 ENCOUNTER — Telehealth: Payer: Self-pay

## 2018-02-20 NOTE — Telephone Encounter (Signed)
EMMI Follow-up: Noted on the report that the patient didn't know who to contact in case there was a change in his condition, had unfilled Rx's and not able to fill.  I called [name] and he was not at home but left my contact information with his wife, Izora Gala for him to call me at his convenience.

## 2018-02-21 ENCOUNTER — Encounter: Payer: Self-pay | Admitting: *Deleted

## 2018-02-21 ENCOUNTER — Encounter: Payer: 59 | Attending: Family Medicine | Admitting: *Deleted

## 2018-02-21 VITALS — BP 150/96 | Ht 66.0 in | Wt 218.4 lb

## 2018-02-21 DIAGNOSIS — E1310 Other specified diabetes mellitus with ketoacidosis without coma: Secondary | ICD-10-CM | POA: Diagnosis not present

## 2018-02-21 DIAGNOSIS — E1165 Type 2 diabetes mellitus with hyperglycemia: Secondary | ICD-10-CM

## 2018-02-21 DIAGNOSIS — Z713 Dietary counseling and surveillance: Secondary | ICD-10-CM | POA: Diagnosis present

## 2018-02-21 DIAGNOSIS — Z6835 Body mass index (BMI) 35.0-35.9, adult: Secondary | ICD-10-CM | POA: Diagnosis not present

## 2018-02-21 NOTE — Progress Notes (Signed)
Diabetes Self-Management Education  Visit Type: First/Initial  Appt. Start Time: 1335 Appt. End Time: 3664  02/21/2018  [name], identified by name and date of birth, is a 58 y.o. male with a diagnosis of Diabetes: Type 2.   ASSESSMENT  Blood pressure (!) 150/96, height 5\' 6"  (1.676 m), weight 218 lb 6.4 oz (99.1 kg). Body mass index is 35.25 kg/m.  Diabetes Self-Management Education - 02/21/18 1544      Visit Information   Visit Type  First/Initial      Initial Visit   Diabetes Type  Type 2    Are you currently following a meal plan?  No    Are you taking your medications as prescribed?  Yes    Date Diagnosed  last week      Health Coping   How would you rate your overall health?  Good      Psychosocial Assessment   Patient Belief/Attitude about Diabetes  Other (comment)   "ok   Self-care barriers  None    Self-management support  Doctor's office;Family    Other persons present  Spouse/SO;Family Member   son   Patient Concerns  Nutrition/Meal planning;Glycemic Control;Weight Control;Healthy Lifestyle    Special Needs  None    Preferred Learning Style  Visual;Auditory    Learning Readiness  Change in progress    How often do you need to have someone help you when you read instructions, pamphlets, or other written materials from your doctor or pharmacy?  2 - Rarely    What is the last grade level you completed in school?  12th      Pre-Education Assessment   Patient understands the diabetes disease and treatment process.  Needs Instruction    Patient understands incorporating nutritional management into lifestyle.  Needs Instruction    Patient undertands incorporating physical activity into lifestyle.  Needs Instruction    Patient understands using medications safely.  Needs Instruction    Patient understands monitoring blood glucose, interpreting and using results  Needs Review    Patient understands prevention, detection, and treatment of acute  complications.  Needs Instruction    Patient understands prevention, detection, and treatment of chronic complications.  Needs Instruction    Patient understands how to develop strategies to address psychosocial issues.  Needs Instruction    Patient understands how to develop strategies to promote health/change behavior.  Needs Instruction      Complications   Last HgB A1C per patient/outside source  10.8 %   02/13/18   How often do you check your blood sugar?  3-4 times/day    Fasting Blood glucose range (mg/dL)  >200   FBG's and before meals 205-310 mg/dL. 7 day average of 246 mg/dL.   Have you had a dilated eye exam in the past 12 months?  No    Have you had a dental exam in the past 12 months?  No    Are you checking your feet?  No      Dietary Intake   Breakfast  eggs and toast    Snack (morning)  pt snacks 4 x day with chips and salsa    Lunch  salad with smoked sausage, mixed vegetables, baked chicken    Dinner  chicken, beef, fish, bread, potatoes, beans, corn, green beans, lettuce, tomatoes, greens, cabbage, broccoli    Beverage(s)  water      Exercise   Exercise Type  ADL's      Patient Education   Previous Diabetes  Education  No    Disease state   Definition of diabetes, type 1 and 2, and the diagnosis of diabetes    Nutrition management   Role of diet in the treatment of diabetes and the relationship between the three main macronutrients and blood glucose level;Reviewed blood glucose goals for pre and post meals and how to evaluate the patients' food intake on their blood glucose level.;Meal timing in regards to the patients' current diabetes medication.    Physical activity and exercise   Role of exercise on diabetes management, blood pressure control and cardiac health.    Medications  Taught/reviewed insulin injection, site rotation, insulin storage and needle disposal.;Reviewed patients medication for diabetes, action, purpose, timing of dose and side effects.     Monitoring  Purpose and frequency of SMBG.;Taught/discussed recording of test results and interpretation of SMBG.;Identified appropriate SMBG and/or A1C goals.;Yearly dilated eye exam    Acute complications  Taught treatment of hypoglycemia - the 15 rule.    Chronic complications  Relationship between chronic complications and blood glucose control    Psychosocial adjustment  Identified and addressed patients feelings and concerns about diabetes      Individualized Goals (developed by patient)   Reducing Risk Improve blood sugars Lose weight Lead a healthier lifestyle Become more fit     Outcomes   Expected Outcomes  Demonstrated interest in learning. Expect positive outcomes    Future DMSE  2 wks       Individualized Plan for Diabetes Self-Management Training:   Learning Objective:  Patient will have a greater understanding of diabetes self-management. Patient education plan is to attend individual and/or group sessions per assessed needs and concerns.   Plan:   Patient Instructions  Check blood sugars 4 x day before each meal and before bed every day Bring blood sugar records to the next class Exercise:  Begin walking  for  15  minutes  3 days a week and gradually increase to 150 minutes/week Nothing strenuous until blood sugars less than 250 Eat 3 meals day,  2-3  snacks a day Space meals 4-6 hours apart Allow 2-3 hours between meals and snacks Make an eye doctor appointment when blood sugars stable Carry fast acting glucose and a snack at all times Rotate injection sites Hold insulin pens in place for 5-10 seconds after injection  Expected Outcomes:  Demonstrated interest in learning. Expect positive outcomes  Education material provided:  General Meal Planning Guidelines Simple Meal Plan Glucose tablets Symptoms, causes and treatments of Hypoglycemia  If problems or questions, patient to contact team via:  [name], [place], [place], CDE [phone]  Future DSME  appointment: 2 wks  March 07, 2018 for Diabetes Class 1

## 2018-02-21 NOTE — Patient Instructions (Signed)
Check blood sugars 4 x day before each meal and before bed every day Bring blood sugar records to the next class  Exercise:  Begin walking  for  15  minutes  3 days a week and gradually increase to 150 minutes/week Nothing strenuous until blood sugars less than 250  Eat 3 meals day,  2-3  snacks a day Space meals 4-6 hours apart Allow 2-3 hours between meals and snacks  Make an eye doctor appointment when blood sugars stable  Carry fast acting glucose and a snack at all times Rotate injection sites Hold insulin pens in place for 5-10 seconds after injection  Return for classes on:

## 2018-02-27 ENCOUNTER — Encounter: Payer: Self-pay | Admitting: Family Medicine

## 2018-02-27 ENCOUNTER — Ambulatory Visit (INDEPENDENT_AMBULATORY_CARE_PROVIDER_SITE_OTHER): Payer: 59 | Admitting: Family Medicine

## 2018-02-27 VITALS — BP 161/98 | HR 80 | Temp 98.1°F | Ht 66.0 in | Wt 222.6 lb

## 2018-02-27 DIAGNOSIS — Z23 Encounter for immunization: Secondary | ICD-10-CM | POA: Diagnosis not present

## 2018-02-27 DIAGNOSIS — M1A30X0 Chronic gout due to renal impairment, unspecified site, without tophus (tophi): Secondary | ICD-10-CM

## 2018-02-27 DIAGNOSIS — M10371 Gout due to renal impairment, right ankle and foot: Secondary | ICD-10-CM

## 2018-02-27 DIAGNOSIS — E1121 Type 2 diabetes mellitus with diabetic nephropathy: Secondary | ICD-10-CM

## 2018-02-27 DIAGNOSIS — N183 Chronic kidney disease, stage 3 unspecified: Secondary | ICD-10-CM

## 2018-02-27 DIAGNOSIS — I1310 Hypertensive heart and chronic kidney disease without heart failure, with stage 1 through stage 4 chronic kidney disease, or unspecified chronic kidney disease: Secondary | ICD-10-CM

## 2018-02-27 NOTE — Assessment & Plan Note (Signed)
He has not been taking his novolog. Sugars have been running no higher than 150 without novolog. Will stop novolog, continue his levemir and continue to monitor. If kidney functions remains good, will transition from insulin to oral for ease of use as he doesn't like checking his sugars.

## 2018-02-27 NOTE — Assessment & Plan Note (Signed)
Did not take his BP medicine this AM. Encouraged him to take his medicine as prescribed. Recheck 1 month.

## 2018-02-27 NOTE — Assessment & Plan Note (Signed)
Has not had any flares. Taking his allopurinol. Rechecking levels today. Await results. Call with any concerns.

## 2018-02-27 NOTE — Assessment & Plan Note (Signed)
Rechecking levels today. Await results. Call with any concerns.  

## 2018-02-27 NOTE — Progress Notes (Signed)
BP (!) 161/98 (BP Location: Left Arm, Patient Position: Sitting, Cuff Size: Large)   Pulse 80   Temp 98.1 F (36.7 C)   Ht 5\' 6"  (1.676 m)   Wt 222 lb 9 oz (101 kg)   SpO2 94%   BMI 35.92 kg/m    Subjective:    Patient ID: Anthony Ouch., male    DOB: 1960/04/18, 58 y.o.   MRN: [id number]  HPI: Bohdan Macho. is a 58 y.o. male  Chief Complaint  Patient presents with  . Gout  . Diabetes    Patient would like a prescription for a new blood glucose monitor, Dexcom   Has not had any gout flares since his last appointment. He has been feeling well. He has been taking his allopurinol. No other concerns at this time.   DIABETES Hypoglycemic episodes:no Polydipsia/polyuria: no Visual disturbance: no Chest pain: no Paresthesias: no Glucose Monitoring: yes  Accucheck frequency: 4x a day  Fasting glucose:91-100  Before meals: 120-150 Taking Insulin?: yes  Short acting insulin: Has not been taking it Blood Pressure Monitoring: not checking Retinal Examination: Not up to Date Foot Exam: Up to Date Diabetic Education: Not Completed Pneumovax: Given today Influenza: Up to Date Aspirin: yes  Did not take his blood pressure medicine this AM  Relevant past medical, surgical, family and social history reviewed and updated as indicated. Interim medical history since our last visit reviewed. Allergies and medications reviewed and updated.  Review of Systems  Constitutional: Negative.   Respiratory: Negative.   Cardiovascular: Negative.   Musculoskeletal: Negative.   Neurological: Negative.   Psychiatric/Behavioral: Negative.     Per HPI unless specifically indicated above     Objective:    BP (!) 161/98 (BP Location: Left Arm, Patient Position: Sitting, Cuff Size: Large)   Pulse 80   Temp 98.1 F (36.7 C)   Ht 5\' 6"  (1.676 m)   Wt 222 lb 9 oz (101 kg)   SpO2 94%   BMI 35.92 kg/m   Wt Readings from Last 3 Encounters:  02/27/18 222 lb 9 oz (101 kg)    02/21/18 218 lb 6.4 oz (99.1 kg)  02/18/18 211 lb (95.7 kg)    Physical Exam Vitals signs and nursing note reviewed.  Constitutional:      General: He is not in acute distress.    Appearance: Normal appearance. He is not ill-appearing, toxic-appearing or diaphoretic.  HENT:     Head: Normocephalic and atraumatic.     Right Ear: External ear normal.     Left Ear: External ear normal.     Nose: Nose normal.     Mouth/Throat:     Mouth: Mucous membranes are moist.     Pharynx: Oropharynx is clear.  Eyes:     General: No scleral icterus.       Right eye: No discharge.        Left eye: No discharge.     Extraocular Movements: Extraocular movements intact.     Conjunctiva/sclera: Conjunctivae normal.     Pupils: Pupils are equal, round, and reactive to light.  Neck:     Musculoskeletal: Normal range of motion and neck supple.  Cardiovascular:     Rate and Rhythm: Normal rate and regular rhythm.     Pulses: Normal pulses.     Heart sounds: Normal heart sounds. No murmur. No friction rub. No gallop.   Pulmonary:     Effort: Pulmonary effort is normal. No respiratory distress.  Breath sounds: Normal breath sounds. No stridor. No wheezing, rhonchi or rales.  Chest:     Chest wall: No tenderness.  Musculoskeletal: Normal range of motion.  Skin:    General: Skin is warm and dry.     Capillary Refill: Capillary refill takes less than 2 seconds.     Coloration: Skin is not jaundiced or pale.     Findings: No bruising, erythema, lesion or rash.  Neurological:     General: No focal deficit present.     Mental Status: He is alert and oriented to person, place, and time. Mental status is at baseline.  Psychiatric:        Mood and Affect: Mood normal.        Behavior: Behavior normal.        Thought Content: Thought content normal.        Judgment: Judgment normal.     Results for orders placed or performed in visit on 02/18/18  CBC w/Diff  Result Value Ref Range   WBC 10.2  3.4 - 10.8 x10E3/uL   RBC 4.65 4.14 - 5.80 x10E6/uL   Hemoglobin 13.1 13.0 - 17.7 g/dL   Hematocrit 38.3 37.5 - 51.0 %   MCV 82 79 - 97 fL   MCH 28.2 26.6 - 33.0 pg   MCHC 34.2 31.5 - 35.7 g/dL   RDW 13.3 11.6 - 15.4 %   Platelets 209 150 - 450 x10E3/uL   Neutrophils 75 Not Estab. %   Lymphs 14 Not Estab. %   Monocytes 8 Not Estab. %   Eos 2 Not Estab. %   Basos 0 Not Estab. %   Neutrophils Absolute 7.5 (H) 1.4 - 7.0 x10E3/uL   Lymphocytes Absolute 1.5 0.7 - 3.1 x10E3/uL   Monocytes Absolute 0.8 0.1 - 0.9 x10E3/uL   EOS (ABSOLUTE) 0.2 0.0 - 0.4 x10E3/uL   Basophils Absolute 0.0 0.0 - 0.2 x10E3/uL   Immature Granulocytes 1 Not Estab. %   Immature Grans (Abs) 0.1 0.0 - 0.1 F79K2/IO  Basic Metabolic Panel (BMET)  Result Value Ref Range   Glucose 217 (H) 65 - 99 mg/dL   BUN 35 (H) 6 - 24 mg/dL   Creatinine, Ser 1.90 (H) 0.76 - 1.27 mg/dL   GFR calc non Af Amer 39 (L) >59 mL/min/1.73   GFR calc Af Amer 45 (L) >59 mL/min/1.73   BUN/Creatinine Ratio 18 9 - 20   Sodium 134 134 - 144 mmol/L   Potassium 4.1 3.5 - 5.2 mmol/L   Chloride 100 96 - 106 mmol/L   CO2 18 (L) 20 - 29 mmol/L   Calcium 9.1 8.7 - 10.2 mg/dL  Uric acid  Result Value Ref Range   Uric Acid 8.1 3.7 - 8.6 mg/dL      Assessment & Plan:   Problem List Items Addressed This Visit      Cardiovascular and Mediastinum   Hypertensive heart/kidney disease without HF and with CKD stage III ([place])    Did not take his BP medicine this AM. Encouraged him to take his medicine as prescribed. Recheck 1 month.         Endocrine   Type 2 diabetes with nephropathy ([place]) - Primary    He has not been taking his novolog. Sugars have been running no higher than 150 without novolog. Will stop novolog, continue his levemir and continue to monitor. If kidney functions remains good, will transition from insulin to oral for ease of use as he doesn't like checking his  sugars.       Relevant Orders   Comprehensive metabolic panel    Ambulatory referral to Ophthalmology     Musculoskeletal and Integument   Acute gout due to renal impairment involving toe of right foot    Has not had any flares. Taking his allopurinol. Rechecking levels today. Await results. Call with any concerns.         Genitourinary   Chronic kidney disease, stage 3 (moderate) ([facility])    Rechecking levels today. Await results. Call with any concerns.       Relevant Orders   Comprehensive metabolic panel    Other Visit Diagnoses    Chronic gout due to renal impairment without tophus, unspecified site       Relevant Orders   Comprehensive metabolic panel   Uric acid   Immunization due       Pneumovax given today.   Relevant Orders   Pneumococcal polysaccharide vaccine 23-valent greater than or equal to 2yo subcutaneous/IM (Completed)       Follow up plan: Return in about 4 weeks (around 03/27/2018).

## 2018-02-28 ENCOUNTER — Telehealth: Payer: Self-pay | Admitting: Family Medicine

## 2018-02-28 LAB — COMPREHENSIVE METABOLIC PANEL
ALT: 36 IU/L (ref 0–44)
AST: 19 IU/L (ref 0–40)
Albumin/Globulin Ratio: 1.5 (ref 1.2–2.2)
Albumin: 3.7 g/dL — ABNORMAL LOW (ref 3.8–4.9)
Alkaline Phosphatase: 61 IU/L (ref 39–117)
BUN/Creatinine Ratio: 13 (ref 9–20)
BUN: 22 mg/dL (ref 6–24)
Bilirubin Total: <0.2 mg/dL (ref 0.0–1.2)
CO2: 23 mmol/L (ref 20–29)
Calcium: 8.8 mg/dL (ref 8.7–10.2)
Chloride: 104 mmol/L (ref 96–106)
Creatinine, Ser: 1.74 mg/dL — ABNORMAL HIGH (ref 0.76–1.27)
GFR calc Af Amer: 50 mL/min/{1.73_m2} — ABNORMAL LOW (ref >59)
GFR calc non Af Amer: 43 mL/min/{1.73_m2} — ABNORMAL LOW (ref >59)
Globulin, Total: 2.4 g/dL (ref 1.5–4.5)
Glucose: 208 mg/dL — ABNORMAL HIGH (ref 65–99)
Potassium: 4.4 mmol/L (ref 3.5–5.2)
Sodium: 142 mmol/L (ref 134–144)
Total Protein: 6.1 g/dL (ref 6.0–8.5)

## 2018-02-28 LAB — URIC ACID: Uric Acid: 6.2 mg/dL (ref 3.7–8.6)

## 2018-02-28 NOTE — Telephone Encounter (Signed)
Please let him know that his kidneys are a little better and his uric acid is way better. We'll recheck it in a month and see if we can get him off the injectable. Thanks!

## 2018-02-28 NOTE — Telephone Encounter (Signed)
Patients wife notified

## 2018-03-04 ENCOUNTER — Ambulatory Visit
Admission: RE | Admit: 2018-03-04 | Discharge: 2018-03-04 | Disposition: A | Discharge status: Home / Self Care | Payer: 59 | Source: Ambulatory Visit | Attending: Family Medicine | Admitting: Family Medicine

## 2018-03-04 ENCOUNTER — Telehealth: Payer: Self-pay | Admitting: Family Medicine

## 2018-03-04 ENCOUNTER — Encounter: Payer: Self-pay | Admitting: Family Medicine

## 2018-03-04 ENCOUNTER — Ambulatory Visit: Payer: Self-pay | Admitting: Family Medicine

## 2018-03-04 ENCOUNTER — Ambulatory Visit (INDEPENDENT_AMBULATORY_CARE_PROVIDER_SITE_OTHER): Payer: 59 | Admitting: Family Medicine

## 2018-03-04 VITALS — BP 174/128 | HR 87 | Temp 98.1°F | Wt 221.7 lb

## 2018-03-04 DIAGNOSIS — M25571 Pain in right ankle and joints of right foot: Secondary | ICD-10-CM | POA: Insufficient documentation

## 2018-03-04 DIAGNOSIS — M10371 Gout due to renal impairment, right ankle and foot: Secondary | ICD-10-CM | POA: Diagnosis not present

## 2018-03-04 MED ORDER — INDOMETHACIN ER 75 MG PO CPCR: 75.0000 mg | ORAL_CAPSULE | Freq: Two times a day (BID) | ORAL | 0 refills | Status: DC

## 2018-03-04 NOTE — Assessment & Plan Note (Signed)
Uric acid doing better last visit but in significant pain. Will restart indomethacin and check labs. Await results. Call with any concerns.

## 2018-03-04 NOTE — Progress Notes (Signed)
BP (!) 174/128 (BP Location: Left Arm, Cuff Size: Normal)   Pulse 87   Temp 98.1 F (36.7 C) (Oral)   Wt 221 lb 11.2 oz (100.6 kg)   SpO2 94%   BMI 35.78 kg/m    Subjective:    Patient ID: Anthony Ouch., male    DOB: 11/03/1960, 58 y.o.   MRN: [id number]  HPI: Anthony Asher. is a 58 y.o. male  Chief Complaint  Patient presents with  . Gout   TOE PAIN- also hurting worse in his ankle.  Duration: Saturday Involved toe: R big toe  Mechanism of injury: unknown Onset: sudden Severity: severe  Quality: aching Frequency: constant Radiation: no Aggravating factors: touching it   Alleviating factors: shaking his foot   Status: worse Treatments attempted: rest and heat  Relief with NSAIDs?: significant Morning stiffness: no Redness: no  Bruising: no Swelling: yes Paresthesias / decreased sensation: no Fevers: no  Relevant past medical, surgical, family and social history reviewed and updated as indicated. Interim medical history since our last visit reviewed. Allergies and medications reviewed and updated.  Review of Systems  Constitutional: Negative.   Respiratory: Negative.   Cardiovascular: Negative.   Musculoskeletal: Positive for arthralgias and joint swelling. Negative for back pain, gait problem, myalgias, neck pain and neck stiffness.  Skin: Negative.   Neurological: Negative.   Psychiatric/Behavioral: Negative.     Per HPI unless specifically indicated above     Objective:    BP (!) 174/128 (BP Location: Left Arm, Cuff Size: Normal)   Pulse 87   Temp 98.1 F (36.7 C) (Oral)   Wt 221 lb 11.2 oz (100.6 kg)   SpO2 94%   BMI 35.78 kg/m   Wt Readings from Last 3 Encounters:  03/04/18 221 lb 11.2 oz (100.6 kg)  02/27/18 222 lb 9 oz (101 kg)  02/21/18 218 lb 6.4 oz (99.1 kg)    Physical Exam Vitals signs and nursing note reviewed.  Constitutional:      General: He is not in acute distress.    Appearance: Normal appearance. He is not  ill-appearing, toxic-appearing or diaphoretic.  HENT:     Head: Normocephalic and atraumatic.     Right Ear: External ear normal.     Left Ear: External ear normal.     Nose: Nose normal.     Mouth/Throat:     Mouth: Mucous membranes are moist.     Pharynx: Oropharynx is clear.  Eyes:     General: No scleral icterus.       Right eye: No discharge.        Left eye: No discharge.     Extraocular Movements: Extraocular movements intact.     Conjunctiva/sclera: Conjunctivae normal.     Pupils: Pupils are equal, round, and reactive to light.  Neck:     Musculoskeletal: Normal range of motion and neck supple.  Cardiovascular:     Rate and Rhythm: Normal rate and regular rhythm.     Pulses: Normal pulses.     Heart sounds: Normal heart sounds. No murmur. No friction rub. No gallop.   Pulmonary:     Effort: Pulmonary effort is normal. No respiratory distress.     Breath sounds: Normal breath sounds. No stridor. No wheezing, rhonchi or rales.  Chest:     Chest wall: No tenderness.  Musculoskeletal:        General: Swelling and tenderness present. No deformity or signs of injury.  Right lower leg: No edema.     Left lower leg: No edema.     Comments: No redness, but some heat and swelling in the great big toe, tenderness to light palpation, normal ankle exam  Skin:    General: Skin is warm and dry.     Capillary Refill: Capillary refill takes less than 2 seconds.     Coloration: Skin is not jaundiced or pale.     Findings: No bruising, erythema, lesion or rash.  Neurological:     General: No focal deficit present.     Mental Status: He is alert and oriented to person, place, and time. Mental status is at baseline.  Psychiatric:        Mood and Affect: Mood normal.        Behavior: Behavior normal.        Thought Content: Thought content normal.        Judgment: Judgment normal.     Results for orders placed or performed in visit on 02/27/18  Comprehensive metabolic panel    Result Value Ref Range   Glucose 208 (H) 65 - 99 mg/dL   BUN 22 6 - 24 mg/dL   Creatinine, Ser 1.74 (H) 0.76 - 1.27 mg/dL   GFR calc non Af Amer 43 (L) >59 mL/min/1.73   GFR calc Af Amer 50 (L) >59 mL/min/1.73   BUN/Creatinine Ratio 13 9 - 20   Sodium 142 134 - 144 mmol/L   Potassium 4.4 3.5 - 5.2 mmol/L   Chloride 104 96 - 106 mmol/L   CO2 23 20 - 29 mmol/L   Calcium 8.8 8.7 - 10.2 mg/dL   Total Protein 6.1 6.0 - 8.5 g/dL   Albumin 3.7 (L) 3.8 - 4.9 g/dL   Globulin, Total 2.4 1.5 - 4.5 g/dL   Albumin/Globulin Ratio 1.5 1.2 - 2.2   Bilirubin Total <0.2 0.0 - 1.2 mg/dL   Alkaline Phosphatase 61 39 - 117 IU/L   AST 19 0 - 40 IU/L   ALT 36 0 - 44 IU/L  Uric acid  Result Value Ref Range   Uric Acid 6.2 3.7 - 8.6 mg/dL      Assessment & Plan:   Problem List Items Addressed This Visit      Musculoskeletal and Integument   Acute gout due to renal impairment involving toe of right foot - Primary    Uric acid doing better last visit but in significant pain. Will restart indomethacin and check labs. Await results. Call with any concerns.       Relevant Medications   indomethacin (INDOCIN SR) 75 MG CR capsule   Other Relevant Orders   Uric acid   Basic metabolic panel    Other Visit Diagnoses    Acute right ankle pain       Likely compensation from his toe- will check x-ray. Await results. Call with any concerns.    Relevant Orders   DG Ankle Complete Right       Follow up plan: Return if symptoms worsen or fail to improve.

## 2018-03-04 NOTE — Telephone Encounter (Signed)
Please let him know that he has some arthritis in his ankle- the medicine should help, and if it doesn't, let me know. Thanks!

## 2018-03-04 NOTE — Telephone Encounter (Signed)
    [place] Male, 58 y.o., 1960/11/28 MRN:  [id number] Phone:  [phone] Jerilynn Mages) PCP:  Valerie Roys, DO Primary Cvg:  BLUE CROSS BLUE SHIELD/BCBS OTHER Next Appt With Nutrition & Diabetics 03/07/2018 at 9:00 AM Message from Scherrie Gerlach sent at 03/04/2018 1:29 PM EST   Summary: advice   Pt was prescribed indomethacin (INDOCIN SR) 75 MG CR capsule this am. The pharmacist advised him not to take with his aspirin, and also concerned he may not need to take because he has high blood pressure too. Wants to make sure this med is right for him        Call History    Type Contact Phone User  03/04/2018 01:27 PM Phone (Incoming) [place],Nancy G (Emergency Contact) [phone] Scherrie Gerlach

## 2018-03-04 NOTE — Telephone Encounter (Signed)
He's going to be on it for 1 week. It should be fine. Thanks!

## 2018-03-04 NOTE — Telephone Encounter (Signed)
Called and left detailed message with information on patients vm. DPR was checked.   

## 2018-03-04 NOTE — Telephone Encounter (Signed)
Message relayed to patient. Verbalized understanding and denied questions.   

## 2018-03-05 ENCOUNTER — Telehealth: Payer: Self-pay | Admitting: Family Medicine

## 2018-03-05 LAB — BASIC METABOLIC PANEL
BUN/Creatinine Ratio: 9 (ref 9–20)
BUN: 15 mg/dL (ref 6–24)
CHLORIDE: 103 mmol/L (ref 96–106)
CO2: 21 mmol/L (ref 20–29)
Calcium: 8.7 mg/dL (ref 8.7–10.2)
Creatinine, Ser: 1.58 mg/dL — ABNORMAL HIGH (ref 0.76–1.27)
GFR calc Af Amer: 56 mL/min/{1.73_m2} — ABNORMAL LOW (ref >59)
GFR calc non Af Amer: 48 mL/min/{1.73_m2} — ABNORMAL LOW (ref >59)
Glucose: 124 mg/dL — ABNORMAL HIGH (ref 65–99)
Potassium: 4.1 mmol/L (ref 3.5–5.2)
Sodium: 142 mmol/L (ref 134–144)

## 2018-03-05 LAB — URIC ACID: Uric Acid: 5.9 mg/dL (ref 3.7–8.6)

## 2018-03-05 NOTE — Telephone Encounter (Signed)
Please let him know that his uric acid is beautiful and his kidney functions keep getting better. Thanks!

## 2018-03-05 NOTE — Telephone Encounter (Signed)
Message relayed to patient. Verbalized understanding and denied questions.   

## 2018-03-07 ENCOUNTER — Encounter: Payer: Self-pay | Admitting: Dietician

## 2018-03-07 ENCOUNTER — Encounter: Payer: 59 | Attending: Family Medicine | Admitting: Dietician

## 2018-03-07 VITALS — Ht 66.0 in | Wt 222.0 lb

## 2018-03-07 DIAGNOSIS — E1310 Other specified diabetes mellitus with ketoacidosis without coma: Secondary | ICD-10-CM | POA: Insufficient documentation

## 2018-03-07 DIAGNOSIS — Z713 Dietary counseling and surveillance: Secondary | ICD-10-CM | POA: Diagnosis present

## 2018-03-07 DIAGNOSIS — Z6835 Body mass index (BMI) 35.0-35.9, adult: Secondary | ICD-10-CM | POA: Insufficient documentation

## 2018-03-07 DIAGNOSIS — E1165 Type 2 diabetes mellitus with hyperglycemia: Secondary | ICD-10-CM

## 2018-03-07 NOTE — Progress Notes (Signed)

## 2018-03-14 ENCOUNTER — Telehealth: Payer: Self-pay | Admitting: Family Medicine

## 2018-03-14 ENCOUNTER — Encounter: Payer: 59 | Admitting: Dietician

## 2018-03-14 ENCOUNTER — Other Ambulatory Visit: Payer: Self-pay | Admitting: Family Medicine

## 2018-03-14 ENCOUNTER — Encounter: Payer: Self-pay | Admitting: Dietician

## 2018-03-14 VITALS — Wt 226.4 lb

## 2018-03-14 DIAGNOSIS — Z713 Dietary counseling and surveillance: Secondary | ICD-10-CM | POA: Diagnosis not present

## 2018-03-14 DIAGNOSIS — E1165 Type 2 diabetes mellitus with hyperglycemia: Secondary | ICD-10-CM

## 2018-03-14 MED ORDER — ASPIRIN 81 MG PO TBEC: 81.0000 mg | DELAYED_RELEASE_TABLET | Freq: Every day | ORAL | 12 refills | Status: DC

## 2018-03-14 MED ORDER — METOPROLOL TARTRATE 25 MG PO TABS: 25.0000 mg | ORAL_TABLET | Freq: Two times a day (BID) | ORAL | 5 refills | Status: DC

## 2018-03-14 MED ORDER — ATORVASTATIN CALCIUM 80 MG PO TABS: 80.0000 mg | ORAL_TABLET | Freq: Every day | ORAL | 5 refills | Status: DC

## 2018-03-14 NOTE — Progress Notes (Signed)

## 2018-03-14 NOTE — Telephone Encounter (Signed)
Copied from [place] [id number]. Topic: Quick Communication - Rx Refill/Question >> Mar 14, 2018  8:34 AM Virl Axe D wrote: Medication: allopurinol (ZYLOPRIM) 100 MG tablet / aspirin EC 81 MG EC tablet / atorvastatin (LIPITOR) 80 MG tablet / indomethacin (INDOCIN SR) 75 MG CR capsule / metoprolol tartrate (LOPRESSOR) 25 MG tablet   Has the patient contacted their pharmacy? Yes.   (Agent: If no, request that the patient contact the pharmacy for the refill.) (Agent: If yes, when and what did the pharmacy advise?)  Preferred Pharmacy (with phone number or street name): [id number] - [place], [place] [phone] (Phone) [phone] (Fax)  Agent: Please be advised that RX refills may take up to 3 business days. We ask that you follow-up with your pharmacy.

## 2018-03-14 NOTE — Telephone Encounter (Signed)
Copied from [place] [id number]. Topic: Conservator, museum/gallery Patient (Clinic Use ONLY) >> Mar 11, 2018  4:05 PM Anthony Sellers, CMA wrote: Reason for CRM: Called patient to find out what dates he was off work for this short term disability. >> Mar 11, 2018  4:24 PM Anthony Sellers wrote: Patient has been out of work since 1/21 and has not returned.  >> Mar 14, 2018 12:54 PM Anthony Sellers wrote: Patient's wife Anthony Sellers just called and said he did not get his short term disability check because the medical form was not sent in. It needs to be faxed to [employer] @ [phone] >> Mar 14, 2018  1:23 PM [place], Anthony Sellers, CMA wrote: Notified patient's wife that Dr.Sevyn Markham is out of the office and that we will get the paperwork filled out as soon as she returns.

## 2018-03-14 NOTE — Telephone Encounter (Signed)
Just spoke with his wife, she states that it has flared again, that is why he is coming in tomorrow.

## 2018-03-14 NOTE — Telephone Encounter (Signed)
Can we please find out why he has not returned to work?   I see that he's seeing Apolonio Schneiders tomorrow- his uric acid was normal last visit, so if he is still in pain, we'll need to refer him to podiatry and formulate a plan to get him back to work.

## 2018-03-15 ENCOUNTER — Other Ambulatory Visit: Payer: Self-pay | Admitting: Family Medicine

## 2018-03-15 ENCOUNTER — Telehealth: Payer: Self-pay | Admitting: Family Medicine

## 2018-03-15 ENCOUNTER — Ambulatory Visit (INDEPENDENT_AMBULATORY_CARE_PROVIDER_SITE_OTHER): Payer: 59 | Admitting: Family Medicine

## 2018-03-15 ENCOUNTER — Encounter: Payer: Self-pay | Admitting: Family Medicine

## 2018-03-15 ENCOUNTER — Ambulatory Visit: Payer: Self-pay | Admitting: *Deleted

## 2018-03-15 VITALS — BP 182/132 | HR 105 | Temp 98.6°F | Wt 224.5 lb

## 2018-03-15 DIAGNOSIS — M10371 Gout due to renal impairment, right ankle and foot: Secondary | ICD-10-CM | POA: Diagnosis not present

## 2018-03-15 DIAGNOSIS — N183 Chronic kidney disease, stage 3 unspecified: Secondary | ICD-10-CM

## 2018-03-15 DIAGNOSIS — I1310 Hypertensive heart and chronic kidney disease without heart failure, with stage 1 through stage 4 chronic kidney disease, or unspecified chronic kidney disease: Secondary | ICD-10-CM | POA: Diagnosis not present

## 2018-03-15 NOTE — Telephone Encounter (Signed)
Copied from [place] [id number]. Topic: General - Other >> Mar 15, 2018  2:08 PM Lennox Solders wrote: Reason for CRM:Pt wife is calling and she brought OTC tart cherry extract 1200 mg.  Provider wrote down tart cherry capsules not extract. Pt wife would like to know if that the same thing Pt also waiting on gout medication [facility]. Also need bp machine

## 2018-03-15 NOTE — Telephone Encounter (Signed)
Re-sent indomethacin in another encounter. They may be able to find through [place] in the supplement section based on my quick online search

## 2018-03-15 NOTE — Telephone Encounter (Signed)
Evaluated pt this morning - having what appears to be another gout flare.   Pt states he was told by either Jolene or Dr. Wynetta Emery (unclear to me) that he should remain out of work until his sugars and gout are under better control. Has been out since 02/12/18.   Will route to both providers for further advice.

## 2018-03-15 NOTE — Telephone Encounter (Signed)
Copied from [place] [id number]. Topic: Quick Communication - Rx Refill/Question >> Mar 15, 2018 12:00 PM [place], [place] D wrote: Medication: Tart Cherry Capsules / indomethacin (INDOCIN SR) 75 MG CR capsule / [name] called and stated that no rxs had been sent to pharmacy yet. Pt had OV this morning with Merrie Roof. Please advise once refills have been sent in.  Has the patient contacted their pharmacy? Yes.   (Agent: If no, request that the patient contact the pharmacy for the refill.) (Agent: If yes, when and what did the pharmacy advise?)  Preferred Pharmacy (with phone number or street name): [id number] [place], [address] [phone] (Phone) [phone] (Fax)  Agent: Please be advised that RX refills may take up to 3 business days. We ask that you follow-up with your pharmacy.

## 2018-03-15 NOTE — Telephone Encounter (Signed)
[facility], and forgot to give him the script for BP machine they can either come by front desk to pick up or grab it next week at the f/u

## 2018-03-15 NOTE — Progress Notes (Signed)
BP (!) 182/132 (BP Location: Right Arm, Cuff Size: Normal)   Pulse (!) 105   Temp 98.6 F (37 C) (Oral)   Wt 224 lb 8 oz (101.8 kg)   SpO2 95%   BMI 36.24 kg/m    Subjective:    Patient ID: Anthony Ouch., male    DOB: 08-12-1960, 58 y.o.   MRN: [id number]  HPI: Anthony Blagg. is a 58 y.o. male  Chief Complaint  Patient presents with  . Gout    pt states Anthony Sellers finished the indomethacin and gout flared back up   Here today for recurring right great toe pain x 3 days. Two days after stopping the indomethacin course sxs returned. Pain, redness, swelling - states Anthony Sellers can barely ambulate on it. Has been faithfully taking his 50 mg allopurinol the past month. Tylenol not helping with pain sxs at all.   Relevant past medical, surgical, family and social history reviewed and updated as indicated. Interim medical history since our last visit reviewed. Allergies and medications reviewed and updated.  Review of Systems  Per HPI unless specifically indicated above     Objective:    BP (!) 182/132 (BP Location: Right Arm, Cuff Size: Normal)   Pulse (!) 105   Temp 98.6 F (37 C) (Oral)   Wt 224 lb 8 oz (101.8 kg)   SpO2 95%   BMI 36.24 kg/m   Wt Readings from Last 3 Encounters:  03/15/18 224 lb 8 oz (101.8 kg)  03/14/18 226 lb 6.4 oz (102.7 kg)  03/07/18 222 lb (100.7 kg)    Physical Exam Vitals signs and nursing note reviewed.  Constitutional:      Appearance: Normal appearance.  HENT:     Head: Atraumatic.  Eyes:     Extraocular Movements: Extraocular movements intact.     Conjunctiva/sclera: Conjunctivae normal.  Neck:     Musculoskeletal: Normal range of motion and neck supple.  Cardiovascular:     Rate and Rhythm: Normal rate and regular rhythm.  Pulmonary:     Effort: Pulmonary effort is normal.     Breath sounds: Normal breath sounds.  Musculoskeletal:        General: Swelling and tenderness (great toe r foot) present.     Comments: Antalgic gait    Skin:    General: Skin is warm and dry.     Findings: Erythema (right great toe) present.  Neurological:     General: No focal deficit present.     Mental Status: Anthony Sellers is oriented to person, place, and time.  Psychiatric:        Mood and Affect: Mood normal.        Thought Content: Thought content normal.        Judgment: Judgment normal.     Results for orders placed or performed in visit on 40/08/67  Basic Metabolic Panel (BMET)  Result Value Ref Range   Glucose 141 (H) 65 - 99 mg/dL   BUN 19 6 - 24 mg/dL   Creatinine, Ser 1.55 (H) 0.76 - 1.27 mg/dL   GFR calc non Af Amer 49 (L) >59 mL/min/1.73   GFR calc Af Amer 57 (L) >59 mL/min/1.73   BUN/Creatinine Ratio 12 9 - 20   Sodium 138 134 - 144 mmol/L   Potassium 4.4 3.5 - 5.2 mmol/L   Chloride 101 96 - 106 mmol/L   CO2 22 20 - 29 mmol/L   Calcium 9.4 8.7 - 10.2 mg/dL  Uric acid  Result Value Ref Range   Uric Acid 6.8 3.7 - 8.6 mg/dL      Assessment & Plan:   Problem List Items Addressed This Visit      Cardiovascular and Mediastinum   Hypertensive heart/kidney disease without HF and with CKD stage III ([place])    Did not take his medicine and is in significant pain. Work on checking home BPs, may need to adjust medicines if still this high at 1 week recheck        Musculoskeletal and Integument   Acute gout due to renal impairment involving toe of right foot - Primary    Recheck bmp and uric acid today. Restart 1 week of indomethacin, increase allopurinol to 100 mg daily. Start tart cherry capsules. Continue good dietary modifications. Recheck in 1 week      Relevant Orders   Basic Metabolic Panel (BMET) (Completed)   Uric acid (Completed)     Genitourinary   Chronic kidney disease, stage 3 (moderate) (HCC)    Recheck bmp          Follow up plan: Return in about 1 week (around 03/22/2018) for Gout f/u with Dr. Wynetta Emery.

## 2018-03-15 NOTE — Telephone Encounter (Signed)
Requested medication (s) are due for refill today: {pt stated that drug was to be reordered today  Requested medication (s) are on the active medication list: yes  Last refill:  03/04/18  Future visit scheduled: yes  Notes to clinic:  Pt stated at [facility] provider stated she will refill the medication. Pt called back to notify medication was not at pharmacy   Requested Prescriptions  Pending Prescriptions Disp Refills   indomethacin (INDOCIN SR) 75 MG CR capsule [Pharmacy Med Name: INDOMETHACIN ER 75 MG CAPSULE] 14 capsule 0    Sig: Take 1 capsule (75 mg total) by mouth 2 (two) times daily with a meal.     Analgesics:  NSAIDS Failed - 03/15/2018  1:36 PM      Failed - Cr in normal range and within 360 days    Creatinine, Ser  Date Value Ref Range Status  03/04/2018 1.58 (H) 0.76 - 1.27 mg/dL Final         Passed - HGB in normal range and within 360 days    Hemoglobin  Date Value Ref Range Status  02/18/2018 13.1 13.0 - 17.7 g/dL Final         Passed - Patient is not pregnant      Passed - Valid encounter within last 12 months    Recent Outpatient Visits          Today Acute gout due to renal impairment involving toe of right foot   [place], [place], [place]   1 week ago Acute gout due to renal impairment involving toe of right foot   [place], [name], DO   2 weeks ago Type 2 diabetes with nephropathy ([place])   [place], [name], DO   3 weeks ago Type 2 diabetes with nephropathy ([place])   [place], Henrine Screws T, NP   1 month ago Fatigue, unspecified type   [facility] [place], Barbaraann Faster, NP      Future Appointments            In 5 days Wynetta Emery, Barb Merino, DO [employer], [place]   In 1 month [address], [name], DO [employer], [place]

## 2018-03-15 NOTE — Telephone Encounter (Signed)
Called pharmacy on record and they did not receive prescription. Asked pharmacist about Tart cherry and she also stated they do not have it at their pharmacy.

## 2018-03-15 NOTE — Telephone Encounter (Signed)
Yep that should be the one!

## 2018-03-15 NOTE — Telephone Encounter (Signed)
  Reason for Disposition . Caller requesting a NON-URGENT new prescription or refill and triager unable to refill per unit policy  Protocols used: MEDICATION QUESTION CALL-A-AH

## 2018-03-15 NOTE — Telephone Encounter (Addendum)
Summary: Call back    Patients wife called asking what he can take for pain for gout in his feet he is a diabetic, a prescription was sent to pharmacy a short while ago  Best call back is [phone]     Call to wife- explained the indomethacin is antiinflammatory and that should help with his discomfort. He has not started it yet- but will as soon as they pick it up. Also verified per call note in chart - patient has correct tart cherry extract supplement.

## 2018-03-15 NOTE — Telephone Encounter (Signed)
Anthony Sellers, what about gout medication and bp machine?

## 2018-03-15 NOTE — Telephone Encounter (Signed)
He does have FMLA papers.  After recent hospitalization he was advised to work on getting new diabetes dx under control prior to return to work.  He can return to work now though, as he is improving I believe in that aspect.  I agree with Dr. Wynetta Emery that he probably needs podiatry and work on getting him back to work if he is improving.  He has this ongoing gout issue, but last level had improved.

## 2018-03-15 NOTE — Patient Instructions (Signed)
Tart cherry capsules

## 2018-03-16 LAB — BASIC METABOLIC PANEL
BUN / CREAT RATIO: 12 (ref 9–20)
BUN: 19 mg/dL (ref 6–24)
CO2: 22 mmol/L (ref 20–29)
Calcium: 9.4 mg/dL (ref 8.7–10.2)
Chloride: 101 mmol/L (ref 96–106)
Creatinine, Ser: 1.55 mg/dL — ABNORMAL HIGH (ref 0.76–1.27)
GFR calc Af Amer: 57 mL/min/{1.73_m2} — ABNORMAL LOW (ref >59)
GFR calc non Af Amer: 49 mL/min/{1.73_m2} — ABNORMAL LOW (ref >59)
Glucose: 141 mg/dL — ABNORMAL HIGH (ref 65–99)
Potassium: 4.4 mmol/L (ref 3.5–5.2)
Sodium: 138 mmol/L (ref 134–144)

## 2018-03-16 LAB — URIC ACID: Uric Acid: 6.8 mg/dL (ref 3.7–8.6)

## 2018-03-16 NOTE — Assessment & Plan Note (Signed)
Recheck bmp and uric acid today. Restart 1 week of indomethacin, increase allopurinol to 100 mg daily. Start tart cherry capsules. Continue good dietary modifications. Recheck in 1 week

## 2018-03-16 NOTE — Assessment & Plan Note (Signed)
Recheck bmp

## 2018-03-16 NOTE — Assessment & Plan Note (Signed)
Did not take his medicine and is in significant pain. Work on checking home BPs, may need to adjust medicines if still this high at 1 week recheck

## 2018-03-18 NOTE — Telephone Encounter (Signed)
Patient's wife, Izora Gala, calling back to check status of disability paperwork.

## 2018-03-18 NOTE — Telephone Encounter (Signed)
Call pt when completed

## 2018-03-18 NOTE — Telephone Encounter (Signed)
Called and left detailed message (DPR reviewed) and asked patient to return call with any questions about the message.

## 2018-03-18 NOTE — Telephone Encounter (Signed)
Called and spoke to patient's wife. She asked what date Dr. Wynetta Emery put on the form that the patient could go back and I told her 03/21/18. Patient's wife states that the last forms that were done by [facility] said to return in March. Will clarify dates in the morning with Dr. Wynetta Emery and Henrine Screws when they return to the office.

## 2018-03-18 NOTE — Telephone Encounter (Signed)
Jolene said that it may take up to 2 months. His sugars have been good. He does not still need to be out of work. He can return on Thursday.

## 2018-03-18 NOTE — Telephone Encounter (Signed)
Patient's wife Izora Gala calling to check on status of disability paperwork. Advised per Christan that Dr. Wynetta Emery is in today and will review around lunch. Office staff will call patient to let him or his wife know once it's faxed/completed.

## 2018-03-19 NOTE — Telephone Encounter (Signed)
Called and spoke with patient's wife. I explained to her what Dr. Wynetta Emery said. Patient's wife asked to have the paperwork faxed back but that the patient would speak with Dr. Wynetta Emery about the dates at his appointment with her tomorrow.

## 2018-03-20 ENCOUNTER — Ambulatory Visit (INDEPENDENT_AMBULATORY_CARE_PROVIDER_SITE_OTHER): Payer: 59 | Admitting: Family Medicine

## 2018-03-20 ENCOUNTER — Encounter: Payer: Self-pay | Admitting: Family Medicine

## 2018-03-20 ENCOUNTER — Other Ambulatory Visit: Payer: Self-pay

## 2018-03-20 VITALS — BP 176/105 | HR 80 | Temp 97.9°F | Ht 66.0 in | Wt 224.4 lb

## 2018-03-20 DIAGNOSIS — I1310 Hypertensive heart and chronic kidney disease without heart failure, with stage 1 through stage 4 chronic kidney disease, or unspecified chronic kidney disease: Secondary | ICD-10-CM

## 2018-03-20 DIAGNOSIS — N183 Chronic kidney disease, stage 3 unspecified: Secondary | ICD-10-CM

## 2018-03-20 DIAGNOSIS — M79671 Pain in right foot: Secondary | ICD-10-CM

## 2018-03-20 MED ORDER — LISINOPRIL 10 MG PO TABS: 10.0000 mg | ORAL_TABLET | Freq: Every day | ORAL | 3 refills | Status: DC

## 2018-03-20 NOTE — Assessment & Plan Note (Signed)
Not under good control. Kidney function has been stable. Will start lisinopril and watch kidney function closely- recheck 1 month.

## 2018-03-20 NOTE — Progress Notes (Signed)
BP (!) 176/105 (BP Location: Left Arm, Patient Position: Sitting, Cuff Size: Large)   Pulse 80   Temp 97.9 F (36.6 C)   Ht 5\' 6"  (1.676 m)   Wt 224 lb 7 oz (101.8 kg)   SpO2 94%   BMI 36.23 kg/m    Subjective:    Patient ID: Anthony Ouch., male    DOB: 11-Jan-1961, 58 y.o.   MRN: [id number]  HPI: Anthony Sellers. is a 58 y.o. male  No chief complaint on file.  States that his foot is hurting a lot because he was walking on a lot yesterday when he went fishing. Uric acid has been good.   HYPERTENSION Hypertension status: exacerbated  Satisfied with current treatment? yes Duration of hypertension: chronic BP monitoring frequency:  not checking BP medication side effects:  no Medication compliance: fair compliance Previous BP meds: hydralazine, metoprolol Aspirin: no Recurrent headaches: no Visual changes: no Palpitations: no Dyspnea: no Chest pain: no Lower extremity edema: no Dizzy/lightheaded: no  Relevant past medical, surgical, family and social history reviewed and updated as indicated. Interim medical history since our last visit reviewed. Allergies and medications reviewed and updated.  Review of Systems  Constitutional: Negative.   Respiratory: Negative.   Cardiovascular: Negative.   Musculoskeletal: Positive for arthralgias. Negative for back pain, gait problem, joint swelling, myalgias, neck pain and neck stiffness.  Skin: Negative.   Psychiatric/Behavioral: Negative.     Per HPI unless specifically indicated above     Objective:    BP (!) 176/105 (BP Location: Left Arm, Patient Position: Sitting, Cuff Size: Large)   Pulse 80   Temp 97.9 F (36.6 C)   Ht 5\' 6"  (1.676 m)   Wt 224 lb 7 oz (101.8 kg)   SpO2 94%   BMI 36.23 kg/m   Wt Readings from Last 3 Encounters:  03/20/18 224 lb 7 oz (101.8 kg)  03/15/18 224 lb 8 oz (101.8 kg)  03/14/18 226 lb 6.4 oz (102.7 kg)    Physical Exam Vitals signs and nursing note reviewed.    Constitutional:      General: He is not in acute distress.    Appearance: Normal appearance. He is not ill-appearing, toxic-appearing or diaphoretic.  HENT:     Head: Normocephalic and atraumatic.     Right Ear: External ear normal.     Left Ear: External ear normal.     Nose: Nose normal.     Mouth/Throat:     Mouth: Mucous membranes are moist.     Pharynx: Oropharynx is clear.  Eyes:     General: No scleral icterus.       Right eye: No discharge.        Left eye: No discharge.     Extraocular Movements: Extraocular movements intact.     Conjunctiva/sclera: Conjunctivae normal.     Pupils: Pupils are equal, round, and reactive to light.  Neck:     Musculoskeletal: Normal range of motion and neck supple.  Cardiovascular:     Rate and Rhythm: Normal rate and regular rhythm.     Pulses: Normal pulses.     Heart sounds: Normal heart sounds. No murmur. No friction rub. No gallop.   Pulmonary:     Effort: Pulmonary effort is normal. No respiratory distress.     Breath sounds: Normal breath sounds. No stridor. No wheezing, rhonchi or rales.  Chest:     Chest wall: No tenderness.  Musculoskeletal: Normal range of motion.  Skin:    General: Skin is warm and dry.     Capillary Refill: Capillary refill takes less than 2 seconds.     Coloration: Skin is not jaundiced or pale.     Findings: No bruising, erythema, lesion or rash.  Neurological:     General: No focal deficit present.     Mental Status: He is alert and oriented to person, place, and time. Mental status is at baseline.  Psychiatric:        Mood and Affect: Mood normal.        Behavior: Behavior normal.        Thought Content: Thought content normal.        Judgment: Judgment normal.     Results for orders placed or performed in visit on 20/35/59  Basic Metabolic Panel (BMET)  Result Value Ref Range   Glucose 141 (H) 65 - 99 mg/dL   BUN 19 6 - 24 mg/dL   Creatinine, Ser 1.55 (H) 0.76 - 1.27 mg/dL   GFR calc non  Af Amer 49 (L) >59 mL/min/1.73   GFR calc Af Amer 57 (L) >59 mL/min/1.73   BUN/Creatinine Ratio 12 9 - 20   Sodium 138 134 - 144 mmol/L   Potassium 4.4 3.5 - 5.2 mmol/L   Chloride 101 96 - 106 mmol/L   CO2 22 20 - 29 mmol/L   Calcium 9.4 8.7 - 10.2 mg/dL  Uric acid  Result Value Ref Range   Uric Acid 6.8 3.7 - 8.6 mg/dL      Assessment & Plan:   Problem List Items Addressed This Visit      Cardiovascular and Mediastinum   Hypertensive heart/kidney disease without HF and with CKD stage III ([place])    Not under good control. Kidney function has been stable. Will start lisinopril and watch kidney function closely- recheck 1 month.       Relevant Medications   lisinopril (PRINIVIL,ZESTRIL) 10 MG tablet     Genitourinary   Chronic kidney disease, stage 3 (moderate) (HCC)    Stable on last check- will restart lisinopril and recheck 1 month.        Other Visit Diagnoses    Right foot pain    -  Primary   Uric acid has been good. Likely arthritis. Will get him into podiatry ASAP. Call with any concerns.    Relevant Orders   Ambulatory referral to Podiatry       Follow up plan: Return as scheduled.

## 2018-03-20 NOTE — Assessment & Plan Note (Signed)
Stable on last check- will restart lisinopril and recheck 1 month.

## 2018-03-21 ENCOUNTER — Encounter: Payer: 59 | Admitting: Dietician

## 2018-03-21 ENCOUNTER — Encounter: Payer: Self-pay | Admitting: Dietician

## 2018-03-21 VITALS — BP 166/120 | Ht 66.0 in | Wt 224.5 lb

## 2018-03-21 DIAGNOSIS — E1165 Type 2 diabetes mellitus with hyperglycemia: Secondary | ICD-10-CM

## 2018-03-21 DIAGNOSIS — Z713 Dietary counseling and surveillance: Secondary | ICD-10-CM | POA: Diagnosis not present

## 2018-03-21 NOTE — Progress Notes (Signed)

## 2018-03-25 ENCOUNTER — Encounter: Payer: Self-pay | Admitting: *Deleted

## 2018-03-26 ENCOUNTER — Ambulatory Visit: Payer: Self-pay | Admitting: *Deleted

## 2018-03-26 LAB — HM DIABETES EYE EXAM

## 2018-03-26 NOTE — Telephone Encounter (Signed)
Called and spoke to patient's wife. Message relayed. Pt verbalized understanding. She stated that patient went to see his eye dr today and they wrote a note for patient to be out today but if Dr. Wynetta Emery still wants to write a note for today, that will be fine she said.

## 2018-03-26 NOTE — Telephone Encounter (Signed)
Called and spoke with patient's wife. She stated that patient did go to his appointment last week but something went wrong with his insurance and they had to reschedule for tomorrow at 8:30 am.

## 2018-03-26 NOTE — Telephone Encounter (Signed)
I can put him out today. I will not be able to put him out any further for this. Any further time off related to his foot will have to come from his podiatrist.

## 2018-03-26 NOTE — Telephone Encounter (Signed)
Message from Jeri Cos sent at 03/26/2018 9:09 AM EST   Summary: Foot pain issue   [name] wants to know what can he take today for foot pain. Pt going to foot specialist tomorrow but is in pain today.          Pt has gout in his foot. Having a lot of pain. He also has kidney disease and limited on what he can take for pain.  He is taking the extra strength Tylenol but feels like it is not helping. Taking all his medications as prescribed.  Has appointment with Dr. Vickki Muff tomorrow for his foot. His wife stated his short term disability ended yesterday and wanted to know how to get one more day until he sees Dr. Vickki Muff.  She is requesting a call back for any suggestions for pain or ST disability. Routing to flow at [facility].

## 2018-03-26 NOTE — Telephone Encounter (Signed)
We had gotten him an appointment with podiatry last week. Did he not go?

## 2018-03-28 ENCOUNTER — Ambulatory Visit: Payer: 59 | Admitting: Family Medicine

## 2018-04-04 ENCOUNTER — Telehealth: Payer: Self-pay | Admitting: Family Medicine

## 2018-04-04 NOTE — Telephone Encounter (Signed)
Spoke with patient's wife Izora Gala at 5:15pm regarding RX request for ACCU-CHECK test strips requested today at 4pm. Explained to caller that all provider's had left the office and that we ask for at least 48 hours to complete a refill request.  Caller explained that she was unaware that her husband needed a new RX until she contacted the pharmacy and that her husband would not be able to check his blood sugar tonight. I encouraged the caller to purchase a small amount of strips from the pharmacy until the provider is able to review the request and call into the pharmacy tomorrow.

## 2018-04-04 NOTE — Telephone Encounter (Signed)
Copied from [place] [phone]. Topic: Quick Communication - Rx Refill/Question >> Apr 04, 2018  4:00 PM Margot Ables wrote: Medication: ACCU-CHEK GUIDE test strips - pt is completely out and pharmacy advised they cannot refill without a new RX and to call MD  Has the patient contacted their pharmacy? yes Preferred Pharmacy (with phone number or street name[id number] [place], [place] [phone] (Phone) [phone] (Fax)

## 2018-04-05 NOTE — Telephone Encounter (Signed)
Patient wife called back stating that he has no diabetic test strips and need them right away. Asking for an Rx to be sent to the pharmacy please.

## 2018-04-05 NOTE — Telephone Encounter (Signed)
Please fill out diabetic order form and I will sign

## 2018-04-05 NOTE — Telephone Encounter (Signed)
Prescription was already faxed to the pharmacy, patient notified.

## 2018-05-10 ENCOUNTER — Ambulatory Visit (INDEPENDENT_AMBULATORY_CARE_PROVIDER_SITE_OTHER): Payer: 59 | Admitting: Family Medicine

## 2018-05-10 ENCOUNTER — Other Ambulatory Visit: Payer: Self-pay | Admitting: Internal Medicine

## 2018-05-10 ENCOUNTER — Other Ambulatory Visit: Payer: Self-pay

## 2018-05-10 ENCOUNTER — Encounter: Payer: Self-pay | Admitting: Family Medicine

## 2018-05-10 DIAGNOSIS — E1121 Type 2 diabetes mellitus with diabetic nephropathy: Secondary | ICD-10-CM | POA: Diagnosis not present

## 2018-05-10 DIAGNOSIS — N183 Chronic kidney disease, stage 3 unspecified: Secondary | ICD-10-CM

## 2018-05-10 DIAGNOSIS — E782 Mixed hyperlipidemia: Secondary | ICD-10-CM

## 2018-05-10 DIAGNOSIS — M10371 Gout due to renal impairment, right ankle and foot: Secondary | ICD-10-CM

## 2018-05-10 DIAGNOSIS — I1310 Hypertensive heart and chronic kidney disease without heart failure, with stage 1 through stage 4 chronic kidney disease, or unspecified chronic kidney disease: Secondary | ICD-10-CM

## 2018-05-10 DIAGNOSIS — E1110 Type 2 diabetes mellitus with ketoacidosis without coma: Secondary | ICD-10-CM

## 2018-05-10 DIAGNOSIS — N400 Enlarged prostate without lower urinary tract symptoms: Secondary | ICD-10-CM

## 2018-05-10 DIAGNOSIS — E559 Vitamin D deficiency, unspecified: Secondary | ICD-10-CM

## 2018-05-10 MED ORDER — METOPROLOL TARTRATE 25 MG PO TABS: 25.0000 mg | ORAL_TABLET | Freq: Two times a day (BID) | ORAL | 1 refills | Status: DC

## 2018-05-10 MED ORDER — ASPIRIN 81 MG PO TBEC: 81.0000 mg | DELAYED_RELEASE_TABLET | Freq: Every day | ORAL | 12 refills | Status: DC

## 2018-05-10 MED ORDER — ATORVASTATIN CALCIUM 80 MG PO TABS: 80.0000 mg | ORAL_TABLET | Freq: Every day | ORAL | 1 refills | Status: DC

## 2018-05-10 MED ORDER — ALLOPURINOL 100 MG PO TABS: 50.0000 mg | ORAL_TABLET | Freq: Every day | ORAL | 1 refills | Status: DC

## 2018-05-10 MED ORDER — INSULIN DETEMIR 100 UNIT/ML ~~LOC~~ SOLN: 20.0000 [IU] | Freq: Every day | SUBCUTANEOUS | 0 refills | Status: DC

## 2018-05-10 MED ORDER — LOSARTAN POTASSIUM 25 MG PO TABS: 25.0000 mg | ORAL_TABLET | Freq: Every day | ORAL | 3 refills | Status: DC

## 2018-05-10 MED ORDER — HYDRALAZINE HCL 100 MG PO TABS: 100.0000 mg | ORAL_TABLET | Freq: Two times a day (BID) | ORAL | 1 refills | Status: DC

## 2018-05-10 NOTE — Assessment & Plan Note (Addendum)
Will check labs and vitals. Appears to be having a reaction to lisinopril- will change to losartan. Await results. Call with any concerns. Continue to monitor.

## 2018-05-10 NOTE — Progress Notes (Signed)
There were no vitals taken for this visit.   Subjective:    Patient ID: Anthony Ouch., male    DOB: 1960/03/08, 58 y.o.   MRN: [id number]  HPI: Anthony Bowlby. is a 58 y.o. male  Chief Complaint  Patient presents with  . Diabetes  . Hypertension  . Hyperlipidemia   HYPERTENSION / HYPERLIPIDEMIA Satisfied with current treatment? yes Duration of hypertension: chronic BP monitoring frequency: not checking BP range: unknown BP medication side effects: yes-  Itchy rash since starting lisinopril Past BP meds: metoprolol, hydralazine, lisinopril Duration of hyperlipidemia: chronic Cholesterol medication side effects: no Cholesterol supplements: none Past cholesterol medications: atorvastatin Medication compliance: excellent compliance Aspirin: no Recent stressors: yes Recurrent headaches: no Visual changes: no Palpitations: no Dyspnea: no Chest pain: no Lower extremity edema: no Dizzy/lightheaded: no  DIABETES Hypoglycemic episodes:no Polydipsia/polyuria: no Visual disturbance: no Chest pain: no Paresthesias: no Glucose Monitoring: yes  Accucheck frequency: daily  Fasting glucose: 120s Taking Insulin?: yes Blood Pressure Monitoring: not checking Retinal Examination: Not up to Date Foot Exam: Not up to Date Diabetic Education: Not Completed Pneumovax: Up to Date Influenza: Up to Date Aspirin: no  Continues with occasional foot pain, but doing a lot better.   Relevant past medical, surgical, family and social history reviewed and updated as indicated. Interim medical history since our last visit reviewed. Allergies and medications reviewed and updated.  Review of Systems  Constitutional: Negative.   Respiratory: Negative.   Cardiovascular: Negative.   Gastrointestinal: Negative.   Musculoskeletal: Positive for arthralgias. Negative for back pain, gait problem, joint swelling, myalgias, neck pain and neck stiffness.  Skin: Negative.    Neurological: Negative.   Psychiatric/Behavioral: Negative.     Per HPI unless specifically indicated above     Objective:    There were no vitals taken for this visit.  Wt Readings from Last 3 Encounters:  03/21/18 224 lb 8 oz (101.8 kg)  03/20/18 224 lb 7 oz (101.8 kg)  03/15/18 224 lb 8 oz (101.8 kg)    Physical Exam Vitals signs and nursing note reviewed.  Constitutional:      General: He is not in acute distress.    Appearance: Normal appearance. He is not ill-appearing, toxic-appearing or diaphoretic.  HENT:     Head: Normocephalic and atraumatic.     Right Ear: External ear normal.     Left Ear: External ear normal.     Nose: Nose normal.     Mouth/Throat:     Mouth: Mucous membranes are moist.     Pharynx: Oropharynx is clear.  Eyes:     General: No scleral icterus.       Right eye: No discharge.        Left eye: No discharge.     Conjunctiva/sclera: Conjunctivae normal.     Pupils: Pupils are equal, round, and reactive to light.  Neck:     Musculoskeletal: Normal range of motion.  Pulmonary:     Effort: Pulmonary effort is normal. No respiratory distress.     Comments: Speaking in full sentences Musculoskeletal: Normal range of motion.  Skin:    Coloration: Skin is not jaundiced or pale.     Findings: No bruising, erythema, lesion or rash.  Neurological:     Mental Status: He is alert and oriented to person, place, and time. Mental status is at baseline.  Psychiatric:        Mood and Affect: Mood normal.  Behavior: Behavior normal.        Thought Content: Thought content normal.        Judgment: Judgment normal.     Results for orders placed or performed in visit on 03/26/18  HM DIABETES EYE EXAM  Result Value Ref Range   HM Diabetic Eye Exam Retinopathy (A) No Retinopathy      Assessment & Plan:   Problem List Items Addressed This Visit      Cardiovascular and Mediastinum   Hypertensive heart/kidney disease without HF and with CKD stage  III ([place]) - Primary    Will check labs and vitals. Appears to be having a reaction to lisinopril- will change to losartan. Await results. Call with any concerns. Continue to monitor.       Relevant Medications   losartan (COZAAR) 25 MG tablet   aspirin 81 MG EC tablet   atorvastatin (LIPITOR) 80 MG tablet   hydrALAZINE (APRESOLINE) 100 MG tablet   metoprolol tartrate (LOPRESSOR) 25 MG tablet   Other Relevant Orders   CBC with Differential/Platelet   Comprehensive metabolic panel   Microalbumin, Urine Waived   TSH   UA/M w/rflx Culture, Routine     Endocrine   Type 2 diabetes with nephropathy ([place])    Will check labs. Await results. Call with any concerns. Continue to monitor.       Relevant Medications   losartan (COZAAR) 25 MG tablet   aspirin 81 MG EC tablet   atorvastatin (LIPITOR) 80 MG tablet   insulin detemir (LEVEMIR) 100 UNIT/ML injection   Other Relevant Orders   Bayer DCA Hb A1c Waived   CBC with Differential/Platelet   Comprehensive metabolic panel   Microalbumin, Urine Waived   TSH   UA/M w/rflx Culture, Routine     Musculoskeletal and Integument   Acute gout due to renal impairment involving toe of right foot    Stable. Continue to monitor. Rechecking labs shortly. Call with any concerns.       Relevant Medications   allopurinol (ZYLOPRIM) 100 MG tablet   aspirin 81 MG EC tablet   Other Relevant Orders   CBC with Differential/Platelet   Comprehensive metabolic panel   TSH   UA/M w/rflx Culture, Routine   Uric acid     Genitourinary   Chronic kidney disease, stage 3 (moderate) (HCC)    Will check labs. Await results. Call with any concerns. Continue to monitor.       Relevant Orders   CBC with Differential/Platelet   Comprehensive metabolic panel   TSH   UA/M w/rflx Culture, Routine     Other   Hyperlipidemia    Will check labs. Await results. Call with any concerns. Continue to monitor.       Relevant Medications   losartan (COZAAR) 25  MG tablet   aspirin 81 MG EC tablet   atorvastatin (LIPITOR) 80 MG tablet   hydrALAZINE (APRESOLINE) 100 MG tablet   metoprolol tartrate (LOPRESSOR) 25 MG tablet   Other Relevant Orders   CBC with Differential/Platelet   Comprehensive metabolic panel   Lipid Panel w/o Chol/HDL Ratio   TSH   UA/M w/rflx Culture, Routine   Vitamin D deficiency    Will check labs. Await results. Call with any concerns. Continue to monitor.       Relevant Orders   CBC with Differential/Platelet   Comprehensive metabolic panel   TSH   UA/M w/rflx Culture, Routine   VITAMIN D 25 Hydroxy (Vit-D Deficiency, Fractures)   Enlarged  prostate    Will check labs. Await results. Call with any concerns. Continue to monitor.       Relevant Orders   CBC with Differential/Platelet   Comprehensive metabolic panel   PSA   TSH   UA/M w/rflx Culture, Routine       Follow up plan: Return in about 4 weeks (around 06/07/2018) for follow up BP.   Marland Kitchen This visit was completed via FaceTime due to the restrictions of the COVID-19 pandemic. All issues as above were discussed and addressed. Physical exam was done as above through visual confirmation on FaceTime. If it was felt that the patient should be evaluated in the office, they were directed there. The patient verbally consented to this visit. . Location of the patient: parking lot . Location of the provider: work . Those involved with this call:  . Provider: Park Liter, DO . CMA: Tiffany Reel, CMA . Front Desk/Registration: Don Perking  . Time spent on call: 25 minutes with patient face to face via video conference. More than 50% of this time was spent in counseling and coordination of care. 40 minutes total spent in review of patient's record and preparation of their chart.

## 2018-05-10 NOTE — Assessment & Plan Note (Signed)
Will check labs. Await results. Call with any concerns. Continue to monitor.  

## 2018-05-10 NOTE — Assessment & Plan Note (Signed)
Stable. Continue to monitor. Rechecking labs shortly. Call with any concerns.

## 2018-05-10 NOTE — Assessment & Plan Note (Addendum)
Will check labs. Await results. Call with any concerns. Continue to monitor.  

## 2018-05-28 ENCOUNTER — Other Ambulatory Visit: Payer: Self-pay

## 2018-05-28 ENCOUNTER — Other Ambulatory Visit: Payer: 59

## 2018-05-28 DIAGNOSIS — N400 Enlarged prostate without lower urinary tract symptoms: Secondary | ICD-10-CM

## 2018-05-28 DIAGNOSIS — I1310 Hypertensive heart and chronic kidney disease without heart failure, with stage 1 through stage 4 chronic kidney disease, or unspecified chronic kidney disease: Secondary | ICD-10-CM

## 2018-05-28 DIAGNOSIS — N183 Chronic kidney disease, stage 3 unspecified: Secondary | ICD-10-CM

## 2018-05-28 DIAGNOSIS — E1121 Type 2 diabetes mellitus with diabetic nephropathy: Secondary | ICD-10-CM

## 2018-05-28 DIAGNOSIS — M10371 Gout due to renal impairment, right ankle and foot: Secondary | ICD-10-CM

## 2018-05-28 DIAGNOSIS — E559 Vitamin D deficiency, unspecified: Secondary | ICD-10-CM

## 2018-05-28 DIAGNOSIS — E782 Mixed hyperlipidemia: Secondary | ICD-10-CM

## 2018-05-28 LAB — MICROSCOPIC EXAMINATION
Bacteria, UA: NONE SEEN
RBC, Urine: NONE SEEN /hpf (ref 0–2)

## 2018-05-28 LAB — UA/M W/RFLX CULTURE, ROUTINE
Bilirubin, UA: NEGATIVE
Glucose, UA: NEGATIVE
Ketones, UA: NEGATIVE
Leukocytes,UA: NEGATIVE
Nitrite, UA: NEGATIVE
RBC, UA: NEGATIVE
Specific Gravity, UA: 1.025 (ref 1.005–1.030)
Urobilinogen, Ur: 0.2 mg/dL (ref 0.2–1.0)
pH, UA: 5 (ref 5.0–7.5)

## 2018-05-28 LAB — MICROALBUMIN, URINE WAIVED
Creatinine, Urine Waived: 100 mg/dL (ref 10–300)
Microalb, Ur Waived: 150 mg/L — ABNORMAL HIGH (ref 0–19)
Microalb/Creat Ratio: >300 mg/g — ABNORMAL HIGH (ref <30)

## 2018-05-28 LAB — BAYER DCA HB A1C WAIVED: HB A1C (BAYER DCA - WAIVED): 6.3 % (ref <7.0)

## 2018-05-29 LAB — LIPID PANEL W/O CHOL/HDL RATIO
Cholesterol, Total: 111 mg/dL (ref 100–199)
HDL: 33 mg/dL — ABNORMAL LOW (ref >39)
LDL Calculated: 53 mg/dL (ref 0–99)
Triglycerides: 126 mg/dL (ref 0–149)
VLDL Cholesterol Cal: 25 mg/dL (ref 5–40)

## 2018-05-29 LAB — CBC WITH DIFFERENTIAL/PLATELET
Basophils Absolute: 0 10*3/uL (ref 0.0–0.2)
Basos: 1 %
EOS (ABSOLUTE): 0.4 10*3/uL (ref 0.0–0.4)
Eos: 6 %
Hematocrit: 42.4 % (ref 37.5–51.0)
Hemoglobin: 13.2 g/dL (ref 13.0–17.7)
Immature Grans (Abs): 0 10*3/uL (ref 0.0–0.1)
Immature Granulocytes: 0 %
Lymphocytes Absolute: 1.5 10*3/uL (ref 0.7–3.1)
Lymphs: 19 %
MCH: 25.6 pg — ABNORMAL LOW (ref 26.6–33.0)
MCHC: 31.1 g/dL — ABNORMAL LOW (ref 31.5–35.7)
MCV: 82 fL (ref 79–97)
Monocytes Absolute: 0.7 10*3/uL (ref 0.1–0.9)
Monocytes: 9 %
Neutrophils Absolute: 5.1 10*3/uL (ref 1.4–7.0)
Neutrophils: 65 %
Platelets: 260 10*3/uL (ref 150–450)
RBC: 5.15 x10E6/uL (ref 4.14–5.80)
RDW: 14 % (ref 11.6–15.4)
WBC: 7.8 10*3/uL (ref 3.4–10.8)

## 2018-05-29 LAB — URIC ACID: Uric Acid: 7.9 mg/dL (ref 3.7–8.6)

## 2018-05-29 LAB — COMPREHENSIVE METABOLIC PANEL
ALT: 23 IU/L (ref 0–44)
AST: 24 IU/L (ref 0–40)
Albumin/Globulin Ratio: 1.7 (ref 1.2–2.2)
Albumin: 4.2 g/dL (ref 3.8–4.9)
Alkaline Phosphatase: 76 IU/L (ref 39–117)
BUN/Creatinine Ratio: 19 (ref 9–20)
BUN: 37 mg/dL — ABNORMAL HIGH (ref 6–24)
Bilirubin Total: 0.3 mg/dL (ref 0.0–1.2)
CO2: 19 mmol/L — ABNORMAL LOW (ref 20–29)
Calcium: 9.2 mg/dL (ref 8.7–10.2)
Chloride: 106 mmol/L (ref 96–106)
Creatinine, Ser: 1.95 mg/dL — ABNORMAL HIGH (ref 0.76–1.27)
GFR calc Af Amer: 43 mL/min/{1.73_m2} — ABNORMAL LOW (ref >59)
GFR calc non Af Amer: 37 mL/min/{1.73_m2} — ABNORMAL LOW (ref >59)
Globulin, Total: 2.5 g/dL (ref 1.5–4.5)
Glucose: 75 mg/dL (ref 65–99)
Potassium: 4 mmol/L (ref 3.5–5.2)
Sodium: 140 mmol/L (ref 134–144)
Total Protein: 6.7 g/dL (ref 6.0–8.5)

## 2018-05-29 LAB — TSH: TSH: 1.31 u[IU]/mL (ref 0.450–4.500)

## 2018-05-29 LAB — VITAMIN D 25 HYDROXY (VIT D DEFICIENCY, FRACTURES): Vit D, 25-Hydroxy: 15.7 ng/mL — ABNORMAL LOW (ref 30.0–100.0)

## 2018-05-29 LAB — PSA: Prostate Specific Ag, Serum: 1.3 ng/mL (ref 0.0–4.0)

## 2018-06-04 ENCOUNTER — Telehealth: Payer: Self-pay | Admitting: Family Medicine

## 2018-06-04 DIAGNOSIS — N289 Disorder of kidney and ureter, unspecified: Secondary | ICD-10-CM

## 2018-06-04 MED ORDER — VITAMIN D (ERGOCALCIFEROL) 1.25 MG (50000 UNIT) PO CAPS: 50000.0000 [IU] | ORAL_CAPSULE | ORAL | 0 refills | Status: DC

## 2018-06-04 NOTE — Telephone Encounter (Signed)
Please let him know that his labs look pretty good. His vitamin D is very low, so I've sent him through a supplement for that. His kidney function is also up just a little bit, so I want him to drink a whole lot of water and come back for a recheck next week. Thanks!

## 2018-06-04 NOTE — Telephone Encounter (Signed)
Called and left detailed message with information on patients vm. DPR was checked.   

## 2018-06-25 ENCOUNTER — Telehealth: Payer: Self-pay | Admitting: Family Medicine

## 2018-06-25 NOTE — Telephone Encounter (Signed)
Called and spoke to patients wife she has two things: #1 Can you update the allopurinol prescription, since he was increased to take 1 tablet daily instead of 1/2 #2 Is he to be taking chlorthalidone, if so was there a medication that he was to stop before taking it ( patient mentioned something about taking this, but stoping another medication first) wife just wants to verify what needs to be done, he is not cureently taking it, she has got it at the house.

## 2018-06-25 NOTE — Telephone Encounter (Signed)
Copied from [place] [id number]. Topic: Quick Communication - Rx Refill/Question >> Jun 25, 2018 12:22 PM Rayann Heman wrote: Medication: allopurinol (ZYLOPRIM) 100 MG tablet [[id number]]  pt wife called and stated that the pt is taking a whole tablet instead of a 1/2 tablet. Pt wife states that she would like the RX to be sent in if possible for insurance issues.  Pt wife would like a call back from the nurse to go over medications.

## 2018-06-27 ENCOUNTER — Other Ambulatory Visit: Payer: Self-pay | Admitting: Family Medicine

## 2018-06-27 MED ORDER — ALLOPURINOL 100 MG PO TABS: 100.0000 mg | ORAL_TABLET | Freq: Every day | ORAL | 1 refills | Status: DC

## 2018-06-27 NOTE — Telephone Encounter (Signed)
He should not be on chlorthalidone. He has been off of that for a while- it increases his risk of gout flares, so do not restart it. If he has it, he should throw it out. I have changed the Rx so it should be all set at the pharmacy for them.

## 2018-06-27 NOTE — Telephone Encounter (Signed)
Spoke with wife and message was relayed.

## 2018-06-27 NOTE — Telephone Encounter (Signed)
Wife following up on this message.  Advised Dr Wynetta Emery was not in the office yesterday, and would not have gotten until today.

## 2018-07-20 ENCOUNTER — Other Ambulatory Visit: Payer: Self-pay | Admitting: Family Medicine

## 2018-07-22 ENCOUNTER — Telehealth: Payer: Self-pay | Admitting: Family Medicine

## 2018-07-22 NOTE — Telephone Encounter (Signed)
Called and spoke with patient. Message relayed. Scheduled for 08/05/18 at 8:30 am

## 2018-07-22 NOTE — Telephone Encounter (Signed)
That is correct. He also needs a follow up on his blood pressure.

## 2018-07-22 NOTE — Telephone Encounter (Signed)
Dr. Wynetta Emery, will you double check behind me on this...ibuprofen looked through the patient's chart. Lisinopril was D/C in April by you for a possible reaction and changed to Losartan so patient is not currently supposed to be taking this correct?

## 2018-07-22 NOTE — Telephone Encounter (Signed)
p't's's wife want to know why pt's lisinoprol was denied.

## 2018-08-04 NOTE — Progress Notes (Signed)
BP (!) 200/100 (BP Location: Left Arm, Cuff Size: Normal)   Pulse 76   Temp 98.4 F (36.9 C) (Oral)   Ht 5\' 6"  (1.676 m)   Wt 228 lb (103.4 kg)   SpO2 96%   BMI 36.80 kg/m    Subjective:    Patient ID: Anthony Ouch., male    DOB: 1960-05-30, 58 y.o.   MRN: [id number]  HPI: Anthony Siek. is a 58 y.o. male  Chief Complaint  Patient presents with  . Follow-up  . Hypertension  . Diabetes   HYPERTENSION- did not take his blood pressure medicine yesterday Hypertension status: uncontrolled  Satisfied with current treatment? no Duration of hypertension: chronic BP monitoring frequency:  not checking BP medication side effects:  no Medication compliance: fair compliance Previous BP meds: Aspirin: yes Recurrent headaches: no Visual changes: no Palpitations: no Dyspnea: no Chest pain: no Lower extremity edema: no Dizzy/lightheaded: no  DIABETES Hypoglycemic episodes:no Polydipsia/polyuria: yes Visual disturbance: no Chest pain: no Paresthesias: no Glucose Monitoring: yes  Accucheck frequency: Daily  Fasting glucose: 175 Taking Insulin?: yes Blood Pressure Monitoring: not checking Retinal Examination: Up to Date Foot Exam: Up to Date Diabetic Education: Completed Pneumovax: Up to Date Influenza: Up to Date Aspirin: yes  Relevant past medical, surgical, family and social history reviewed and updated as indicated. Interim medical history since our last visit reviewed. Allergies and medications reviewed and updated.  Review of Systems  Constitutional: Negative.   Respiratory: Negative.   Cardiovascular: Negative.   Gastrointestinal: Negative.   Musculoskeletal: Negative.   Skin: Positive for rash. Negative for color change, pallor and wound.  Neurological: Negative.   Psychiatric/Behavioral: Negative.     Per HPI unless specifically indicated above     Objective:    BP (!) 200/100 (BP Location: Left Arm, Cuff Size: Normal)   Pulse 76    Temp 98.4 F (36.9 C) (Oral)   Ht 5\' 6"  (1.676 m)   Wt 228 lb (103.4 kg)   SpO2 96%   BMI 36.80 kg/m   Wt Readings from Last 3 Encounters:  08/05/18 228 lb (103.4 kg)  03/21/18 224 lb 8 oz (101.8 kg)  03/20/18 224 lb 7 oz (101.8 kg)    Physical Exam Vitals signs and nursing note reviewed.  Constitutional:      General: He is not in acute distress.    Appearance: Normal appearance. He is not ill-appearing, toxic-appearing or diaphoretic.  HENT:     Head: Normocephalic and atraumatic.     Right Ear: External ear normal.     Left Ear: External ear normal.     Nose: Nose normal.     Mouth/Throat:     Mouth: Mucous membranes are moist.     Pharynx: Oropharynx is clear.  Eyes:     General: No scleral icterus.       Right eye: No discharge.        Left eye: No discharge.     Extraocular Movements: Extraocular movements intact.     Conjunctiva/sclera: Conjunctivae normal.     Pupils: Pupils are equal, round, and reactive to light.  Neck:     Musculoskeletal: Normal range of motion and neck supple.  Cardiovascular:     Rate and Rhythm: Normal rate and regular rhythm.     Pulses: Normal pulses.     Heart sounds: Normal heart sounds. No murmur. No friction rub. No gallop.   Pulmonary:     Effort: Pulmonary effort is  normal. No respiratory distress.     Breath sounds: Normal breath sounds. No stridor. No wheezing, rhonchi or rales.  Chest:     Chest wall: No tenderness.  Musculoskeletal: Normal range of motion.  Skin:    General: Skin is warm and dry.     Capillary Refill: Capillary refill takes less than 2 seconds.     Coloration: Skin is not jaundiced or pale.     Findings: No bruising, erythema, lesion or rash.  Neurological:     General: No focal deficit present.     Mental Status: He is alert and oriented to person, place, and time. Mental status is at baseline.  Psychiatric:        Mood and Affect: Mood normal.        Behavior: Behavior normal.        Thought Content:  Thought content normal.        Judgment: Judgment normal.     Results for orders placed or performed in visit on 05/28/18  Microscopic Examination   URINE  Result Value Ref Range   WBC, UA 0-5 0 - 5 /hpf   RBC None seen 0 - 2 /hpf   Epithelial Cells (non renal) 0-10 0 - 10 /hpf   Bacteria, UA None seen None seen/Few  VITAMIN D 25 Hydroxy (Vit-D Deficiency, Fractures)  Result Value Ref Range   Vit D, 25-Hydroxy 15.7 (L) 30.0 - 100.0 ng/mL  Uric acid  Result Value Ref Range   Uric Acid 7.9 3.7 - 8.6 mg/dL  UA/M w/rflx Culture, Routine   Specimen: Urine   URINE  Result Value Ref Range   Specific Gravity, UA 1.025 1.005 - 1.030   pH, UA 5.0 5.0 - 7.5   Color, UA Yellow Yellow   Appearance Ur Clear Clear   Leukocytes,UA Negative Negative   Protein,UA 3+ (A) Negative/Trace   Glucose, UA Negative Negative   Ketones, UA Negative Negative   RBC, UA Negative Negative   Bilirubin, UA Negative Negative   Urobilinogen, Ur 0.2 0.2 - 1.0 mg/dL   Nitrite, UA Negative Negative   Microscopic Examination See below:   TSH  Result Value Ref Range   TSH 1.310 0.450 - 4.500 uIU/mL  PSA  Result Value Ref Range   Prostate Specific Ag, Serum 1.3 0.0 - 4.0 ng/mL  Microalbumin, Urine Waived  Result Value Ref Range   Microalb, Ur Waived 150 (H) 0 - 19 mg/L   Creatinine, Urine Waived 100 10 - 300 mg/dL   Microalb/Creat Ratio >300 (H) <30 mg/g  Lipid Panel w/o Chol/HDL Ratio  Result Value Ref Range   Cholesterol, Total 111 100 - 199 mg/dL   Triglycerides 126 0 - 149 mg/dL   HDL 33 (L) >39 mg/dL   VLDL Cholesterol Cal 25 5 - 40 mg/dL   LDL Calculated 53 0 - 99 mg/dL  Comprehensive metabolic panel  Result Value Ref Range   Glucose 75 65 - 99 mg/dL   BUN 37 (H) 6 - 24 mg/dL   Creatinine, Ser 1.95 (H) 0.76 - 1.27 mg/dL   GFR calc non Af Amer 37 (L) >59 mL/min/1.73   GFR calc Af Amer 43 (L) >59 mL/min/1.73   BUN/Creatinine Ratio 19 9 - 20   Sodium 140 134 - 144 mmol/L   Potassium 4.0 3.5 -  5.2 mmol/L   Chloride 106 96 - 106 mmol/L   CO2 19 (L) 20 - 29 mmol/L   Calcium 9.2 8.7 - 10.2 mg/dL  Total Protein 6.7 6.0 - 8.5 g/dL   Albumin 4.2 3.8 - 4.9 g/dL   Globulin, Total 2.5 1.5 - 4.5 g/dL   Albumin/Globulin Ratio 1.7 1.2 - 2.2   Bilirubin Total 0.3 0.0 - 1.2 mg/dL   Alkaline Phosphatase 76 39 - 117 IU/L   AST 24 0 - 40 IU/L   ALT 23 0 - 44 IU/L  CBC with Differential/Platelet  Result Value Ref Range   WBC 7.8 3.4 - 10.8 x10E3/uL   RBC 5.15 4.14 - 5.80 x10E6/uL   Hemoglobin 13.2 13.0 - 17.7 g/dL   Hematocrit 42.4 37.5 - 51.0 %   MCV 82 79 - 97 fL   MCH 25.6 (L) 26.6 - 33.0 pg   MCHC 31.1 (L) 31.5 - 35.7 g/dL   RDW 14.0 11.6 - 15.4 %   Platelets 260 150 - 450 x10E3/uL   Neutrophils 65 Not Estab. %   Lymphs 19 Not Estab. %   Monocytes 9 Not Estab. %   Eos 6 Not Estab. %   Basos 1 Not Estab. %   Neutrophils Absolute 5.1 1.4 - 7.0 x10E3/uL   Lymphocytes Absolute 1.5 0.7 - 3.1 x10E3/uL   Monocytes Absolute 0.7 0.1 - 0.9 x10E3/uL   EOS (ABSOLUTE) 0.4 0.0 - 0.4 x10E3/uL   Basophils Absolute 0.0 0.0 - 0.2 x10E3/uL   Immature Granulocytes 0 Not Estab. %   Immature Grans (Abs) 0.0 0.0 - 0.1 x10E3/uL  Bayer DCA Hb A1c Waived  Result Value Ref Range   HB A1C (BAYER DCA - WAIVED) 6.3 <7.0 %      Assessment & Plan:   Problem List Items Addressed This Visit      Cardiovascular and Mediastinum   Hypertensive heart/kidney disease without HF and with CKD stage III ([place]) - Primary    Not under good control. Will increase his losartan, continue hydralazine and metoprolol. Recheck 2 weeks.       Relevant Medications   losartan (COZAAR) 50 MG tablet   Other Relevant Orders   Basic metabolic panel     Endocrine   Type 2 diabetes with nephropathy ([facility])    Rechecking BMP and A1c today- will adjust medication as needed. Would prefer to get him on orals rather than insulin. Await renal function. Doing well with A1c of 6.8.       Relevant Medications   losartan (COZAAR)  50 MG tablet   Other Relevant Orders   Bayer DCA Hb A1c Waived     Genitourinary   Chronic kidney disease, stage 3 (moderate) ([facility])    Rechecking levels today- will determine if we can start metformin. Await results. Adjust medicine as needed.           Follow up plan: Return in about 2 weeks (around 08/19/2018) for Follow up BP.

## 2018-08-05 ENCOUNTER — Encounter: Payer: Self-pay | Admitting: Family Medicine

## 2018-08-05 ENCOUNTER — Ambulatory Visit (INDEPENDENT_AMBULATORY_CARE_PROVIDER_SITE_OTHER): Payer: BC Managed Care – PPO | Admitting: Family Medicine

## 2018-08-05 ENCOUNTER — Other Ambulatory Visit: Payer: Self-pay

## 2018-08-05 ENCOUNTER — Other Ambulatory Visit: Payer: Self-pay | Admitting: Family Medicine

## 2018-08-05 VITALS — BP 200/100 | HR 76 | Temp 98.4°F | Ht 66.0 in | Wt 228.0 lb

## 2018-08-05 DIAGNOSIS — N183 Chronic kidney disease, stage 3 unspecified: Secondary | ICD-10-CM

## 2018-08-05 DIAGNOSIS — E1121 Type 2 diabetes mellitus with diabetic nephropathy: Secondary | ICD-10-CM | POA: Diagnosis not present

## 2018-08-05 DIAGNOSIS — I1310 Hypertensive heart and chronic kidney disease without heart failure, with stage 1 through stage 4 chronic kidney disease, or unspecified chronic kidney disease: Secondary | ICD-10-CM

## 2018-08-05 LAB — BAYER DCA HB A1C WAIVED: HB A1C (BAYER DCA - WAIVED): 6.8 % (ref <7.0)

## 2018-08-05 MED ORDER — LOSARTAN POTASSIUM 50 MG PO TABS: 75.0000 mg | ORAL_TABLET | Freq: Every day | ORAL | 3 refills | Status: DC

## 2018-08-05 NOTE — Assessment & Plan Note (Addendum)
Rechecking BMP and A1c today- will adjust medication as needed. Would prefer to get him on orals rather than insulin. Await renal function. Doing well with A1c of 6.8.

## 2018-08-05 NOTE — Patient Instructions (Addendum)
Please start taking 75mg  of losartan (3 of the 25 that you have at home, and then you can change to 1.5 of the 50mg  I just sent to the pharmacy) Continue your hydralazine and your metoprolol. We'll recheck your blood pressure in 2 weeks. We will call you tomorrow about how your kidneys are doing and hopefully be able to change your diabetes medicine to not be a shot.

## 2018-08-05 NOTE — Telephone Encounter (Signed)
Please see pharmacy note.  

## 2018-08-05 NOTE — Assessment & Plan Note (Signed)
Rechecking levels today- will determine if we can start metformin. Await results. Adjust medicine as needed.

## 2018-08-05 NOTE — Assessment & Plan Note (Signed)
Not under good control. Will increase his losartan, continue hydralazine and metoprolol. Recheck 2 weeks.

## 2018-08-06 LAB — BASIC METABOLIC PANEL
BUN/Creatinine Ratio: 11 (ref 9–20)
BUN: 18 mg/dL (ref 6–24)
CO2: 22 mmol/L (ref 20–29)
Calcium: 9.1 mg/dL (ref 8.7–10.2)
Chloride: 105 mmol/L (ref 96–106)
Creatinine, Ser: 1.66 mg/dL — ABNORMAL HIGH (ref 0.76–1.27)
GFR calc Af Amer: 52 mL/min/{1.73_m2} — ABNORMAL LOW (ref >59)
GFR calc non Af Amer: 45 mL/min/{1.73_m2} — ABNORMAL LOW (ref >59)
Glucose: 133 mg/dL — ABNORMAL HIGH (ref 65–99)
Potassium: 4.4 mmol/L (ref 3.5–5.2)
Sodium: 138 mmol/L (ref 134–144)

## 2018-08-13 ENCOUNTER — Ambulatory Visit (INDEPENDENT_AMBULATORY_CARE_PROVIDER_SITE_OTHER): Payer: BC Managed Care – PPO | Admitting: Family Medicine

## 2018-08-13 ENCOUNTER — Other Ambulatory Visit: Payer: Self-pay

## 2018-08-13 ENCOUNTER — Encounter: Payer: Self-pay | Admitting: Family Medicine

## 2018-08-13 VITALS — BP 192/103 | HR 76 | Temp 98.7°F | Ht 68.5 in | Wt 231.0 lb

## 2018-08-13 DIAGNOSIS — Z1159 Encounter for screening for other viral diseases: Secondary | ICD-10-CM | POA: Diagnosis not present

## 2018-08-13 DIAGNOSIS — Z0000 Encounter for general adult medical examination without abnormal findings: Secondary | ICD-10-CM | POA: Diagnosis not present

## 2018-08-13 DIAGNOSIS — Z114 Encounter for screening for human immunodeficiency virus [HIV]: Secondary | ICD-10-CM

## 2018-08-13 DIAGNOSIS — N183 Chronic kidney disease, stage 3 unspecified: Secondary | ICD-10-CM

## 2018-08-13 DIAGNOSIS — K625 Hemorrhage of anus and rectum: Secondary | ICD-10-CM | POA: Diagnosis not present

## 2018-08-13 DIAGNOSIS — Z1211 Encounter for screening for malignant neoplasm of colon: Secondary | ICD-10-CM

## 2018-08-13 DIAGNOSIS — I1310 Hypertensive heart and chronic kidney disease without heart failure, with stage 1 through stage 4 chronic kidney disease, or unspecified chronic kidney disease: Secondary | ICD-10-CM | POA: Diagnosis not present

## 2018-08-13 LAB — UA/M W/RFLX CULTURE, ROUTINE
Bilirubin, UA: NEGATIVE
Glucose, UA: NEGATIVE
Ketones, UA: NEGATIVE
Leukocytes,UA: NEGATIVE
Nitrite, UA: NEGATIVE
RBC, UA: NEGATIVE
Specific Gravity, UA: 1.025 (ref 1.005–1.030)
Urobilinogen, Ur: 0.2 mg/dL (ref 0.2–1.0)
pH, UA: 5 (ref 5.0–7.5)

## 2018-08-13 LAB — MICROALBUMIN, URINE WAIVED
Creatinine, Urine Waived: 100 mg/dL (ref 10–300)
Microalb, Ur Waived: 150 mg/L — ABNORMAL HIGH (ref 0–19)
Microalb/Creat Ratio: >300 mg/g — ABNORMAL HIGH

## 2018-08-13 LAB — MICROSCOPIC EXAMINATION
Bacteria, UA: NONE SEEN
RBC, Urine: NONE SEEN /hpf (ref 0–2)
WBC, UA: NONE SEEN /hpf (ref 0–5)

## 2018-08-13 MED ORDER — IRBESARTAN 300 MG PO TABS: 300.0000 mg | ORAL_TABLET | Freq: Every day | ORAL | 3 refills | Status: DC

## 2018-08-13 NOTE — Progress Notes (Signed)
BP (!) 192/103   Pulse 76   Temp 98.7 F (37.1 C) (Oral)   Ht 5' 8.5" (1.74 m)   Wt 231 lb (104.8 kg)   SpO2 95%   BMI 34.61 kg/m    Subjective:    Patient ID: Anthony Ouch., male    DOB: 07/21/1960, 58 y.o.   MRN: [id number]  HPI: Anthony Mcphearson. is a 58 y.o. male presenting on 08/13/2018 for comprehensive medical examination. Current medical complaints include:  HYPERTENSION Hypertension status: uncontrolled  Satisfied with current treatment? no Duration of hypertension: chronic BP monitoring frequency:  not checking BP medication side effects:  no Medication compliance: fair compliance Previous BP meds: irbesartan, hydralazine, metoprolol Aspirin: yes Recurrent headaches: no Visual changes: no Palpitations: no Dyspnea: no Chest pain: no Lower extremity edema: no Dizzy/lightheaded: no  He currently lives with: wife Interim Problems from his last visit: no  Depression Screen done today and results listed below:  Depression screen [facility] 2/9 02/21/2018 09/10/2015  Decreased Interest 0 0  Down, Depressed, Hopeless 0 0  PHQ - 2 Score 0 0  Altered sleeping - 0  Tired, decreased energy - 0  Change in appetite - 0  Feeling bad or failure about yourself  - 0  Trouble concentrating - 0  Moving slowly or fidgety/restless - 0  Suicidal thoughts - 0  PHQ-9 Score - 0    Past Medical History:  Past Medical History:  Diagnosis Date  . Diabetes mellitus without complication ([place])   . Enlarged prostate   . Gout   . Hyperlipidemia   . Hypertension   . IFG (impaired fasting glucose)   . Vitamin D deficiency     Surgical History:  History reviewed. No pertinent surgical history.  Medications:  Current Outpatient Medications on File Prior to Visit  Medication Sig  . ACCU-CHEK FASTCLIX LANCETS MISC 1 each by Does not apply route QID.  Marland Kitchen allopurinol (ZYLOPRIM) 100 MG tablet Take 1 tablet (100 mg total) by mouth daily.  Marland Kitchen aspirin 81 MG EC tablet Take 1 tablet  (81 mg total) by mouth daily.  Marland Kitchen atorvastatin (LIPITOR) 80 MG tablet Take 1 tablet (80 mg total) by mouth daily at 6 PM.  . blood glucose meter kit and supplies KIT Dispense based on patient and insurance preference. Use up to four times daily as directed. (FOR ICD-9 250.00, 250.01).  . Blood Glucose Monitoring Suppl (ACCU-CHEK GUIDE) w/Device KIT 1 each by Does not apply route QID.  Marland Kitchen hydrALAZINE (APRESOLINE) 100 MG tablet Take 1 tablet (100 mg total) by mouth 2 (two) times daily.  . indomethacin (INDOCIN SR) 75 MG CR capsule TAKE 1 CAPSULE (75 MG TOTAL) BY MOUTH 2 (TWO) TIMES DAILY WITH A MEAL.  . metoprolol tartrate (LOPRESSOR) 25 MG tablet Take 1 tablet (25 mg total) by mouth 2 (two) times daily.  . Misc Natural Products (TART CHERRY ADVANCED PO) Take by mouth.  . Vitamin D, Ergocalciferol, (DRISDOL) 1.25 MG (50000 UT) CAPS capsule Take 1 capsule (50,000 Units total) by mouth every 7 (seven) days.  . insulin detemir (LEVEMIR) 100 UNIT/ML injection Inject 0.2 mLs (20 Units total) into the skin daily for 30 days.   No current facility-administered medications on file prior to visit.     Allergies:  No Known Allergies  Social History:  Social History   Socioeconomic History  . Marital status: Married    Spouse name: Not on file  . Number of children: Not on file  .  Years of education: Not on file  . Highest education level: Not on file  Occupational History  . Not on file  Social Needs  . Financial resource strain: Not on file  . Food insecurity    Worry: Not on file    Inability: Not on file  . Transportation needs    Medical: Not on file    Non-medical: Not on file  Tobacco Use  . Smoking status: Former Smoker    Packs/day: 0.25    Years: 8.00    Pack years: 2.00    Types: Cigarettes    Quit date: 01/23/2014    Years since quitting: 4.5  . Smokeless tobacco: Never Used  Substance and Sexual Activity  . Alcohol use: Yes    Alcohol/week: 0.0 standard drinks    Comment:  on occasion  . Drug use: No  . Sexual activity: Never  Lifestyle  . Physical activity    Days per week: Not on file    Minutes per session: Not on file  . Stress: Not on file  Relationships  . Social Herbalist on phone: Not on file    Gets together: Not on file    Attends religious service: Not on file    Active member of club or organization: Not on file    Attends meetings of clubs or organizations: Not on file    Relationship status: Not on file  . Intimate partner violence    Fear of current or ex partner: Not on file    Emotionally abused: Not on file    Physically abused: Not on file    Forced sexual activity: Not on file  Other Topics Concern  . Not on file  Social History Narrative  . Not on file   Social History   Tobacco Use  Smoking Status Former Smoker  . Packs/day: 0.25  . Years: 8.00  . Pack years: 2.00  . Types: Cigarettes  . Quit date: 01/23/2014  . Years since quitting: 4.5  Smokeless Tobacco Never Used   Social History   Substance and Sexual Activity  Alcohol Use Yes  . Alcohol/week: 0.0 standard drinks   Comment: on occasion    Family History:  Family History  Problem Relation Age of Onset  . Hypertension Mother   . Diabetes Mother   . Alzheimer's disease Father   . Diabetes Sister   . Hypertension Brother   . Cancer Maternal Grandfather        Prostate  . Cancer Sister        Breast Cancer    Past medical history, surgical history, medications, allergies, family history and social history reviewed with patient today and changes made to appropriate areas of the chart.   Review of Systems  Constitutional: Negative.   HENT: Negative.   Eyes: Negative.   Respiratory: Negative.   Cardiovascular: Negative.   Gastrointestinal: Positive for blood in stool. Negative for abdominal pain, constipation, diarrhea, heartburn, melena, nausea and vomiting.  Genitourinary: Negative.   Musculoskeletal: Negative.   Skin: Positive for  rash. Negative for itching.  Neurological: Negative.   Endo/Heme/Allergies: Negative.   Psychiatric/Behavioral: Negative.     All other ROS negative except what is listed above and in the HPI.      Objective:    BP (!) 192/103   Pulse 76   Temp 98.7 F (37.1 C) (Oral)   Ht 5' 8.5" (1.74 m)   Wt 231 lb (104.8 kg)   SpO2  95%   BMI 34.61 kg/m   Wt Readings from Last 3 Encounters:  08/13/18 231 lb (104.8 kg)  08/05/18 228 lb (103.4 kg)  03/21/18 224 lb 8 oz (101.8 kg)    Physical Exam Vitals signs and nursing note reviewed.  Constitutional:      General: He is not in acute distress.    Appearance: Normal appearance. He is obese. He is not ill-appearing, toxic-appearing or diaphoretic.  HENT:     Head: Normocephalic and atraumatic.     Right Ear: Tympanic membrane, ear canal and external ear normal. There is no impacted cerumen.     Left Ear: Tympanic membrane, ear canal and external ear normal. There is no impacted cerumen.     Nose: Nose normal. No congestion or rhinorrhea.     Mouth/Throat:     Mouth: Mucous membranes are moist.     Pharynx: Oropharynx is clear. No oropharyngeal exudate or posterior oropharyngeal erythema.  Eyes:     General: No scleral icterus.       Right eye: No discharge.        Left eye: No discharge.     Extraocular Movements: Extraocular movements intact.     Conjunctiva/sclera: Conjunctivae normal.     Pupils: Pupils are equal, round, and reactive to light.  Neck:     Musculoskeletal: Normal range of motion and neck supple. No neck rigidity or muscular tenderness.     Vascular: No carotid bruit.  Cardiovascular:     Rate and Rhythm: Normal rate and regular rhythm.     Pulses: Normal pulses.     Heart sounds: No murmur. No friction rub. No gallop.   Pulmonary:     Effort: Pulmonary effort is normal. No respiratory distress.     Breath sounds: Normal breath sounds. No stridor. No wheezing, rhonchi or rales.  Chest:     Chest wall: No  tenderness.  Abdominal:     General: Abdomen is flat. Bowel sounds are normal. There is no distension.     Palpations: Abdomen is soft. There is no mass.     Tenderness: There is no abdominal tenderness. There is no right CVA tenderness, left CVA tenderness, guarding or rebound.     Hernia: No hernia is present.  Genitourinary:    Comments: Genital exam deferred with shared decision making Musculoskeletal:        General: No swelling, tenderness, deformity or signs of injury.     Right lower leg: No edema.     Left lower leg: No edema.  Lymphadenopathy:     Cervical: No cervical adenopathy.  Skin:    General: Skin is warm and dry.     Capillary Refill: Capillary refill takes less than 2 seconds.     Coloration: Skin is not jaundiced or pale.     Findings: No bruising, erythema, lesion or rash.  Neurological:     General: No focal deficit present.     Mental Status: He is alert and oriented to person, place, and time.     Cranial Nerves: No cranial nerve deficit.     Sensory: No sensory deficit.     Motor: No weakness.     Coordination: Coordination normal.     Gait: Gait normal.     Deep Tendon Reflexes: Reflexes normal.  Psychiatric:        Mood and Affect: Mood normal.        Behavior: Behavior normal.        Thought Content:  Thought content normal.        Judgment: Judgment normal.     Results for orders placed or performed in visit on 41/28/78  Basic metabolic panel  Result Value Ref Range   Glucose 133 (H) 65 - 99 mg/dL   BUN 18 6 - 24 mg/dL   Creatinine, Ser 1.66 (H) 0.76 - 1.27 mg/dL   GFR calc non Af Amer 45 (L) >59 mL/min/1.73   GFR calc Af Amer 52 (L) >59 mL/min/1.73   BUN/Creatinine Ratio 11 9 - 20   Sodium 138 134 - 144 mmol/L   Potassium 4.4 3.5 - 5.2 mmol/L   Chloride 105 96 - 106 mmol/L   CO2 22 20 - 29 mmol/L   Calcium 9.1 8.7 - 10.2 mg/dL  Bayer DCA Hb A1c Waived  Result Value Ref Range   HB A1C (BAYER DCA - WAIVED) 6.8 <7.0 %      Assessment &  Plan:   Problem List Items Addressed This Visit      Cardiovascular and Mediastinum   Hypertensive heart/kidney disease without HF and with CKD stage III ([place])    Not under good control. Will increase his irbesartan to 365m and recheck 1-2 weeks. May need to add amlodipine as well. Continue to monitor.       Relevant Medications   irbesartan (AVAPRO) 300 MG tablet   Other Relevant Orders   Microalbumin, Urine Waived     Genitourinary   Chronic kidney disease, stage 3 (moderate) ([facility])    Improved on last check. Would like to switch from insulin to metformin- I think this is reasonable, but I want to get his BP under better control first.        Other Visit Diagnoses    Routine general medical examination at a health care facility    -  Primary   Vaccines up to date. Screening labs checked today. Referral for colonoscopy placed. Continue diet and exercis. Call with any concerns.    Relevant Orders   CBC with Differential/Platelet   Comprehensive metabolic panel   Lipid Panel w/o Chol/HDL Ratio   PSA   TSH   UA/M w/rflx Culture, Routine   Rectal bleeding       FOBT given. Has not had colonoscopy- referral to GI made today.   Relevant Orders   Fecal occult blood, imunochemical([facility])   Ambulatory referral to Gastroenterology   Need for hepatitis C screening test       Labs drawn today. Await results.    Relevant Orders   Hepatitis C Antibody   Screening for HIV without presence of risk factors       Labs drawn today. Await results.    Relevant Orders   HIV Antibody (routine testing w rflx)   Screening for colon cancer       Referral to GI made today.   Relevant Orders   Ambulatory referral to Gastroenterology       Discussed aspirin prophylaxis for myocardial infarction prevention and decision was made to continue ASA  LABORATORY TESTING:  Health maintenance labs ordered today as discussed above.   The natural history of prostate cancer and ongoing  controversy regarding screening and potential treatment outcomes of prostate cancer has been discussed with the patient. The meaning of a false positive PSA and a false negative PSA has been discussed. He indicates understanding of the limitations of this screening test and wishes to proceed with screening PSA testing.   IMMUNIZATIONS:   - Tdap: Tetanus  vaccination status reviewed: last tetanus booster within 10 years. - Influenza: Postponed to flu season - Pneumovax: Up to date - Prevnar: Not applicable  SCREENING: - Colonoscopy: Ordered today  Discussed with patient purpose of the colonoscopy is to detect colon cancer at curable precancerous or early stages   PATIENT COUNSELING:    Sexuality: Discussed sexually transmitted diseases, partner selection, use of condoms, avoidance of unintended pregnancy  and contraceptive alternatives.   Advised to avoid cigarette smoking.   I discussed with the patient that most people either abstain from alcohol or drink within safe limits (<=14/week and <=4 drinks/occasion for males, <=7/weeks and <= 3 drinks/occasion for females) and that the risk for alcohol disorders and other health effects rises proportionally with the number of drinks per week and how often a drinker exceeds daily limits.  Discussed cessation/primary prevention of drug use and availability of treatment for abuse.   Diet: Encouraged to adjust caloric intake to maintain  or achieve ideal body weight, to reduce intake of dietary saturated fat and total fat, to limit sodium intake by avoiding high sodium foods and not adding table salt, and to maintain adequate dietary potassium and calcium preferably from fresh fruits, vegetables, and low-fat dairy products.    stressed the importance of regular exercise  Injury prevention: Discussed safety belts, safety helmets, smoke detector, smoking near bedding or upholstery.   Dental health: Discussed importance of regular tooth brushing,  flossing, and dental visits.   Follow up plan: NEXT PREVENTATIVE PHYSICAL DUE IN 1 YEAR. Return in about 1 week (around 08/20/2018) for BP follow up.

## 2018-08-13 NOTE — Patient Instructions (Addendum)
Your blood pressure is better, but still really high (192/103 today). Continue hydralazine 2x a day and metoprolol. Increase irbesartan from 150 to 300mg  (take 2 of what you've got at home, and I've sent a 300mg  tablet to the pharmacy for you). I'll see you in a week to recheck your blood pressure.   Your kidney function is better (1.66 down from 1.95) but I want to get your blood pressure under a bit better control before we change you from the diabetes shot to the pill.    Health Maintenance, Male Adopting a healthy lifestyle and getting preventive care are important in promoting health and wellness. Ask your health care provider about:  The right schedule for you to have regular tests and exams.  Things you can do on your own to prevent diseases and keep yourself healthy. What should I know about diet, weight, and exercise? Eat a healthy diet   Eat a diet that includes plenty of vegetables, fruits, low-fat dairy products, and lean protein.  Do not eat a lot of foods that are high in solid fats, added sugars, or sodium. Maintain a healthy weight Body mass index (BMI) is a measurement that can be used to identify possible weight problems. It estimates body fat based on height and weight. Your health care provider can help determine your BMI and help you achieve or maintain a healthy weight. Get regular exercise Get regular exercise. This is one of the most important things you can do for your health. Most adults should:  Exercise for at least 150 minutes each week. The exercise should increase your heart rate and make you sweat (moderate-intensity exercise).  Do strengthening exercises at least twice a week. This is in addition to the moderate-intensity exercise.  Spend less time sitting. Even light physical activity can be beneficial. Watch cholesterol and blood lipids Have your blood tested for lipids and cholesterol at 57 years of age, then have this test every 5 years. You may need  to have your cholesterol levels checked more often if:  Your lipid or cholesterol levels are high.  You are older than 57 years of age.  You are at high risk for heart disease. What should I know about cancer screening? Many types of cancers can be detected early and may often be prevented. Depending on your health history and family history, you may need to have cancer screening at various ages. This may include screening for:  Colorectal cancer.  Prostate cancer.  Skin cancer.  Lung cancer. What should I know about heart disease, diabetes, and high blood pressure? Blood pressure and heart disease  High blood pressure causes heart disease and increases the risk of stroke. This is more likely to develop in people who have high blood pressure readings, are of African descent, or are overweight.  Talk with your health care provider about your target blood pressure readings.  Have your blood pressure checked: ? Every 3-5 years if you are 33-40 years of age. ? Every year if you are 37 years old or older.  If you are between the ages of 60 and 34 and are a current or former smoker, ask your health care provider if you should have a one-time screening for abdominal aortic aneurysm (AAA). Diabetes Have regular diabetes screenings. This checks your fasting blood sugar level. Have the screening done:  Once every three years after age 28 if you are at a normal weight and have a low risk for diabetes.  More often and  at a younger age if you are overweight or have a high risk for diabetes. What should I know about preventing infection? Hepatitis B If you have a higher risk for hepatitis B, you should be screened for this virus. Talk with your health care provider to find out if you are at risk for hepatitis B infection. Hepatitis C Blood testing is recommended for:  Everyone born from [birth year] through 1964.  Anyone with known risk factors for hepatitis C. Sexually transmitted infections  (STIs)  You should be screened each year for STIs, including gonorrhea and chlamydia, if: ? You are sexually active and are younger than 57 years of age. ? You are older than 57 years of age and your health care provider tells you that you are at risk for this type of infection. ? Your sexual activity has changed since you were last screened, and you are at increased risk for chlamydia or gonorrhea. Ask your health care provider if you are at risk.  Ask your health care provider about whether you are at high risk for HIV. Your health care provider may recommend a prescription medicine to help prevent HIV infection. If you choose to take medicine to prevent HIV, you should first get tested for HIV. You should then be tested every 3 months for as long as you are taking the medicine. Follow these instructions at home: Lifestyle  Do not use any products that contain nicotine or tobacco, such as cigarettes, e-cigarettes, and chewing tobacco. If you need help quitting, ask your health care provider.  Do not use street drugs.  Do not share needles.  Ask your health care provider for help if you need support or information about quitting drugs. Alcohol use  Do not drink alcohol if your health care provider tells you not to drink.  If you drink alcohol: ? Limit how much you have to 0-2 drinks a day. ? Be aware of how much alcohol is in your drink. In the [place] one drink equals one 12 oz bottle of beer (355 mL), one 5 oz glass of wine (148 mL), or one 1 oz glass of hard liquor (44 mL). General instructions  Schedule regular health, dental, and eye exams.  Stay current with your vaccines.  Tell your health care provider if: ? You often feel depressed. ? You have ever been abused or do not feel safe at home. Summary  Adopting a healthy lifestyle and getting preventive care are important in promoting health and wellness.  Follow your health care provider's instructions about healthy diet,  exercising, and getting tested or screened for diseases.  Follow your health care provider's instructions on monitoring your cholesterol and blood pressure. This information is not intended to replace advice given to you by your health care provider. Make sure you discuss any questions you have with your health care provider. Document Released: 07/08/2007 Document Revised: 01/02/2018 Document Reviewed: 01/02/2018 Elsevier Patient Education  2020 [employer].

## 2018-08-13 NOTE — Assessment & Plan Note (Signed)
Improved on last check. Would like to switch from insulin to metformin- I think this is reasonable, but I want to get his BP under better control first.

## 2018-08-13 NOTE — Assessment & Plan Note (Signed)
Not under good control. Will increase his irbesartan to 300mg  and recheck 1-2 weeks. May need to add amlodipine as well. Continue to monitor.

## 2018-08-14 LAB — LIPID PANEL W/O CHOL/HDL RATIO
Cholesterol, Total: 147 mg/dL (ref 100–199)
HDL: 37 mg/dL — ABNORMAL LOW (ref >39)
LDL Calculated: 85 mg/dL (ref 0–99)
Triglycerides: 127 mg/dL (ref 0–149)
VLDL Cholesterol Cal: 25 mg/dL (ref 5–40)

## 2018-08-14 LAB — CBC WITH DIFFERENTIAL/PLATELET
Basophils Absolute: 0.1 10*3/uL (ref 0.0–0.2)
Basos: 1 %
EOS (ABSOLUTE): 0.4 10*3/uL (ref 0.0–0.4)
Eos: 5 %
Hematocrit: 45.8 % (ref 37.5–51.0)
Hemoglobin: 14.6 g/dL (ref 13.0–17.7)
Immature Grans (Abs): 0 10*3/uL (ref 0.0–0.1)
Immature Granulocytes: 0 %
Lymphocytes Absolute: 1.1 10*3/uL (ref 0.7–3.1)
Lymphs: 15 %
MCH: 25.3 pg — ABNORMAL LOW (ref 26.6–33.0)
MCHC: 31.9 g/dL (ref 31.5–35.7)
MCV: 79 fL (ref 79–97)
Monocytes Absolute: 0.5 10*3/uL (ref 0.1–0.9)
Monocytes: 6 %
Neutrophils Absolute: 5.5 10*3/uL (ref 1.4–7.0)
Neutrophils: 73 %
Platelets: 236 10*3/uL (ref 150–450)
RBC: 5.78 x10E6/uL (ref 4.14–5.80)
RDW: 15 % (ref 11.6–15.4)
WBC: 7.5 10*3/uL (ref 3.4–10.8)

## 2018-08-14 LAB — COMPREHENSIVE METABOLIC PANEL
ALT: 27 IU/L (ref 0–44)
AST: 23 IU/L (ref 0–40)
Albumin/Globulin Ratio: 1.4 (ref 1.2–2.2)
Albumin: 3.9 g/dL (ref 3.8–4.9)
Alkaline Phosphatase: 80 IU/L (ref 39–117)
BUN/Creatinine Ratio: 17 (ref 9–20)
BUN: 26 mg/dL — ABNORMAL HIGH (ref 6–24)
Bilirubin Total: 0.3 mg/dL (ref 0.0–1.2)
CO2: 18 mmol/L — ABNORMAL LOW (ref 20–29)
Calcium: 9.1 mg/dL (ref 8.7–10.2)
Chloride: 104 mmol/L (ref 96–106)
Creatinine, Ser: 1.49 mg/dL — ABNORMAL HIGH (ref 0.76–1.27)
GFR calc Af Amer: 60 mL/min/{1.73_m2} (ref >59)
GFR calc non Af Amer: 52 mL/min/{1.73_m2} — ABNORMAL LOW (ref >59)
Globulin, Total: 2.7 g/dL (ref 1.5–4.5)
Glucose: 162 mg/dL — ABNORMAL HIGH (ref 65–99)
Potassium: 4.5 mmol/L (ref 3.5–5.2)
Sodium: 140 mmol/L (ref 134–144)
Total Protein: 6.6 g/dL (ref 6.0–8.5)

## 2018-08-14 LAB — PSA: Prostate Specific Ag, Serum: 1.5 ng/mL (ref 0.0–4.0)

## 2018-08-14 LAB — HEPATITIS C ANTIBODY: Hep C Virus Ab: <0.1 s/co ratio (ref 0.0–0.9)

## 2018-08-14 LAB — HIV ANTIBODY (ROUTINE TESTING W REFLEX): HIV Screen 4th Generation wRfx: NONREACTIVE

## 2018-08-14 LAB — TSH: TSH: 1.77 u[IU]/mL (ref 0.450–4.500)

## 2018-08-22 ENCOUNTER — Other Ambulatory Visit: Payer: Self-pay

## 2018-08-22 ENCOUNTER — Encounter: Payer: Self-pay | Admitting: Family Medicine

## 2018-08-22 ENCOUNTER — Ambulatory Visit (INDEPENDENT_AMBULATORY_CARE_PROVIDER_SITE_OTHER): Payer: BC Managed Care – PPO | Admitting: Family Medicine

## 2018-08-22 VITALS — BP 181/113 | HR 82 | Temp 98.4°F

## 2018-08-22 DIAGNOSIS — I1310 Hypertensive heart and chronic kidney disease without heart failure, with stage 1 through stage 4 chronic kidney disease, or unspecified chronic kidney disease: Secondary | ICD-10-CM | POA: Diagnosis not present

## 2018-08-22 DIAGNOSIS — N183 Chronic kidney disease, stage 3 unspecified: Secondary | ICD-10-CM

## 2018-08-22 DIAGNOSIS — E559 Vitamin D deficiency, unspecified: Secondary | ICD-10-CM | POA: Diagnosis not present

## 2018-08-22 MED ORDER — AMLODIPINE BESYLATE 5 MG PO TABS: 5.0000 mg | ORAL_TABLET | Freq: Every day | ORAL | 3 refills | Status: DC

## 2018-08-22 NOTE — Assessment & Plan Note (Signed)
Better, but still not there. Will add amlodipine and recheck 2 weeks. Call with any concerns.

## 2018-08-22 NOTE — Progress Notes (Signed)
BP (!) 181/113   Pulse 82   Temp 98.4 F (36.9 C)   SpO2 95%    Subjective:    Patient ID: Anthony Ouch., male    DOB: April 28, 1960, 58 y.o.   MRN: [id number]  HPI: Anthony Candelas. is a 58 y.o. male  Chief Complaint  Patient presents with  . Hypertension   HYPERTENSION Hypertension status: better  Satisfied with current treatment? no Duration of hypertension: chronic BP monitoring frequency:  not checking BP medication side effects:  no Medication compliance: good compliance Previous BP meds: irbesartan, metoprolol, hydralazine Aspirin: no Recurrent headaches: no Visual changes: no Palpitations: no Dyspnea: no Chest pain: no Lower extremity edema: no Dizzy/lightheaded: no   Relevant past medical, surgical, family and social history reviewed and updated as indicated. Interim medical history since our last visit reviewed. Allergies and medications reviewed and updated.  Review of Systems  Constitutional: Negative.   Respiratory: Negative.   Cardiovascular: Negative.   Neurological: Negative.   Psychiatric/Behavioral: Negative.     Per HPI unless specifically indicated above     Objective:    BP (!) 181/113   Pulse 82   Temp 98.4 F (36.9 C)   SpO2 95%   Wt Readings from Last 3 Encounters:  08/13/18 231 lb (104.8 kg)  08/05/18 228 lb (103.4 kg)  03/21/18 224 lb 8 oz (101.8 kg)    Physical Exam Vitals signs and nursing note reviewed.  Constitutional:      General: He is not in acute distress.    Appearance: Normal appearance. He is not ill-appearing, toxic-appearing or diaphoretic.  HENT:     Head: Normocephalic and atraumatic.     Right Ear: External ear normal.     Left Ear: External ear normal.     Nose: Nose normal.     Mouth/Throat:     Mouth: Mucous membranes are moist.     Pharynx: Oropharynx is clear.  Eyes:     General: No scleral icterus.       Right eye: No discharge.        Left eye: No discharge.     Extraocular  Movements: Extraocular movements intact.     Conjunctiva/sclera: Conjunctivae normal.     Pupils: Pupils are equal, round, and reactive to light.  Neck:     Musculoskeletal: Normal range of motion and neck supple.  Cardiovascular:     Rate and Rhythm: Normal rate and regular rhythm.     Pulses: Normal pulses.     Heart sounds: Normal heart sounds. No murmur. No friction rub. No gallop.   Pulmonary:     Effort: Pulmonary effort is normal. No respiratory distress.     Breath sounds: Normal breath sounds. No stridor. No wheezing, rhonchi or rales.  Chest:     Chest wall: No tenderness.  Musculoskeletal: Normal range of motion.  Skin:    General: Skin is warm and dry.     Capillary Refill: Capillary refill takes less than 2 seconds.     Coloration: Skin is not jaundiced or pale.     Findings: No bruising, erythema, lesion or rash.  Neurological:     General: No focal deficit present.     Mental Status: He is alert and oriented to person, place, and time. Mental status is at baseline.  Psychiatric:        Mood and Affect: Mood normal.        Behavior: Behavior normal.  Thought Content: Thought content normal.        Judgment: Judgment normal.     Results for orders placed or performed in visit on 08/13/18  Microscopic Examination   URINE  Result Value Ref Range   WBC, UA None seen 0 - 5 /hpf   RBC None seen 0 - 2 /hpf   Epithelial Cells (non renal) 0-10 0 - 10 /hpf   Bacteria, UA None seen None seen/Few  CBC with Differential/Platelet  Result Value Ref Range   WBC 7.5 3.4 - 10.8 x10E3/uL   RBC 5.78 4.14 - 5.80 x10E6/uL   Hemoglobin 14.6 13.0 - 17.7 g/dL   Hematocrit 45.8 37.5 - 51.0 %   MCV 79 79 - 97 fL   MCH 25.3 (L) 26.6 - 33.0 pg   MCHC 31.9 31.5 - 35.7 g/dL   RDW 15.0 11.6 - 15.4 %   Platelets 236 150 - 450 x10E3/uL   Neutrophils 73 Not Estab. %   Lymphs 15 Not Estab. %   Monocytes 6 Not Estab. %   Eos 5 Not Estab. %   Basos 1 Not Estab. %   Neutrophils  Absolute 5.5 1.4 - 7.0 x10E3/uL   Lymphocytes Absolute 1.1 0.7 - 3.1 x10E3/uL   Monocytes Absolute 0.5 0.1 - 0.9 x10E3/uL   EOS (ABSOLUTE) 0.4 0.0 - 0.4 x10E3/uL   Basophils Absolute 0.1 0.0 - 0.2 x10E3/uL   Immature Granulocytes 0 Not Estab. %   Immature Grans (Abs) 0.0 0.0 - 0.1 x10E3/uL  Comprehensive metabolic panel  Result Value Ref Range   Glucose 162 (H) 65 - 99 mg/dL   BUN 26 (H) 6 - 24 mg/dL   Creatinine, Ser 1.49 (H) 0.76 - 1.27 mg/dL   GFR calc non Af Amer 52 (L) >59 mL/min/1.73   GFR calc Af Amer 60 >59 mL/min/1.73   BUN/Creatinine Ratio 17 9 - 20   Sodium 140 134 - 144 mmol/L   Potassium 4.5 3.5 - 5.2 mmol/L   Chloride 104 96 - 106 mmol/L   CO2 18 (L) 20 - 29 mmol/L   Calcium 9.1 8.7 - 10.2 mg/dL   Total Protein 6.6 6.0 - 8.5 g/dL   Albumin 3.9 3.8 - 4.9 g/dL   Globulin, Total 2.7 1.5 - 4.5 g/dL   Albumin/Globulin Ratio 1.4 1.2 - 2.2   Bilirubin Total 0.3 0.0 - 1.2 mg/dL   Alkaline Phosphatase 80 39 - 117 IU/L   AST 23 0 - 40 IU/L   ALT 27 0 - 44 IU/L  Lipid Panel w/o Chol/HDL Ratio  Result Value Ref Range   Cholesterol, Total 147 100 - 199 mg/dL   Triglycerides 127 0 - 149 mg/dL   HDL 37 (L) >39 mg/dL   VLDL Cholesterol Cal 25 5 - 40 mg/dL   LDL Calculated 85 0 - 99 mg/dL  Microalbumin, Urine Waived  Result Value Ref Range   Microalb, Ur Waived 150 (H) 0 - 19 mg/L   Creatinine, Urine Waived 100 10 - 300 mg/dL   Microalb/Creat Ratio >300 (H) <30 mg/g  PSA  Result Value Ref Range   Prostate Specific Ag, Serum 1.5 0.0 - 4.0 ng/mL  TSH  Result Value Ref Range   TSH 1.770 0.450 - 4.500 uIU/mL  UA/M w/rflx Culture, Routine   Specimen: Urine   URINE  Result Value Ref Range   Specific Gravity, UA 1.025 1.005 - 1.030   pH, UA 5.0 5.0 - 7.5   Color, UA Yellow Yellow  Appearance Ur Clear Clear   Leukocytes,UA Negative Negative   Protein,UA 3+ (A) Negative/Trace   Glucose, UA Negative Negative   Ketones, UA Negative Negative   RBC, UA Negative Negative    Bilirubin, UA Negative Negative   Urobilinogen, Ur 0.2 0.2 - 1.0 mg/dL   Nitrite, UA Negative Negative   Microscopic Examination See below:   Hepatitis C Antibody  Result Value Ref Range   Hep C Virus Ab <0.1 0.0 - 0.9 s/co ratio  HIV Antibody (routine testing w rflx)  Result Value Ref Range   HIV Screen 4th Generation wRfx Non Reactive Non Reactive      Assessment & Plan:   Problem List Items Addressed This Visit      Cardiovascular and Mediastinum   Hypertensive heart/kidney disease without HF and with CKD stage III ([place]) - Primary    Better, but still not there. Will add amlodipine and recheck 2 weeks. Call with any concerns.       Relevant Medications   amLODipine (NORVASC) 5 MG tablet   Other Relevant Orders   Basic metabolic panel     Other   Vitamin D deficiency    Rechecking levels today. Will treat as needed.       Relevant Orders   VITAMIN D 25 Hydroxy (Vit-D Deficiency, Fractures)       Follow up plan: Return 2 weeks, for follow up bp.

## 2018-08-22 NOTE — Assessment & Plan Note (Signed)
Rechecking levels today. Will treat as needed.

## 2018-08-23 ENCOUNTER — Other Ambulatory Visit: Payer: Self-pay | Admitting: Family Medicine

## 2018-08-23 LAB — BASIC METABOLIC PANEL
BUN/Creatinine Ratio: 16 (ref 9–20)
BUN: 31 mg/dL — ABNORMAL HIGH (ref 6–24)
CO2: 21 mmol/L (ref 20–29)
Calcium: 9.2 mg/dL (ref 8.7–10.2)
Chloride: 105 mmol/L (ref 96–106)
Creatinine, Ser: 1.9 mg/dL — ABNORMAL HIGH (ref 0.76–1.27)
GFR calc Af Amer: 45 mL/min/{1.73_m2} — ABNORMAL LOW (ref >59)
GFR calc non Af Amer: 39 mL/min/{1.73_m2} — ABNORMAL LOW (ref >59)
Glucose: 94 mg/dL (ref 65–99)
Potassium: 4.3 mmol/L (ref 3.5–5.2)
Sodium: 145 mmol/L — ABNORMAL HIGH (ref 134–144)

## 2018-08-23 LAB — VITAMIN D 25 HYDROXY (VIT D DEFICIENCY, FRACTURES): Vit D, 25-Hydroxy: 42.3 ng/mL (ref 30.0–100.0)

## 2018-08-23 NOTE — Telephone Encounter (Signed)
Forwarding medication refill request to PCP for review. 

## 2018-08-24 ENCOUNTER — Other Ambulatory Visit: Payer: Self-pay | Admitting: Nurse Practitioner

## 2018-08-24 NOTE — Telephone Encounter (Signed)
Your patient 

## 2018-08-24 NOTE — Telephone Encounter (Signed)
Forwarding medication refill request to provider for review. 

## 2018-09-09 ENCOUNTER — Ambulatory Visit (INDEPENDENT_AMBULATORY_CARE_PROVIDER_SITE_OTHER): Payer: BC Managed Care – PPO | Admitting: Family Medicine

## 2018-09-09 ENCOUNTER — Encounter: Payer: Self-pay | Admitting: Family Medicine

## 2018-09-09 ENCOUNTER — Other Ambulatory Visit: Payer: Self-pay

## 2018-09-09 VITALS — BP 159/84 | HR 85 | Temp 98.3°F | Ht 68.5 in | Wt 231.0 lb

## 2018-09-09 DIAGNOSIS — N183 Chronic kidney disease, stage 3 (moderate): Secondary | ICD-10-CM

## 2018-09-09 DIAGNOSIS — I1310 Hypertensive heart and chronic kidney disease without heart failure, with stage 1 through stage 4 chronic kidney disease, or unspecified chronic kidney disease: Secondary | ICD-10-CM

## 2018-09-09 MED ORDER — AMLODIPINE BESYLATE 10 MG PO TABS: 10.0000 mg | ORAL_TABLET | Freq: Every day | ORAL | 3 refills | Status: DC

## 2018-09-09 NOTE — Patient Instructions (Signed)
Keep taking the same medicine except increase the amlodipine to 10mg  (2 pills of what you have)

## 2018-09-09 NOTE — Progress Notes (Signed)
BP (!) 159/84   Pulse 85   Temp 98.3 F (36.8 C) (Oral)   Ht 5' 8.5" (1.74 m)   Wt 231 lb (104.8 kg)   SpO2 95%   BMI 34.61 kg/m    Subjective:    Patient ID: Anthony Ouch., male    DOB: 03/06/1960, 58 y.o.   MRN: [id number]  HPI: Anthony Sellers. is a 58 y.o. male  Chief Complaint  Patient presents with  . Follow-up  . Hypertension   HYPERTENSION Hypertension status: better  Satisfied with current treatment? no Duration of hypertension: chronic BP monitoring frequency:  not checking BP medication side effects:  no Medication compliance: excellent compliance Previous BP meds: irebesartan, metoprolol, hydralazine, amlodipine Aspirin: yes Recurrent headaches: no Visual changes: no Palpitations: no Dyspnea: no Chest pain: no Lower extremity edema: no Dizzy/lightheaded: no  Relevant past medical, surgical, family and social history reviewed and updated as indicated. Interim medical history since our last visit reviewed. Allergies and medications reviewed and updated.  Review of Systems  Constitutional: Negative.   Respiratory: Negative.   Cardiovascular: Negative.   Gastrointestinal: Negative.   Musculoskeletal: Negative.   Psychiatric/Behavioral: Negative.     Per HPI unless specifically indicated above     Objective:    BP (!) 159/84   Pulse 85   Temp 98.3 F (36.8 C) (Oral)   Ht 5' 8.5" (1.74 m)   Wt 231 lb (104.8 kg)   SpO2 95%   BMI 34.61 kg/m   Wt Readings from Last 3 Encounters:  09/09/18 231 lb (104.8 kg)  08/13/18 231 lb (104.8 kg)  08/05/18 228 lb (103.4 kg)    Physical Exam Vitals signs and nursing note reviewed.  Constitutional:      General: He is not in acute distress.    Appearance: Normal appearance. He is not ill-appearing, toxic-appearing or diaphoretic.  HENT:     Head: Normocephalic and atraumatic.     Right Ear: External ear normal.     Left Ear: External ear normal.     Nose: Nose normal.     Mouth/Throat:     Mouth: Mucous membranes are moist.     Pharynx: Oropharynx is clear.  Eyes:     General: No scleral icterus.       Right eye: No discharge.        Left eye: No discharge.     Extraocular Movements: Extraocular movements intact.     Conjunctiva/sclera: Conjunctivae normal.     Pupils: Pupils are equal, round, and reactive to light.  Neck:     Musculoskeletal: Normal range of motion and neck supple.  Cardiovascular:     Rate and Rhythm: Normal rate and regular rhythm.     Pulses: Normal pulses.     Heart sounds: Normal heart sounds. No murmur. No friction rub. No gallop.   Pulmonary:     Effort: Pulmonary effort is normal. No respiratory distress.     Breath sounds: Normal breath sounds. No stridor. No wheezing, rhonchi or rales.  Chest:     Chest wall: No tenderness.  Musculoskeletal: Normal range of motion.  Skin:    General: Skin is warm and dry.     Capillary Refill: Capillary refill takes less than 2 seconds.     Coloration: Skin is not jaundiced or pale.     Findings: No bruising, erythema, lesion or rash.  Neurological:     General: No focal deficit present.     Mental  Status: He is alert and oriented to person, place, and time. Mental status is at baseline.  Psychiatric:        Mood and Affect: Mood normal.        Behavior: Behavior normal.        Thought Content: Thought content normal.        Judgment: Judgment normal.     Results for orders placed or performed in visit on 08/22/18  VITAMIN D 25 Hydroxy (Vit-D Deficiency, Fractures)  Result Value Ref Range   Vit D, 25-Hydroxy 42.3 30.0 - 100.0 ng/mL  Basic metabolic panel  Result Value Ref Range   Glucose 94 65 - 99 mg/dL   BUN 31 (H) 6 - 24 mg/dL   Creatinine, Ser 1.90 (H) 0.76 - 1.27 mg/dL   GFR calc non Af Amer 39 (L) >59 mL/min/1.73   GFR calc Af Amer 45 (L) >59 mL/min/1.73   BUN/Creatinine Ratio 16 9 - 20   Sodium 145 (H) 134 - 144 mmol/L   Potassium 4.3 3.5 - 5.2 mmol/L   Chloride 105 96 - 106 mmol/L    CO2 21 20 - 29 mmol/L   Calcium 9.2 8.7 - 10.2 mg/dL      Assessment & Plan:   Problem List Items Addressed This Visit      Cardiovascular and Mediastinum   Hypertensive heart/kidney disease without HF and with CKD stage III ([place]) - Primary    Doing better, but still not quite there. Will increase amlodipine to 10mg  and recheck 2 weeks.       Relevant Medications   amLODipine (NORVASC) 10 MG tablet       Follow up plan: Return in about 2 weeks (around 09/23/2018) for follow up BP.

## 2018-09-09 NOTE — Assessment & Plan Note (Signed)
Doing better, but still not quite there. Will increase amlodipine to 10mg  and recheck 2 weeks.

## 2018-09-15 ENCOUNTER — Other Ambulatory Visit: Payer: Self-pay | Admitting: Family Medicine

## 2018-09-26 ENCOUNTER — Ambulatory Visit (INDEPENDENT_AMBULATORY_CARE_PROVIDER_SITE_OTHER): Payer: BC Managed Care – PPO | Admitting: Family Medicine

## 2018-09-26 ENCOUNTER — Encounter: Payer: Self-pay | Admitting: Family Medicine

## 2018-09-26 ENCOUNTER — Other Ambulatory Visit: Payer: Self-pay

## 2018-09-26 DIAGNOSIS — I1310 Hypertensive heart and chronic kidney disease without heart failure, with stage 1 through stage 4 chronic kidney disease, or unspecified chronic kidney disease: Secondary | ICD-10-CM

## 2018-09-26 DIAGNOSIS — N183 Chronic kidney disease, stage 3 unspecified: Secondary | ICD-10-CM

## 2018-09-26 MED ORDER — AMLODIPINE BESYLATE 10 MG PO TABS: 10.0000 mg | ORAL_TABLET | Freq: Every day | ORAL | 1 refills | Status: DC

## 2018-09-26 MED ORDER — IRBESARTAN 300 MG PO TABS: 300.0000 mg | ORAL_TABLET | Freq: Every day | ORAL | 1 refills | Status: DC

## 2018-09-26 MED ORDER — HYDROCHLOROTHIAZIDE 25 MG PO TABS: 25.0000 mg | ORAL_TABLET | Freq: Every day | ORAL | 3 refills | Status: DC

## 2018-09-26 NOTE — Progress Notes (Signed)
BP (!) 148/112   Pulse 64    Subjective:    Patient ID: Anthony Ouch., male    DOB: [birth year], 58 y.o.   MRN: [id number]  HPI: Anthony Chhoeun. is a 58 y.o. male  Chief Complaint  Patient presents with  . Hypertension   HYPERTENSION Hypertension status: better  Satisfied with current treatment? yes Duration of hypertension: chronic BP monitoring frequency:  a few times a week BP medication side effects:  no Medication compliance: excellent compliance Previous BP meds: amlodipine, hydralazine, metoprolol, irbesartan Aspirin: yes Recurrent headaches: no Visual changes: no Palpitations: no Dyspnea: no Chest pain: no Lower extremity edema: no Dizzy/lightheaded: no  Relevant past medical, surgical, family and social history reviewed and updated as indicated. Interim medical history since our last visit reviewed. Allergies and medications reviewed and updated.  Review of Systems  Constitutional: Negative.   Respiratory: Negative.   Cardiovascular: Negative.   Neurological: Negative.   Psychiatric/Behavioral: Negative.     Per HPI unless specifically indicated above     Objective:    BP (!) 148/112   Pulse 64   Wt Readings from Last 3 Encounters:  09/09/18 231 lb (104.8 kg)  08/13/18 231 lb (104.8 kg)  08/05/18 228 lb (103.4 kg)    Physical Exam Vitals signs and nursing note reviewed.  Constitutional:      General: He is not in acute distress.    Appearance: Normal appearance. He is not ill-appearing, toxic-appearing or diaphoretic.  HENT:     Head: Normocephalic and atraumatic.     Right Ear: External ear normal.     Left Ear: External ear normal.     Nose: Nose normal.     Mouth/Throat:     Mouth: Mucous membranes are moist.     Pharynx: Oropharynx is clear.  Eyes:     General: No scleral icterus.       Right eye: No discharge.        Left eye: No discharge.     Conjunctiva/sclera: Conjunctivae normal.     Pupils: Pupils are equal, round,  and reactive to light.  Neck:     Musculoskeletal: Normal range of motion.  Pulmonary:     Effort: Pulmonary effort is normal. No respiratory distress.     Comments: Speaking in full sentences Musculoskeletal: Normal range of motion.  Skin:    Coloration: Skin is not jaundiced or pale.     Findings: No bruising, erythema, lesion or rash.  Neurological:     Mental Status: He is alert and oriented to person, place, and time. Mental status is at baseline.  Psychiatric:        Mood and Affect: Mood normal.        Behavior: Behavior normal.        Thought Content: Thought content normal.        Judgment: Judgment normal.     Results for orders placed or performed in visit on 08/22/18  VITAMIN D 25 Hydroxy (Vit-D Deficiency, Fractures)  Result Value Ref Range   Vit D, 25-Hydroxy 42.3 30.0 - 100.0 ng/mL  Basic metabolic panel  Result Value Ref Range   Glucose 94 65 - 99 mg/dL   BUN 31 (H) 6 - 24 mg/dL   Creatinine, Ser 1.90 (H) 0.76 - 1.27 mg/dL   GFR calc non Af Amer 39 (L) >59 mL/min/1.73   GFR calc Af Amer 45 (L) >59 mL/min/1.73   BUN/Creatinine Ratio 16 9 - 20  Sodium 145 (H) 134 - 144 mmol/L   Potassium 4.3 3.5 - 5.2 mmol/L   Chloride 105 96 - 106 mmol/L   CO2 21 20 - 29 mmol/L   Calcium 9.2 8.7 - 10.2 mg/dL      Assessment & Plan:   Problem List Items Addressed This Visit      Cardiovascular and Mediastinum   Hypertensive heart/kidney disease without HF and with CKD stage III ([place])    Better, but not quite there. Will add HCTZ and watch his uric acid closely. Continue to monitor. Recheck 1 month.       Relevant Medications   hydrochlorothiazide (HYDRODIURIL) 25 MG tablet   amLODipine (NORVASC) 10 MG tablet   irbesartan (AVAPRO) 300 MG tablet       Follow up plan: Return in about 4 weeks (around 10/24/2018).    . This visit was completed via [employer] due to the restrictions of the COVID-19 pandemic. All issues as above were discussed and addressed. Physical  exam was done as above through visual confirmation on [employer]. If it was felt that the patient should be evaluated in the office, they were directed there. The patient verbally consented to this visit. . Location of the patient: home . Location of the provider: home . Those involved with this call:  . Provider: Park Liter, DO . CMA: Tiffany Reel, CMA . Front Desk/Registration: Don Perking  . Time spent on call: 15 minutes with patient face to face via video conference. More than 50% of this time was spent in counseling and coordination of care. 23 minutes total spent in review of patient's record and preparation of their chart.

## 2018-09-26 NOTE — Assessment & Plan Note (Signed)
Better, but not quite there. Will add HCTZ and watch his uric acid closely. Continue to monitor. Recheck 1 month.

## 2018-09-30 ENCOUNTER — Other Ambulatory Visit: Payer: Self-pay | Admitting: Family Medicine

## 2018-10-01 ENCOUNTER — Other Ambulatory Visit: Payer: Self-pay

## 2018-10-01 ENCOUNTER — Ambulatory Visit: Payer: Self-pay | Admitting: *Deleted

## 2018-10-01 ENCOUNTER — Ambulatory Visit (INDEPENDENT_AMBULATORY_CARE_PROVIDER_SITE_OTHER): Payer: BC Managed Care – PPO | Admitting: Gastroenterology

## 2018-10-01 VITALS — BP 152/99 | HR 80 | Temp 98.3°F | Ht 68.0 in | Wt 235.6 lb

## 2018-10-01 DIAGNOSIS — K625 Hemorrhage of anus and rectum: Secondary | ICD-10-CM

## 2018-10-01 NOTE — Progress Notes (Signed)
Anthony Bellows MD, MRCP(U.K) [address]  [place]  [place], [place] [address]  Main: [phone]  Fax: [phone]   Gastroenterology Consultation  Referring Provider:     Valerie Roys, DO Primary Care Physician:  Valerie Roys, DO Primary Gastroenterologist:  Dr. Jonathon Sellers  Reason for Consultation:     Rectal bleeding        HPI:   Anthony Sellers. is a 58 y.o. y/o male referred for consultation & management  by Dr. Wynetta Emery, [name], DO.   Back in October 2019 he was scheduled for a colonoscopy but he canceled it.  Did not reschedule.  He has now been referred for rectal bleeding.  Hemoglobin of 14.6 g in July 2020.  He says he has been having rectal bleeding for the past few weeks.  Last few days he has had none.  He says that he has seen blood bright in color mixed with the stool.  Denies any pain with a bowel movement.  No change in the shape of his stool.  Denies any weight loss.  Has a bowel movement daily and which is not hard.  Denies any use of blood thinners.  Denies any family history of colon cancer in first-degree relatives.  Denies any family history of polyps.  His grandfather had colon cancer.  Never had a colonoscopy.   Past Medical History:  Diagnosis Date  . Diabetes mellitus without complication ([place])   . Enlarged prostate   . Gout   . Hyperlipidemia   . Hypertension   . IFG (impaired fasting glucose)   . Vitamin D deficiency     No past surgical history on file.  Prior to Admission medications   Medication Sig Start Date End Date Taking? Authorizing Provider  ACCU-CHEK FASTCLIX LANCETS MISC 1 each by Does not apply route QID. 02/16/18   Glean Hess, MD  allopurinol (ZYLOPRIM) 100 MG tablet Take 1 tablet (100 mg total) by mouth daily. 06/27/18   Johnson, [name], DO  amLODipine (NORVASC) 10 MG tablet Take 1 tablet (10 mg total) by mouth daily. 09/26/18   Johnson, [name], DO  aspirin 81 MG EC tablet Take 1 tablet (81 mg total) by mouth  daily. 05/10/18   Johnson, [name], DO  atorvastatin (LIPITOR) 80 MG tablet Take 1 tablet (80 mg total) by mouth daily at 6 PM. 05/10/18   Johnson, [name], DO  blood glucose meter kit and supplies KIT Dispense based on patient and insurance preference. Use up to four times daily as directed. (FOR ICD-9 250.00, 250.01). 02/15/18   Stark Jock Jude, MD  Blood Glucose Monitoring Suppl (ACCU-CHEK GUIDE) w/Device KIT 1 each by Does not apply route QID. 02/16/18   Glean Hess, MD  hydrALAZINE (APRESOLINE) 100 MG tablet Take 1 tablet (100 mg total) by mouth 2 (two) times daily. 05/10/18   Johnson, [name], DO  hydrochlorothiazide (HYDRODIURIL) 25 MG tablet Take 1 tablet (25 mg total) by mouth daily. 09/26/18   Johnson, [name], DO  indomethacin (INDOCIN SR) 75 MG CR capsule TAKE 1 CAPSULE (75 MG TOTAL) BY MOUTH 2 (TWO) TIMES DAILY WITH A MEAL. 03/15/18   Volney American, PA-C  insulin detemir (LEVEMIR) 100 UNIT/ML injection Inject 0.2 mLs (20 Units total) into the skin daily for 30 days. 05/10/18 06/09/18  Johnson, [name], DO  irbesartan (AVAPRO) 300 MG tablet Take 1 tablet (300 mg total) by mouth daily. 09/26/18   Park Liter  P, DO  metoprolol tartrate (LOPRESSOR) 25 MG tablet Take 1 tablet (25 mg total) by mouth 2 (two) times daily. 05/10/18   Johnson, [name], DO  Misc Natural Products (TART CHERRY ADVANCED PO) Take by mouth.    [provider]  Vitamin D, Ergocalciferol, (DRISDOL) 1.25 MG (50000 UT) CAPS capsule Take 1 capsule (50,000 Units total) by mouth every 7 (seven) days. 06/04/18   Valerie Roys, DO    Family History  Problem Relation Age of Onset  . Hypertension Mother   . Diabetes Mother   . Alzheimer's disease Father   . Diabetes Sister   . Hypertension Brother   . Cancer Maternal Grandfather        Prostate  . Cancer Sister        Breast Cancer     Social History   Tobacco Use  . Smoking status: Former Smoker    Packs/day: 0.25    Years: 8.00    Pack years: 2.00     Types: Cigarettes    Quit date: 01/23/2014    Years since quitting: 4.6  . Smokeless tobacco: Never Used  Substance Use Topics  . Alcohol use: Yes    Alcohol/week: 0.0 standard drinks    Comment: on occasion  . Drug use: No    Allergies as of 10/01/2018  . (No Known Allergies)    Review of Systems:    All systems reviewed and negative except where noted in HPI.   Physical Exam:  There were no vitals taken for this visit. No LMP for male patient. Psych:  Alert and cooperative. Normal mood and affect. General:   Alert,  Well-developed, well-nourished, pleasant and cooperative in NAD Head:  Normocephalic and atraumatic. Eyes:  Sclera clear, no icterus.   Conjunctiva pink. Ears:  Normal auditory acuity. Nose:  No deformity, discharge, or lesions. Mouth:  No deformity or lesions,oropharynx pink & moist. Neck:  Supple; no masses or thyromegaly. Lungs:  Respirations even and unlabored.  Clear throughout to auscultation.   No wheezes, crackles, or rhonchi. No acute distress. Heart:  Regular rate and rhythm; no murmurs, clicks, rubs, or gallops. Abdomen:  Normal bowel sounds.  No bruits.  Soft, non-tender and non-distended without masses, hepatosplenomegaly or hernias noted.  No guarding or rebound tenderness.    Neurologic:  Alert and oriented x3;  grossly normal neurologically. Skin:  Intact without significant lesions or rashes. No jaundice. Lymph Nodes:  No significant cervical adenopathy. Psych:  Alert and cooperative. Normal mood and affect.  Imaging Studies: No results found.  Assessment and Plan:   [name]. is a 58 y.o. y/o male has been referred for rectal bleeding  Plan 1.  Colonoscopy  I have discussed alternative options, risks & benefits,  which include, but are not limited to, bleeding, infection, perforation,respiratory complication & drug reaction.  The patient agrees with this plan & written consent will be obtained.     Follow up in PRN  Dr Anthony Bellows MD,MRCP(U.K)

## 2018-10-01 NOTE — Telephone Encounter (Signed)
Requested medication (s) are due for refill today: yes  Requested medication (s) are on the active medication list: yes  Last refill: 06/12/2018  Future visit scheduled:   Notes to clinic: discontinue on 08/05/2018   Requested Prescriptions  Pending Prescriptions Disp Refills   losartan (COZAAR) 25 MG tablet [Pharmacy Med Name: LOSARTAN POTASSIUM 25 MG TAB] 90 tablet 1    Sig: TAKE 1 TABLET BY MOUTH EVERY DAY     Cardiovascular:  Angiotensin Receptor Blockers Failed - 09/30/2018  9:06 AM      Failed - Cr in normal range and within 180 days    Creatinine, Ser  Date Value Ref Range Status  08/22/2018 1.90 (H) 0.76 - 1.27 mg/dL Final         Failed - Last BP in normal range    BP Readings from Last 1 Encounters:  09/26/18 (!) 148/112         Passed - K in normal range and within 180 days    Potassium  Date Value Ref Range Status  08/22/2018 4.3 3.5 - 5.2 mmol/L Final         Passed - Patient is not pregnant      Passed - Valid encounter within last 6 months    Recent Outpatient Visits          5 days ago Hypertensive heart/kidney disease without HF and with CKD stage III ([place])   [facility] Johnson, [name], DO   3 weeks ago Hypertensive heart/kidney disease without HF and with CKD stage III ([place])   [place], [name], DO   1 month ago Hypertensive heart/kidney disease without HF and with CKD stage III ([place])   [place], [name], DO   1 month ago Routine general medical examination at a health care facility   [facility], [place], DO   1 month ago Hypertensive heart/kidney disease without HF and with CKD stage III [facility])   [place], [name], DO

## 2018-10-01 NOTE — Telephone Encounter (Signed)
She wanted to go thru his medications and make sure they were correct.   Dr Wynetta Emery has made some changes in his BP medications to get his BP down recently.  I went over all the medications and had her call out from his bottles what he was taking and the dosages.   She was missing one and could not figure out which one it was. It ended up being the losartan he was no longer to take.  The new one was the HCTZ 25 mg.   That one she had not picked up from the pharmacy yet but will do today. All the other names and dosages and how to take them was gone over and was correct. She thanked me very much for helping her make sure his medications were straight.  "I prepare his medications and just wanted to be sure they were correct".    I let her know to call any time she had a question or needed clarification.   Reason for Disposition . Caller has medication question only, adult not sick, and triager answers question    Wife wanted clarification of his medications because she was missing one.  Answer Assessment - Initial Assessment Questions 1.   NAME of MEDICATION: "What medicine are you calling about?"     Wife called in wanting clarification of his medications. 2.   QUESTION: "What is your question?"     Wants to know if she has them correct. 3.   PRESCRIBING HCP: "Who prescribed it?" Reason: if prescribed by specialist, call should be referred to that group.     Dr. Wynetta Emery 4. SYMPTOMS: "Do you have any symptoms?"     No 5. SEVERITY: If symptoms are present, ask "Are they mild, moderate or severe?"     N/A 6.  PREGNANCY:  "Is there any chance that you are pregnant?" "When was your last menstrual period?"     N/A  Protocols used: MEDICATION QUESTION CALL-A-AH

## 2018-10-15 ENCOUNTER — Other Ambulatory Visit
Admission: RE | Admit: 2018-10-15 | Discharge: 2018-10-15 | Disposition: A | Discharge status: Home / Self Care | Payer: BC Managed Care – PPO | Source: Ambulatory Visit | Attending: Gastroenterology | Admitting: Gastroenterology

## 2018-10-15 ENCOUNTER — Other Ambulatory Visit: Payer: Self-pay

## 2018-10-15 DIAGNOSIS — Z20828 Contact with and (suspected) exposure to other viral communicable diseases: Secondary | ICD-10-CM | POA: Diagnosis not present

## 2018-10-15 DIAGNOSIS — Z01812 Encounter for preprocedural laboratory examination: Secondary | ICD-10-CM | POA: Diagnosis not present

## 2018-10-15 LAB — SARS CORONAVIRUS 2 (TAT 6-24 HRS): SARS Coronavirus 2: NEGATIVE

## 2018-10-15 MED ORDER — PEG 3350-KCL-NA BICARB-NACL 420 G PO SOLR: ORAL | 0 refills | Status: DC

## 2018-10-18 ENCOUNTER — Ambulatory Visit: Payer: BC Managed Care – PPO | Admitting: Certified Registered Nurse Anesthetist

## 2018-10-18 ENCOUNTER — Ambulatory Visit
Admission: RE | Admit: 2018-10-18 | Discharge: 2018-10-18 | Disposition: A | Discharge status: Home / Self Care | Payer: BC Managed Care – PPO | Attending: Gastroenterology | Admitting: Gastroenterology

## 2018-10-18 ENCOUNTER — Encounter
Admission: RE | Disposition: A | Discharge status: Home / Self Care | Payer: Self-pay | Source: Home / Self Care | Attending: Gastroenterology

## 2018-10-18 ENCOUNTER — Other Ambulatory Visit: Payer: Self-pay

## 2018-10-18 DIAGNOSIS — D122 Benign neoplasm of ascending colon: Secondary | ICD-10-CM | POA: Diagnosis not present

## 2018-10-18 DIAGNOSIS — E559 Vitamin D deficiency, unspecified: Secondary | ICD-10-CM | POA: Insufficient documentation

## 2018-10-18 DIAGNOSIS — Z79899 Other long term (current) drug therapy: Secondary | ICD-10-CM | POA: Diagnosis not present

## 2018-10-18 DIAGNOSIS — K5730 Diverticulosis of large intestine without perforation or abscess without bleeding: Secondary | ICD-10-CM | POA: Insufficient documentation

## 2018-10-18 DIAGNOSIS — K640 First degree hemorrhoids: Secondary | ICD-10-CM | POA: Diagnosis not present

## 2018-10-18 DIAGNOSIS — Z87891 Personal history of nicotine dependence: Secondary | ICD-10-CM | POA: Insufficient documentation

## 2018-10-18 DIAGNOSIS — M109 Gout, unspecified: Secondary | ICD-10-CM | POA: Insufficient documentation

## 2018-10-18 DIAGNOSIS — Z791 Long term (current) use of non-steroidal anti-inflammatories (NSAID): Secondary | ICD-10-CM | POA: Insufficient documentation

## 2018-10-18 DIAGNOSIS — Z794 Long term (current) use of insulin: Secondary | ICD-10-CM | POA: Insufficient documentation

## 2018-10-18 DIAGNOSIS — E1122 Type 2 diabetes mellitus with diabetic chronic kidney disease: Secondary | ICD-10-CM | POA: Insufficient documentation

## 2018-10-18 DIAGNOSIS — G4730 Sleep apnea, unspecified: Secondary | ICD-10-CM | POA: Diagnosis not present

## 2018-10-18 DIAGNOSIS — K649 Unspecified hemorrhoids: Secondary | ICD-10-CM | POA: Diagnosis not present

## 2018-10-18 DIAGNOSIS — I1310 Hypertensive heart and chronic kidney disease without heart failure, with stage 1 through stage 4 chronic kidney disease, or unspecified chronic kidney disease: Secondary | ICD-10-CM | POA: Insufficient documentation

## 2018-10-18 DIAGNOSIS — E785 Hyperlipidemia, unspecified: Secondary | ICD-10-CM | POA: Insufficient documentation

## 2018-10-18 DIAGNOSIS — K625 Hemorrhage of anus and rectum: Secondary | ICD-10-CM

## 2018-10-18 DIAGNOSIS — N183 Chronic kidney disease, stage 3 (moderate): Secondary | ICD-10-CM | POA: Insufficient documentation

## 2018-10-18 DIAGNOSIS — K635 Polyp of colon: Secondary | ICD-10-CM | POA: Diagnosis not present

## 2018-10-18 DIAGNOSIS — D126 Benign neoplasm of colon, unspecified: Secondary | ICD-10-CM | POA: Diagnosis not present

## 2018-10-18 DIAGNOSIS — Z7982 Long term (current) use of aspirin: Secondary | ICD-10-CM | POA: Insufficient documentation

## 2018-10-18 HISTORY — PX: COLONOSCOPY WITH PROPOFOL: SHX5780

## 2018-10-18 LAB — GLUCOSE, CAPILLARY: Glucose-Capillary: 130 mg/dL — ABNORMAL HIGH (ref 70–99)

## 2018-10-18 SURGERY — COLONOSCOPY WITH PROPOFOL
Anesthesia: General

## 2018-10-18 MED ORDER — METOPROLOL TARTRATE 5 MG/5ML IV SOLN
INTRAVENOUS | Status: DC | PRN
  Administered 2018-10-18 (×2): 1 mg via INTRAVENOUS

## 2018-10-18 MED ORDER — METOPROLOL TARTRATE 5 MG/5ML IV SOLN
INTRAVENOUS | Status: AC
  Filled 2018-10-18: qty 5

## 2018-10-18 MED ORDER — MIDAZOLAM HCL 2 MG/2ML IJ SOLN
INTRAMUSCULAR | Status: AC
  Filled 2018-10-18: qty 2

## 2018-10-18 MED ORDER — PROPOFOL 500 MG/50ML IV EMUL
INTRAVENOUS | Status: AC
  Filled 2018-10-18: qty 50

## 2018-10-18 MED ORDER — LIDOCAINE HCL (CARDIAC) PF 100 MG/5ML IV SOSY
PREFILLED_SYRINGE | INTRAVENOUS | Status: DC | PRN
  Administered 2018-10-18: 50 mg via INTRAVENOUS

## 2018-10-18 MED ORDER — GLYCOPYRROLATE 0.2 MG/ML IJ SOLN
INTRAMUSCULAR | Status: DC | PRN
  Administered 2018-10-18: 0.2 mg via INTRAVENOUS

## 2018-10-18 MED ORDER — SODIUM CHLORIDE 0.9 % IV SOLN
INTRAVENOUS | Status: DC
  Administered 2018-10-18: 1000 mL via INTRAVENOUS

## 2018-10-18 MED ORDER — MIDAZOLAM HCL 2 MG/2ML IJ SOLN
INTRAMUSCULAR | Status: DC | PRN
  Administered 2018-10-18: 2 mg via INTRAVENOUS

## 2018-10-18 MED ORDER — PROPOFOL 500 MG/50ML IV EMUL
INTRAVENOUS | Status: DC | PRN
  Administered 2018-10-18: 175 ug/kg/min via INTRAVENOUS

## 2018-10-18 MED ORDER — PROPOFOL 10 MG/ML IV BOLUS
INTRAVENOUS | Status: DC | PRN
  Administered 2018-10-18: 50 mg via INTRAVENOUS

## 2018-10-18 MED ORDER — LIDOCAINE HCL (PF) 2 % IJ SOLN
INTRAMUSCULAR | Status: AC
  Filled 2018-10-18: qty 10

## 2018-10-18 NOTE — Transfer of Care (Signed)
Immediate Anesthesia Transfer of Care Note  Patient: Anthony LAUR Sr.  Procedure(s) Performed: COLONOSCOPY WITH PROPOFOL (N/A )  Patient Location: PACU  Anesthesia Type:General  Level of Consciousness: drowsy  Airway & Oxygen Therapy: Patient Spontanous Breathing and Patient connected to face mask oxygen  Post-op Assessment: Report given to RN and Post -op Vital signs reviewed and stable  Post vital signs: Reviewed and stable  Last Vitals:  Vitals Value Taken Time  BP 128/82   Temp 96.9   Pulse 87   Resp 16   SpO2 99     Last Pain:  Vitals:   10/18/18 1000  TempSrc: Tympanic  PainSc:          Complications: No apparent anesthesia complications

## 2018-10-18 NOTE — Op Note (Signed)
[facility] Patient Name: Anthony Sellers Procedure Date: 10/18/2018 9:36 AM MRN: [id number] Account #: [id number] Date of Birth: [birth year] Admit Type: Outpatient Age: 58 Room: [facility] ENDO ROOM 4 Gender: Male Note Status: Finalized Procedure:            Colonoscopy Indications:          Rectal bleeding Providers:            [name] MD, MD Referring MD:         Valerie Roys (Referring MD) Medicines:            Monitored Anesthesia Care Complications:        No immediate complications. Procedure:            Pre-Anesthesia Assessment:                       - Prior to the procedure, a History and Physical was                        performed, and patient medications, allergies and                        sensitivities were reviewed. The patient's tolerance of                        previous anesthesia was reviewed.                       - The risks and benefits of the procedure and the                        sedation options and risks were discussed with the                        patient. All questions were answered and informed                        consent was obtained.                       - ASA Grade Assessment: II - A patient with mild                        systemic disease.                       After obtaining informed consent, the colonoscope was                        passed under direct vision. Throughout the procedure,                        the patient's blood pressure, pulse, and oxygen                        saturations were monitored continuously. The                        Colonoscope was introduced through the anus and                        advanced to  the the cecum, identified by the                        appendiceal orifice. The colonoscopy was performed with                        ease. The patient tolerated the procedure well. The                        quality of the bowel preparation was excellent. Findings:      The  perianal and digital rectal examinations were normal.      Multiple small-mouthed diverticula were found in the sigmoid colon.       There was no evidence of diverticular bleeding.      Non-bleeding internal hemorrhoids were found during retroflexion. The       hemorrhoids were medium-sized and Grade I (internal hemorrhoids that do       not prolapse).      Two sessile polyps were found in the ascending colon. The polyps were 3       to 5 mm in size. These polyps were removed with a cold snare. Resection       and retrieval were complete.      The exam was otherwise without abnormality on direct and retroflexion       views. Impression:           - Moderate diverticulosis in the sigmoid colon. There                        was no evidence of diverticular bleeding.                       - Non-bleeding internal hemorrhoids.                       - Two 3 to 5 mm polyps in the ascending colon, removed                        with a cold snare. Resected and retrieved.                       - The examination was otherwise normal on direct and                        retroflexion views. Recommendation:       - Discharge patient to home (with escort).                       - Resume previous diet.                       - Continue present medications.                       - Await pathology results.                       - Repeat colonoscopy for surveillance based on                        pathology results.                       -  Return to GI office PRN. Procedure Code(s):    --- Professional ---                       [phone], Colonoscopy, flexible; with removal of tumor(s),                        polyp(s), or other lesion(s) by snare technique Diagnosis Code(s):    --- Professional ---                       K64.0, First degree hemorrhoids                       K63.5, Polyp of colon                       K62.5, Hemorrhage of anus and rectum                       K57.30, Diverticulosis of large  intestine without                        perforation or abscess without bleeding CPT copyright 2019 American Medical Association. All rights reserved. The codes documented in this report are preliminary and upon coder review may  be revised to meet current compliance requirements. [name], MD [name] MD, MD 10/18/2018 10:02:37 AM This report has been signed electronically. Number of Addenda: 0 Note Initiated On: 10/18/2018 9:36 AM Scope Withdrawal Time: 0 hours 12 minutes 11 seconds  Total Procedure Duration: 0 hours 19 minutes 18 seconds  Estimated Blood Loss: Estimated blood loss: none.      [facility]

## 2018-10-18 NOTE — Anesthesia Post-op Follow-up Note (Signed)
Anesthesia QCDR form completed.        

## 2018-10-18 NOTE — Anesthesia Preprocedure Evaluation (Signed)
Anesthesia Evaluation  Patient identified by MRN, date of birth, ID band Patient awake    Reviewed: Allergy & Precautions, NPO status , Patient's Chart, lab work & pertinent test results  History of Anesthesia Complications Negative for: history of anesthetic complications  Airway Mallampati: II       Dental   Pulmonary sleep apnea and Continuous Positive Airway Pressure Ventilation , neg COPD, Not current smoker, former smoker,           Cardiovascular hypertension, Pt. on medications (-) Past MI and (-) CHF (-) dysrhythmias (-) Valvular Problems/Murmurs     Neuro/Psych neg Seizures    GI/Hepatic Neg liver ROS, neg GERD  ,  Endo/Other  diabetes, Type 2, Oral Hypoglycemic Agents  Renal/GU Renal InsufficiencyRenal disease     Musculoskeletal   Abdominal   Peds  Hematology   Anesthesia Other Findings   Reproductive/Obstetrics                             Anesthesia Physical Anesthesia Plan  ASA: III  Anesthesia Plan: General   Post-op Pain Management:    Induction: Intravenous  PONV Risk Score and Plan: 2 and Propofol infusion and TIVA  Airway Management Planned: Nasal Cannula  Additional Equipment:   Intra-op Plan:   Post-operative Plan:   Informed Consent: I have reviewed the patients History and Physical, chart, labs and discussed the procedure including the risks, benefits and alternatives for the proposed anesthesia with the patient or authorized representative who has indicated his/her understanding and acceptance.       Plan Discussed with:   Anesthesia Plan Comments:         Anesthesia Quick Evaluation

## 2018-10-18 NOTE — H&P (Signed)
Anthony Bellows, MD [address] [place], [place], [place], [address] [address], [place], [place], [place], [address] Phone: [phone]  Fax: [phone]  Primary Care Physician:  Valerie Roys, DO   Pre-Procedure History & Physical: HPI:  Anthony Sellers. is a 58 y.o. male is here for an colonoscopy.   Past Medical History:  Diagnosis Date  . Diabetes mellitus without complication ([place])   . Enlarged prostate   . Gout   . Hyperlipidemia   . Hypertension   . IFG (impaired fasting glucose)   . Vitamin D deficiency     No past surgical history on file.  Prior to Admission medications   Medication Sig Start Date End Date Taking? Authorizing Provider  ACCU-CHEK FASTCLIX LANCETS MISC 1 each by Does not apply route QID. 02/16/18  Yes Glean Hess, MD  amLODipine (NORVASC) 10 MG tablet Take 1 tablet (10 mg total) by mouth daily. 09/26/18  Yes Johnson, [name], DO  aspirin 81 MG EC tablet Take 1 tablet (81 mg total) by mouth daily. 05/10/18  Yes Johnson, [name], DO  atorvastatin (LIPITOR) 80 MG tablet Take 1 tablet (80 mg total) by mouth daily at 6 PM. 05/10/18  Yes Johnson, [name], DO  blood glucose meter kit and supplies KIT Dispense based on patient and insurance preference. Use up to four times daily as directed. (FOR ICD-9 250.00, 250.01). 02/15/18  Yes Ojie, Jude, MD  Blood Glucose Monitoring Suppl (ACCU-CHEK GUIDE) w/Device KIT 1 each by Does not apply route QID. 02/16/18  Yes Glean Hess, MD  hydrALAZINE (APRESOLINE) 100 MG tablet Take 1 tablet (100 mg total) by mouth 2 (two) times daily. 05/10/18  Yes Johnson, [name], DO  hydrochlorothiazide (HYDRODIURIL) 25 MG tablet Take 1 tablet (25 mg total) by mouth daily. 09/26/18  Yes Johnson, [name], DO  indomethacin (INDOCIN SR) 75 MG CR capsule TAKE 1 CAPSULE (75 MG TOTAL) BY MOUTH 2 (TWO) TIMES DAILY WITH A MEAL. 03/15/18  Yes Volney American, PA-C  irbesartan (AVAPRO) 300 MG tablet Take 1 tablet (300 mg  total) by mouth daily. 09/26/18  Yes Johnson, [name], DO  metoprolol tartrate (LOPRESSOR) 25 MG tablet Take 1 tablet (25 mg total) by mouth 2 (two) times daily. 05/10/18  Yes Johnson, [name], DO  Misc Natural Products (TART CHERRY ADVANCED PO) Take by mouth.   Yes [provider]  polyethylene glycol-electrolytes (NULYTELY/GOLYTELY) 420 g solution Drink one 8 oz glass every 20 mins until entire container is finished starting at 5:00pm on 10/17/18. 10/15/18  Yes Anthony Bellows, MD  Vitamin D, Ergocalciferol, (DRISDOL) 1.25 MG (50000 UT) CAPS capsule Take 1 capsule (50,000 Units total) by mouth every 7 (seven) days. 06/04/18  Yes Johnson, [name], DO  allopurinol (ZYLOPRIM) 100 MG tablet Take 1 tablet (100 mg total) by mouth daily. 06/27/18   Johnson, [name], DO  insulin detemir (LEVEMIR) 100 UNIT/ML injection Inject 0.2 mLs (20 Units total) into the skin daily for 30 days. 05/10/18 06/09/18  Park Liter P, DO    Allergies as of 10/02/2018  . (No Known Allergies)    Family History  Problem Relation Age of Onset  . Hypertension Mother   . Diabetes Mother   . Alzheimer's disease Father   . Diabetes Sister   . Hypertension Brother   . Cancer Maternal Grandfather        Prostate  . Cancer Sister  Breast Cancer    Social History   Socioeconomic History  . Marital status: Married    Spouse name: Not on file  . Number of children: Not on file  . Years of education: Not on file  . Highest education level: Not on file  Occupational History  . Not on file  Social Needs  . Financial resource strain: Not on file  . Food insecurity    Worry: Not on file    Inability: Not on file  . Transportation needs    Medical: Not on file    Non-medical: Not on file  Tobacco Use  . Smoking status: Former Smoker    Packs/day: 0.25    Years: 8.00    Pack years: 2.00    Types: Cigarettes    Quit date: 01/23/2014    Years since quitting: 4.7  . Smokeless tobacco: Never Used  Substance and  Sexual Activity  . Alcohol use: Yes    Alcohol/week: 0.0 standard drinks    Comment: on occasion  . Drug use: No  . Sexual activity: Never  Lifestyle  . Physical activity    Days per week: Not on file    Minutes per session: Not on file  . Stress: Not on file  Relationships  . Social Herbalist on phone: Not on file    Gets together: Not on file    Attends religious service: Not on file    Active member of club or organization: Not on file    Attends meetings of clubs or organizations: Not on file    Relationship status: Not on file  . Intimate partner violence    Fear of current or ex partner: Not on file    Emotionally abused: Not on file    Physically abused: Not on file    Forced sexual activity: Not on file  Other Topics Concern  . Not on file  Social History Narrative  . Not on file    Review of Systems: See HPI, otherwise negative ROS  Physical Exam: BP (!) 158/126   Pulse 90   Temp (!) 96.9 F (36.1 C) (Tympanic)   Resp 20   Ht _0  (1.676 m)   Wt 106.1 kg   SpO2 99%   BMI 37.77 kg/m  General:   Alert,  pleasant and cooperative in NAD Head:  Normocephalic and atraumatic. Neck:  Supple; no masses or thyromegaly. Lungs:  Clear throughout to auscultation, normal respiratory effort.    Heart:  +S1, +S2, Regular rate and rhythm, No edema. Abdomen:  Soft, nontender and nondistended. Normal bowel sounds, without guarding, and without rebound.   Neurologic:  Alert and  oriented x4;  grossly normal neurologically.  Impression/Plan: Anthony Feil Sr. is here for an colonoscopy to be performed for rectal bleeding  Risks, benefits, limitations, and alternatives regarding  colonoscopy have been reviewed with the patient.  Questions have been answered.  All parties agreeable.   Anthony Bellows, MD  10/18/2018, 9:27 AM

## 2018-10-18 NOTE — Anesthesia Postprocedure Evaluation (Signed)
Anesthesia Post Note  Patient: Anthony LOMBARDI Sr.  Procedure(s) Performed: COLONOSCOPY WITH PROPOFOL (N/A )  Patient location during evaluation: Endoscopy Anesthesia Type: General Level of consciousness: awake and alert Pain management: pain level controlled Vital Signs Assessment: post-procedure vital signs reviewed and stable Respiratory status: spontaneous breathing and respiratory function stable Cardiovascular status: stable Anesthetic complications: no     Last Vitals:  Vitals:   10/18/18 0859 10/18/18 1000  BP: (!) 158/126 128/82  Pulse: 90 87  Resp: 20 15  Temp: (!) 36.1 C (!) 36.1 C  SpO2: 99% 99%    Last Pain:  Vitals:   10/18/18 1000  TempSrc: Tympanic  PainSc:                  KEPHART,WILLIAM K

## 2018-10-19 NOTE — Progress Notes (Signed)
Line Busy.

## 2018-10-21 ENCOUNTER — Encounter: Payer: Self-pay | Admitting: Gastroenterology

## 2018-10-21 LAB — SURGICAL PATHOLOGY

## 2018-10-22 ENCOUNTER — Encounter: Payer: Self-pay | Admitting: Gastroenterology

## 2018-11-17 ENCOUNTER — Other Ambulatory Visit: Payer: Self-pay | Admitting: Family Medicine

## 2018-12-16 ENCOUNTER — Telehealth: Payer: Self-pay | Admitting: Family Medicine

## 2018-12-16 MED ORDER — LANTUS SOLOSTAR 100 UNIT/ML ~~LOC~~ SOPN: 20.0000 [IU] | PEN_INJECTOR | Freq: Every day | SUBCUTANEOUS | 1 refills | Status: DC

## 2018-12-16 NOTE — Telephone Encounter (Signed)
Needs a follow up to see if he even needs this.

## 2018-12-16 NOTE — Telephone Encounter (Signed)
Pt wife is calling and the insurance will not pay for levemir and they have alternative one. Pt wife does not remember the name. Pt did not have any insulin to take this morning. [facility]

## 2018-12-16 NOTE — Telephone Encounter (Signed)
Rx already sent to his pharmacy 

## 2018-12-16 NOTE — Telephone Encounter (Signed)
Called pharmacy. Lantus is the covered medication.

## 2018-12-16 NOTE — Telephone Encounter (Signed)
Tried calling patient's wife. Son answered and stated that the patient's wife and the patient were both out right now. Son stated that the patient's wife will call back when she can.

## 2018-12-16 NOTE — Telephone Encounter (Signed)
Pt wife calling to check status. Please advise  °

## 2018-12-16 NOTE — Telephone Encounter (Signed)
Patients wife notified

## 2018-12-16 NOTE — Telephone Encounter (Signed)
Pt wife is calling and would like to know if her husband will be ok to wait until an appt. He is out of insulin. Can pt have virtual appt?

## 2018-12-16 NOTE — Telephone Encounter (Signed)
Called pt's wife nancy. Scheduled appt for 12/10. She would like enough medicine sent to last him until then.

## 2019-01-02 ENCOUNTER — Encounter: Payer: Self-pay | Admitting: Family Medicine

## 2019-01-02 ENCOUNTER — Ambulatory Visit (INDEPENDENT_AMBULATORY_CARE_PROVIDER_SITE_OTHER): Payer: BC Managed Care – PPO | Admitting: Family Medicine

## 2019-01-02 ENCOUNTER — Other Ambulatory Visit: Payer: Self-pay

## 2019-01-02 VITALS — BP 128/81 | HR 86

## 2019-01-02 DIAGNOSIS — E1121 Type 2 diabetes mellitus with diabetic nephropathy: Secondary | ICD-10-CM | POA: Diagnosis not present

## 2019-01-02 MED ORDER — METFORMIN HCL ER 500 MG PO TB24: 1000.0000 mg | ORAL_TABLET | Freq: Two times a day (BID) | ORAL | 1 refills | Status: DC

## 2019-01-02 NOTE — Progress Notes (Signed)
BP 128/81   Pulse 86    Subjective:    Patient ID: Anthony Ouch., male    DOB: Mar 17, 1960, 58 y.o.   MRN: [id number]  HPI: Anthony Delao. is a 58 y.o. male  Chief Complaint  Patient presents with  . Diabetes    blood sugar now is 92   DIABETES Hypoglycemic episodes:no Polydipsia/polyuria: no Visual disturbance: no Chest pain: no Paresthesias: no Glucose Monitoring: yes  Accucheck frequency: Daily  Fasting glucose: 150s Taking Insulin?: yes  Long acting insulin: Blood Pressure Monitoring: not checking Retinal Examination: Up to Date Foot Exam: Up to Date Diabetic Education: Completed Pneumovax: Up to Date Influenza: Not up to Date Aspirin: no  Relevant past medical, surgical, family and social history reviewed and updated as indicated. Interim medical history since our last visit reviewed. Allergies and medications reviewed and updated.  Review of Systems  Constitutional: Negative.   Respiratory: Negative.   Cardiovascular: Negative.   Musculoskeletal: Negative.   Neurological: Negative.   Psychiatric/Behavioral: Negative.     Per HPI unless specifically indicated above     Objective:    BP 128/81   Pulse 86   Wt Readings from Last 3 Encounters:  10/18/18 234 lb (106.1 kg)  10/01/18 235 lb 9.6 oz (106.9 kg)  09/09/18 231 lb (104.8 kg)    Physical Exam Vitals and nursing note reviewed.  Constitutional:      General: He is not in acute distress.    Appearance: Normal appearance. He is not ill-appearing, toxic-appearing or diaphoretic.  HENT:     Head: Normocephalic and atraumatic.     Right Ear: External ear normal.     Left Ear: External ear normal.     Nose: Nose normal.     Mouth/Throat:     Mouth: Mucous membranes are moist.     Pharynx: Oropharynx is clear.  Eyes:     General: No scleral icterus.       Right eye: No discharge.        Left eye: No discharge.     Conjunctiva/sclera: Conjunctivae normal.     Pupils: Pupils are  equal, round, and reactive to light.  Pulmonary:     Effort: Pulmonary effort is normal. No respiratory distress.     Comments: Speaking in full sentences Musculoskeletal:        General: Normal range of motion.     Cervical back: Normal range of motion.  Skin:    Coloration: Skin is not jaundiced or pale.     Findings: No bruising, erythema, lesion or rash.  Neurological:     Mental Status: He is alert and oriented to person, place, and time. Mental status is at baseline.  Psychiatric:        Mood and Affect: Mood normal.        Behavior: Behavior normal.        Thought Content: Thought content normal.        Judgment: Judgment normal.     Results for orders placed or performed during the hospital encounter of 10/18/18  Glucose, capillary  Result Value Ref Range   Glucose-Capillary 130 (H) 70 - 99 mg/dL   Comment 1 IN EPIC   Surgical pathology  Result Value Ref Range   SURGICAL PATHOLOGY      SURGICAL PATHOLOGY CASE: [id number] PATIENT: Anthony Sellers Surgical Pathology Report     Specimen Submitted: A. Colon polyp x2, ascending; cold snare  Clinical History: Rectal bleeding k62.5.  Colon polyps, diverticulosis, internal hemorrhoids    DIAGNOSIS: A. COLON POLYPS X2, ASCENDING; COLD SNARE: - FRAGMENTS (X2) OF SESSILE SERRATED POLYP. - SINGLE FRAGMENT OF BENIGN COLONIC MUCOSA WITH SUPERFICIAL REACTIVE CHANGES. - NEGATIVE FOR DYSPLASIA AND MALIGNANCY.  Comment: Multiple additional deeper recut levels were examined.  GROSS DESCRIPTION: A. Labeled: Ascending colon polyp, cold snare x2 Received: In formalin Tissue fragment(s): 2 Size: 0.2 and 0.6 cm Description: Tan soft tissue fragments Entirely submitted in 1 cassette.    Final Diagnosis performed by Allena Napoleon, MD.   Electronically signed 10/21/2018 4:09:31PM The electronic signature indicates that the named Attending Pathologist has evaluated the specimen Technical component performed  at [facility],  [address] [place], [place] [address] Lab: [phone] Dir: Rush Farmer, MD, MMM  Professional component performed at [facility], [facility], [place], [place], [place] [address] Lab: [phone] Dir: Dellia Nims. Reuel Derby, MD       Assessment & Plan:   Problem List Items Addressed This Visit      Endocrine   Type 2 diabetes with nephropathy ([place]) - Primary    Sugars have been OK- stopped the novolog and now just on the lantus. Will try to transition over to metformin, watching kidneys very closely. Recheck BMP 1 week. Follow up 1 month.       Relevant Medications   metFORMIN (GLUCOPHAGE-XR) 500 MG 24 hr tablet   Other Relevant Orders   Basic Metabolic Panel (BMET)   Bayer DCA Hb A1c Waived       Follow up plan: Return in about 4 weeks (around 01/30/2019).    . This visit was completed via FaceTime due to the restrictions of the COVID-19 pandemic. All issues as above were discussed and addressed. Physical exam was done as above through visual confirmation on FaceTime. If it was felt that the patient should be evaluated in the office, they were directed there. The patient verbally consented to this visit. . Location of the patient: home . Location of the provider: work . Those involved with this call:  . Provider: Park Liter, DO . CMA: Tiffany Reel, CMA . Front Desk/Registration: Don Perking  . Time spent on call: 15 minutes with patient face to face via video conference. More than 50% of this time was spent in counseling and coordination of care. 23 minutes total spent in review of patient's record and preparation of their chart.

## 2019-01-02 NOTE — Assessment & Plan Note (Signed)
Sugars have been OK- stopped the novolog and now just on the lantus. Will try to transition over to metformin, watching kidneys very closely. Recheck BMP 1 week. Follow up 1 month.

## 2019-01-08 ENCOUNTER — Encounter: Payer: Self-pay | Admitting: Family Medicine

## 2019-01-08 NOTE — Progress Notes (Signed)
Unable to LVM, Sent letter.

## 2019-01-09 ENCOUNTER — Other Ambulatory Visit: Payer: Self-pay | Admitting: Family Medicine

## 2019-01-10 ENCOUNTER — Other Ambulatory Visit: Payer: Self-pay | Admitting: Family Medicine

## 2019-01-10 NOTE — Telephone Encounter (Signed)
Forwarding medication refill request to PCP for review. 

## 2019-01-13 ENCOUNTER — Other Ambulatory Visit: Payer: BC Managed Care – PPO

## 2019-01-13 ENCOUNTER — Other Ambulatory Visit: Payer: Self-pay

## 2019-01-13 DIAGNOSIS — E1121 Type 2 diabetes mellitus with diabetic nephropathy: Secondary | ICD-10-CM

## 2019-01-13 LAB — BAYER DCA HB A1C WAIVED: HB A1C (BAYER DCA - WAIVED): 7 % — ABNORMAL HIGH (ref <7.0)

## 2019-01-14 LAB — BASIC METABOLIC PANEL
BUN/Creatinine Ratio: 16 (ref 9–20)
BUN: 31 mg/dL — ABNORMAL HIGH (ref 6–24)
CO2: 23 mmol/L (ref 20–29)
Calcium: 9.5 mg/dL (ref 8.7–10.2)
Chloride: 106 mmol/L (ref 96–106)
Creatinine, Ser: 1.88 mg/dL — ABNORMAL HIGH (ref 0.76–1.27)
GFR calc Af Amer: 45 mL/min/{1.73_m2} — ABNORMAL LOW (ref >59)
GFR calc non Af Amer: 39 mL/min/{1.73_m2} — ABNORMAL LOW (ref >59)
Glucose: 91 mg/dL (ref 65–99)
Potassium: 4.1 mmol/L (ref 3.5–5.2)
Sodium: 143 mmol/L (ref 134–144)

## 2019-01-21 ENCOUNTER — Other Ambulatory Visit: Payer: Self-pay | Admitting: Family Medicine

## 2019-02-01 ENCOUNTER — Other Ambulatory Visit: Payer: Self-pay | Admitting: Family Medicine

## 2019-03-02 ENCOUNTER — Other Ambulatory Visit: Payer: Self-pay | Admitting: Family Medicine

## 2019-03-02 NOTE — Telephone Encounter (Signed)
Requested Prescriptions  Pending Prescriptions Disp Refills  . metFORMIN (GLUCOPHAGE-XR) 500 MG 24 hr tablet [Pharmacy Med Name: METFORMIN HCL ER 500 MG TABLET] 120 tablet 1    Sig: TAKE 2 TABLETS (1,000 MG TOTAL) BY MOUTH 2 (TWO) TIMES DAILY WITH A MEAL.     Endocrinology:  Diabetes - Biguanides Failed - 03/02/2019  9:31 AM      Failed - Cr in normal range and within 360 days    Creatinine, Ser  Date Value Ref Range Status  01/13/2019 1.88 (H) 0.76 - 1.27 mg/dL Final         Failed - eGFR in normal range and within 360 days    GFR calc Af Amer  Date Value Ref Range Status  01/13/2019 45 (L) >59 mL/min/1.73 Final   GFR calc non Af Amer  Date Value Ref Range Status  01/13/2019 39 (L) >59 mL/min/1.73 Final         Passed - HBA1C is between 0 and 7.9 and within 180 days    HB A1C (BAYER DCA - WAIVED)  Date Value Ref Range Status  01/13/2019 7.0 (H) <7.0 % Final    Comment:                                          Diabetic Adult            <7.0                                       Healthy Adult        4.3 - 5.7                                                           (DCCT/NGSP) American Diabetes Association's Summary of Glycemic Recommendations for Adults with Diabetes: Hemoglobin A1c <7.0%. More stringent glycemic goals (A1c <6.0%) may further reduce complications at the cost of increased risk of hypoglycemia.          Passed - Valid encounter within last 6 months    Recent Outpatient Visits          1 month ago Type 2 diabetes with nephropathy ([place])   [place], [name], DO   5 months ago Hypertensive heart/kidney disease without HF and with CKD stage III ([place])   [facility], [name], DO   5 months ago Hypertensive heart/kidney disease without HF and with CKD stage III ([place])   [place], [name], DO   6 months ago Hypertensive heart/kidney disease without HF and with CKD stage III ([place])   [place], [name], DO   6 months ago Routine general medical examination at a health care facility   [facility], [place], DO

## 2019-03-24 ENCOUNTER — Other Ambulatory Visit: Payer: Self-pay | Admitting: Family Medicine

## 2019-04-03 ENCOUNTER — Other Ambulatory Visit: Payer: Self-pay | Admitting: Family Medicine

## 2019-04-05 ENCOUNTER — Other Ambulatory Visit: Payer: Self-pay | Admitting: Family Medicine

## 2019-04-05 NOTE — Telephone Encounter (Signed)
Requested Prescriptions  Pending Prescriptions Disp Refills  . metFORMIN (GLUCOPHAGE-XR) 500 MG 24 hr tablet [Pharmacy Med Name: METFORMIN HCL ER 500 MG TABLET] 120 tablet 0    Sig: TAKE 2 TABLETS (1,000 MG TOTAL) BY MOUTH 2 (TWO) TIMES DAILY WITH A MEAL.     Endocrinology:  Diabetes - Biguanides Failed - 04/05/2019 10:31 AM      Failed - Cr in normal range and within 360 days    Creatinine, Ser  Date Value Ref Range Status  01/13/2019 1.88 (H) 0.76 - 1.27 mg/dL Final         Failed - eGFR in normal range and within 360 days    GFR calc Af Amer  Date Value Ref Range Status  01/13/2019 45 (L) >59 mL/min/1.73 Final   GFR calc non Af Amer  Date Value Ref Range Status  01/13/2019 39 (L) >59 mL/min/1.73 Final         Passed - HBA1C is between 0 and 7.9 and within 180 days    HB A1C (BAYER DCA - WAIVED)  Date Value Ref Range Status  01/13/2019 7.0 (H) <7.0 % Final    Comment:                                          Diabetic Adult            <7.0                                       Healthy Adult        4.3 - 5.7                                                           (DCCT/NGSP) American Diabetes Association's Summary of Glycemic Recommendations for Adults with Diabetes: Hemoglobin A1c <7.0%. More stringent glycemic goals (A1c <6.0%) may further reduce complications at the cost of increased risk of hypoglycemia.          Passed - Valid encounter within last 6 months    Recent Outpatient Visits          3 months ago Type 2 diabetes with nephropathy ([place])   [employer], [name], DO   6 months ago Hypertensive heart/kidney disease without HF and with CKD stage III ([place])   [place], [name], DO   6 months ago Hypertensive heart/kidney disease without HF and with CKD stage III ([place])   [place], [name], DO   7 months ago Hypertensive heart/kidney disease without HF and with CKD stage III ([place])   [place], [name], DO   7 months ago Routine general medical examination at a health care facility   [facility], [place], DO

## 2019-04-13 ENCOUNTER — Other Ambulatory Visit: Payer: Self-pay | Admitting: Family Medicine

## 2019-04-13 NOTE — Telephone Encounter (Signed)
Requested Prescriptions  Pending Prescriptions Disp Refills  . ASPIRIN LOW DOSE 81 MG EC tablet [Pharmacy Med Name: ASPIRIN EC 81 MG TABLET] 120 tablet 3    Sig: TAKE 1 TABLET BY MOUTH EVERY DAY     Analgesics:  NSAIDS - aspirin Passed - 04/13/2019 10:18 AM      Passed - Patient is not pregnant      Passed - Valid encounter within last 12 months    Recent Outpatient Visits          3 months ago Type 2 diabetes with nephropathy ([place])   [place], [name], DO   6 months ago Hypertensive heart/kidney disease without HF and with CKD stage III ([place])   [place], [name], DO   7 months ago Hypertensive heart/kidney disease without HF and with CKD stage III ([place])   [place], [name], DO   7 months ago Hypertensive heart/kidney disease without HF and with CKD stage III ([place])   [place], [name], DO   8 months ago Routine general medical examination at a health care facility   [facility], [name], DO             . irbesartan (AVAPRO) 300 MG tablet [Pharmacy Med Name: IRBESARTAN 300 MG TABLET] 30 tablet 5    Sig: TAKE 1 TABLET BY MOUTH EVERY DAY     Cardiovascular:  Angiotensin Receptor Blockers Failed - 04/13/2019 10:18 AM      Failed - Cr in normal range and within 180 days    Creatinine, Ser  Date Value Ref Range Status  01/13/2019 1.88 (H) 0.76 - 1.27 mg/dL Final         Passed - K in normal range and within 180 days    Potassium  Date Value Ref Range Status  01/13/2019 4.1 3.5 - 5.2 mmol/L Final         Passed - Patient is not pregnant      Passed - Last BP in normal range    BP Readings from Last 1 Encounters:  01/02/19 128/81         Passed - Valid encounter within last 6 months    Recent Outpatient Visits          3 months ago Type 2 diabetes with nephropathy ([place])   [place], [name], DO   6 months ago Hypertensive  heart/kidney disease without HF and with CKD stage III ([place])   [facility], [name], DO   7 months ago Hypertensive heart/kidney disease without HF and with CKD stage III ([place])   [facility], [name], DO   7 months ago Hypertensive heart/kidney disease without HF and with CKD stage III ([employer])   [place], [name], DO   8 months ago Routine general medical examination at a health care facility   [facility], [place], DO

## 2019-04-20 ENCOUNTER — Other Ambulatory Visit: Payer: Self-pay | Admitting: Family Medicine

## 2019-05-09 ENCOUNTER — Other Ambulatory Visit: Payer: Self-pay | Admitting: Family Medicine

## 2019-05-13 ENCOUNTER — Other Ambulatory Visit: Payer: Self-pay | Admitting: Family Medicine

## 2019-05-13 NOTE — Telephone Encounter (Signed)
Lipid level due in July Requested Prescriptions  Pending Prescriptions Disp Refills  . atorvastatin (LIPITOR) 80 MG tablet [Pharmacy Med Name: ATORVASTATIN 80 MG TABLET] 90 tablet 0    Sig: TAKE 1 TABLET (80 MG TOTAL) BY MOUTH DAILY AT 6 PM.     Cardiovascular:  Antilipid - Statins Failed - 05/13/2019  5:10 PM      Failed - HDL in normal range and within 360 days    HDL  Date Value Ref Range Status  08/13/2018 37 (L) >39 mg/dL Final         Passed - Total Cholesterol in normal range and within 360 days    Cholesterol, Total  Date Value Ref Range Status  08/13/2018 147 100 - 199 mg/dL Final   Cholesterol Piccolo, Waived  Date Value Ref Range Status  11/19/2014 101 <200 mg/dL Final    Comment:                            Desirable                <200                         Borderline High      200- 239                         High                     >239          Passed - LDL in normal range and within 360 days    LDL Calculated  Date Value Ref Range Status  08/13/2018 85 0 - 99 mg/dL Final         Passed - Triglycerides in normal range and within 360 days    Triglycerides  Date Value Ref Range Status  08/13/2018 127 0 - 149 mg/dL Final   Triglycerides Piccolo,Waived  Date Value Ref Range Status  11/19/2014 61 <150 mg/dL Final    Comment:                            Normal                   <150                         Borderline High     150 - 199                         High                200 - 499                         Very High                >499          Passed - Patient is not pregnant      Passed - Valid encounter within last 12 months    Recent Outpatient Visits          4 months ago Type 2 diabetes with nephropathy ([place])   [place], [name], DO   7 months ago Hypertensive  heart/kidney disease without HF and with CKD stage III ([place])   [facility], [name], DO   8 months ago Hypertensive heart/kidney  disease without HF and with CKD stage III ([place])   [place], [name], DO   8 months ago Hypertensive heart/kidney disease without HF and with CKD stage III ([place])   [place], [name], DO   9 months ago Routine general medical examination at a health care facility   [facility], [name], DO             . hydrochlorothiazide (HYDRODIURIL) 25 MG tablet [Pharmacy Med Name: HYDROCHLOROTHIAZIDE 25 MG TAB] 90 tablet 0    Sig: TAKE 1 TABLET BY MOUTH EVERY DAY     Cardiovascular: Diuretics - Thiazide Failed - 05/13/2019  5:10 PM      Failed - Cr in normal range and within 360 days    Creatinine, Ser  Date Value Ref Range Status  01/13/2019 1.88 (H) 0.76 - 1.27 mg/dL Final         Passed - Ca in normal range and within 360 days    Calcium  Date Value Ref Range Status  01/13/2019 9.5 8.7 - 10.2 mg/dL Final         Passed - K in normal range and within 360 days    Potassium  Date Value Ref Range Status  01/13/2019 4.1 3.5 - 5.2 mmol/L Final         Passed - Na in normal range and within 360 days    Sodium  Date Value Ref Range Status  01/13/2019 143 134 - 144 mmol/L Final         Passed - Last BP in normal range    BP Readings from Last 1 Encounters:  01/02/19 128/81         Passed - Valid encounter within last 6 months    Recent Outpatient Visits          4 months ago Type 2 diabetes with nephropathy ([place])   [name], DO   7 months ago Hypertensive heart/kidney disease without HF and with CKD stage III ([place])   [facility], [name], DO   8 months ago Hypertensive heart/kidney disease without HF and with CKD stage III ([place])   [facility], [name], DO   8 months ago Hypertensive heart/kidney disease without HF and with CKD stage III ([place])   [place], [name], DO   9 months ago Routine general medical examination  at a health care facility   [facility], [place], DO

## 2019-05-13 NOTE — Telephone Encounter (Signed)
Pt due for Lipid level in July

## 2019-05-27 ENCOUNTER — Other Ambulatory Visit: Payer: Self-pay | Admitting: Family Medicine

## 2019-05-27 NOTE — Telephone Encounter (Signed)
Requested Prescriptions  Pending Prescriptions Disp Refills  . metFORMIN (GLUCOPHAGE-XR) 500 MG 24 hr tablet [Pharmacy Med Name: METFORMIN HCL ER 500 MG TABLET] 120 tablet 0    Sig: TAKE 2 TABLETS (1,000 MG TOTAL) BY MOUTH 2 (TWO) TIMES DAILY WITH A MEAL.     Endocrinology:  Diabetes - Biguanides Failed - 05/27/2019  9:00 AM      Failed - Cr in normal range and within 360 days    Creatinine, Ser  Date Value Ref Range Status  01/13/2019 1.88 (H) 0.76 - 1.27 mg/dL Final         Failed - eGFR in normal range and within 360 days    GFR calc Af Amer  Date Value Ref Range Status  01/13/2019 45 (L) >59 mL/min/1.73 Final   GFR calc non Af Amer  Date Value Ref Range Status  01/13/2019 39 (L) >59 mL/min/1.73 Final         Passed - HBA1C is between 0 and 7.9 and within 180 days    HB A1C (BAYER DCA - WAIVED)  Date Value Ref Range Status  01/13/2019 7.0 (H) <7.0 % Final    Comment:                                          Diabetic Adult            <7.0                                       Healthy Adult        4.3 - 5.7                                                           (DCCT/NGSP) American Diabetes Association's Summary of Glycemic Recommendations for Adults with Diabetes: Hemoglobin A1c <7.0%. More stringent glycemic goals (A1c <6.0%) may further reduce complications at the cost of increased risk of hypoglycemia.          Passed - Valid encounter within last 6 months    Recent Outpatient Visits          4 months ago Type 2 diabetes with nephropathy ([place])   [place], [name], DO   8 months ago Hypertensive heart/kidney disease without HF and with CKD stage III ([place])   [place], [name], DO   8 months ago Hypertensive heart/kidney disease without HF and with CKD stage III ([place])   [place], [name], DO   9 months ago Hypertensive heart/kidney disease without HF and with CKD stage III ([place])   [place], [name], DO   9 months ago Routine general medical examination at a health care facility   [facility], [place], [place]

## 2019-06-07 ENCOUNTER — Other Ambulatory Visit: Payer: Self-pay | Admitting: Family Medicine

## 2019-06-13 IMAGING — CR DG CHEST 2V
2 series · 2 of 2 positions shown · non-contrast
Comparison: 11/03/2016

CLINICAL DATA: Chest pain

EXAM:
CHEST - 2 VIEW

[chest pa]
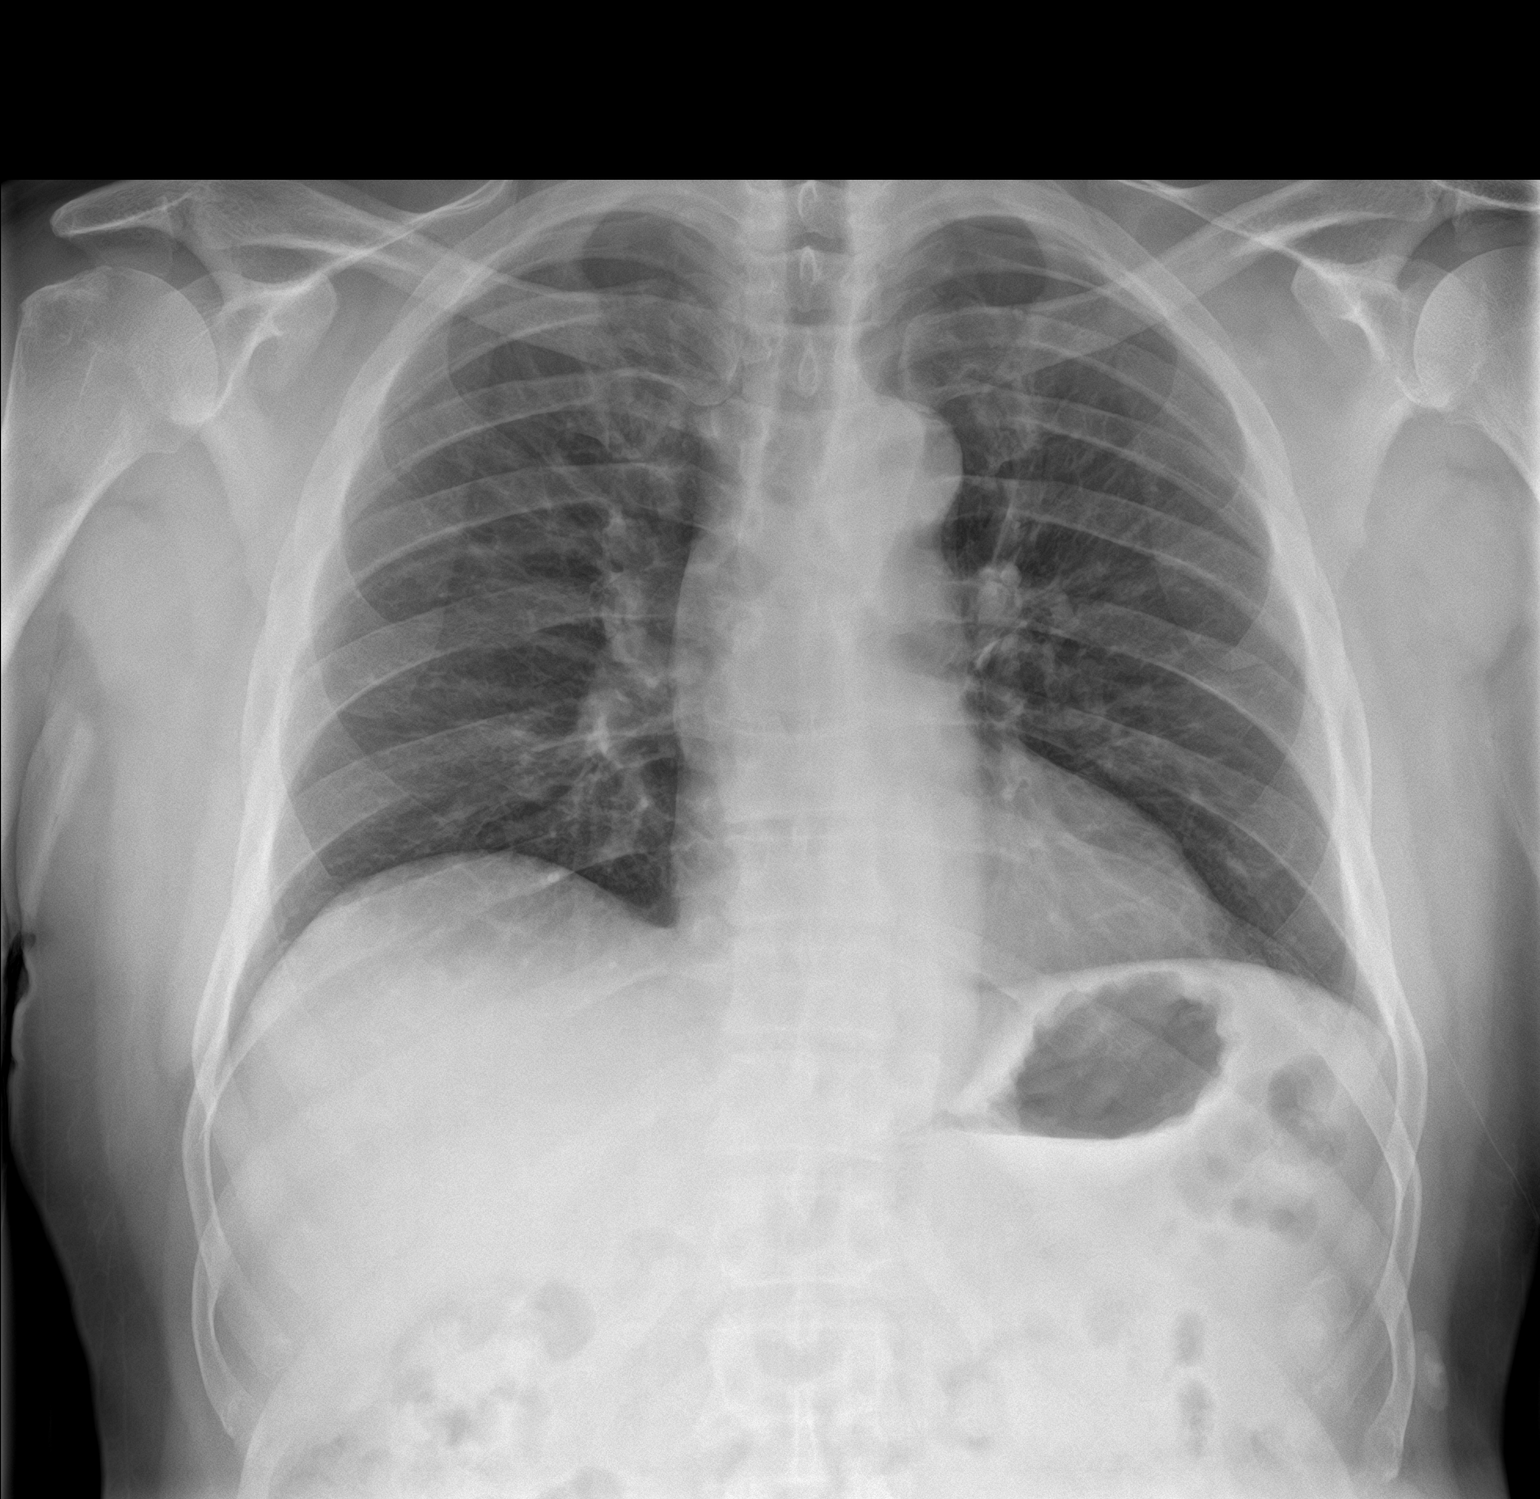

[chest lat]
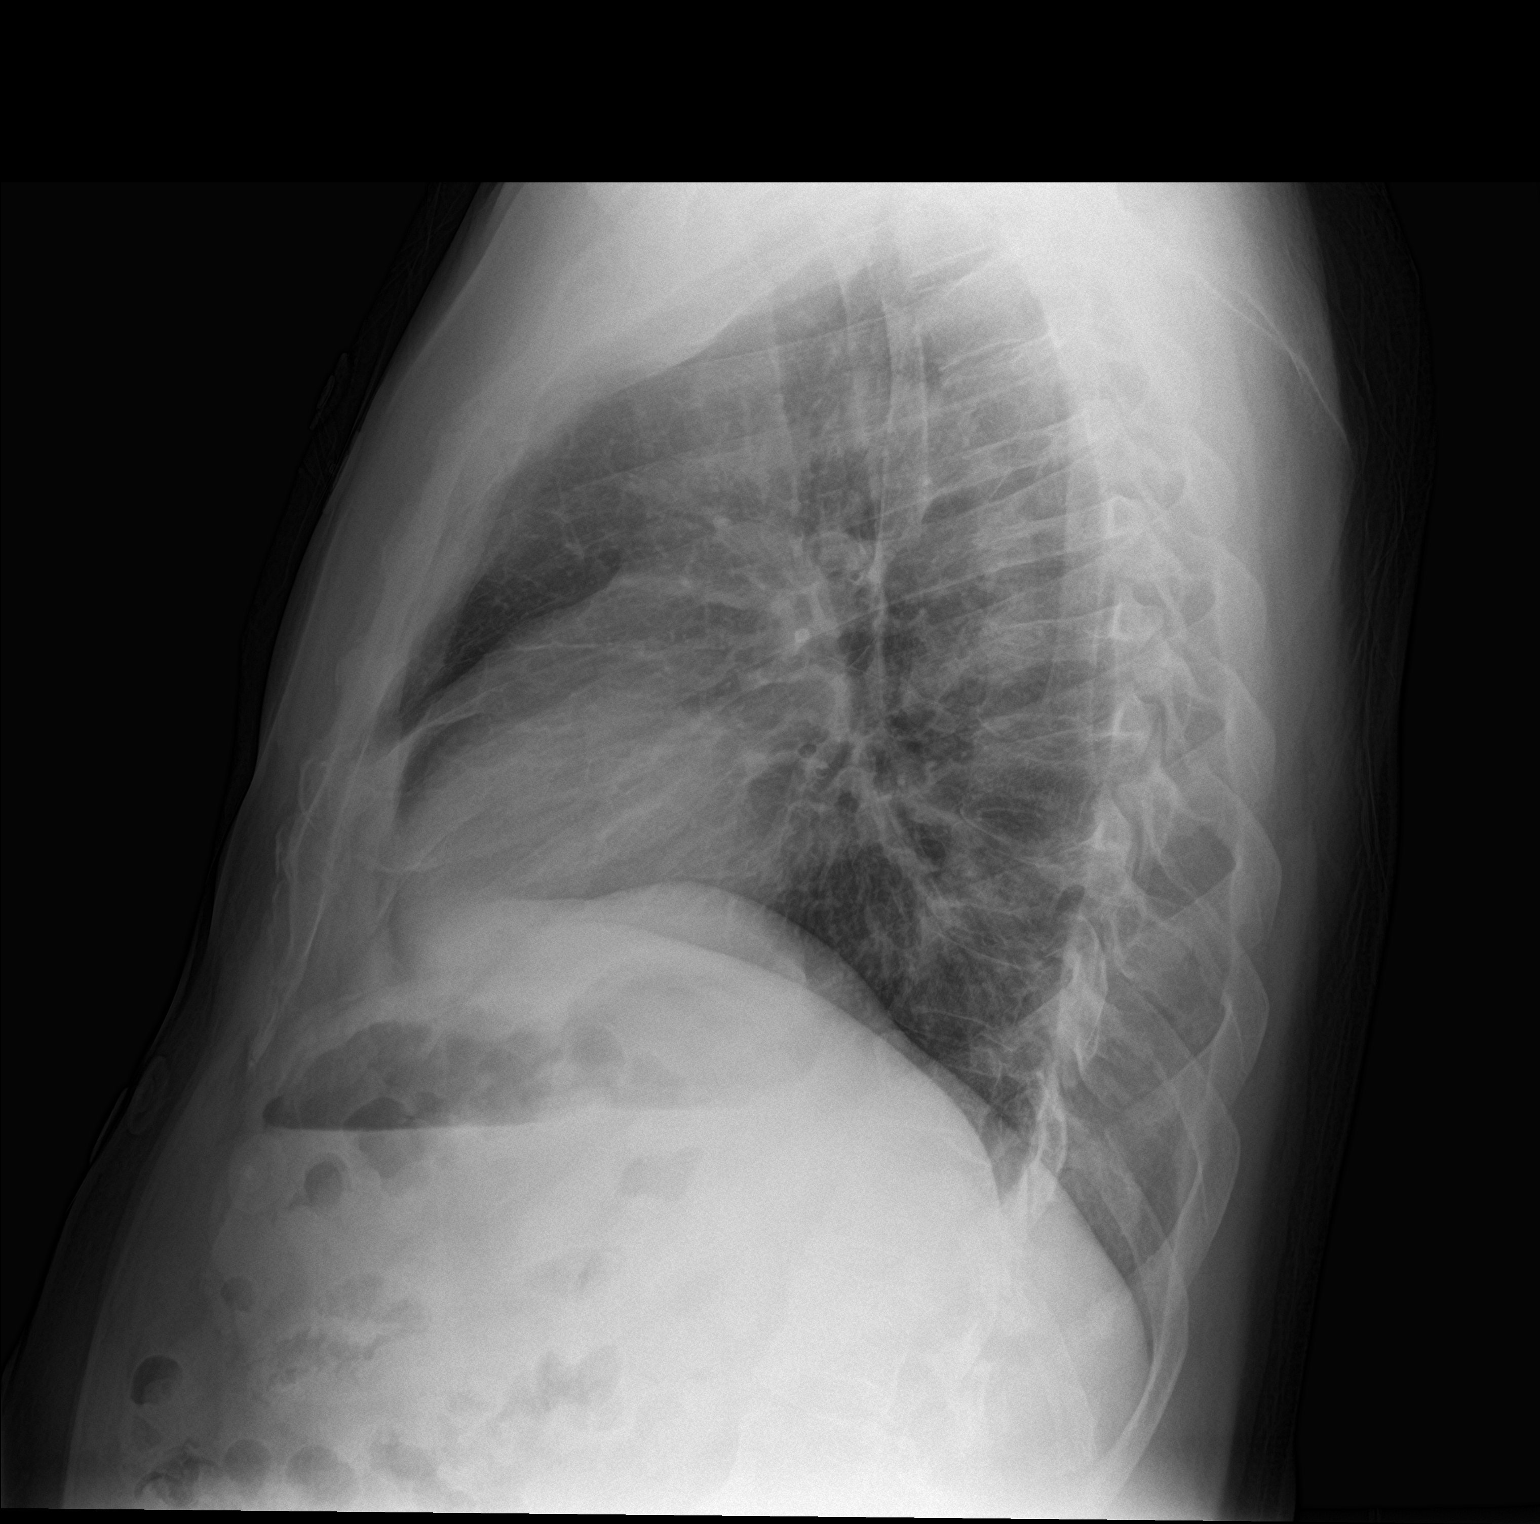

[2 of 2 positions shown; findings below may reference images not displayed]

FINDINGS: Low lung volumes with minor central bronchitic change. No focal
pneumonia, collapse or consolidation. Negative for edema, effusion
or pneumothorax. Trachea is midline. Normal heart size and
vascularity.
IMPRESSION: Low volume exam with minor central bronchitic change. No focal
pneumonia.

## 2019-06-29 ENCOUNTER — Other Ambulatory Visit: Payer: Self-pay | Admitting: Family Medicine

## 2019-06-29 NOTE — Telephone Encounter (Signed)
Requested Prescriptions  Pending Prescriptions Disp Refills   amLODipine (NORVASC) 10 MG tablet [Pharmacy Med Name: AMLODIPINE BESYLATE 10 MG TAB] 90 tablet 0    Sig: TAKE 1 TABLET BY MOUTH EVERY DAY     Cardiovascular:  Calcium Channel Blockers Passed - 06/29/2019 12:19 PM      Passed - Last BP in normal range    BP Readings from Last 1 Encounters:  01/02/19 128/81         Passed - Valid encounter within last 6 months    Recent Outpatient Visits          5 months ago Type 2 diabetes with nephropathy ([place])   [facility], [name], DO   9 months ago Hypertensive heart/kidney disease without HF and with CKD stage III ([place])   [place], [name], DO   9 months ago Hypertensive heart/kidney disease without HF and with CKD stage III ([place])   [place], [name], DO   10 months ago Hypertensive heart/kidney disease without HF and with CKD stage III ([place])   [place], [name], DO   10 months ago Routine general medical examination at a health care facility   [facility], [name], DO              metFORMIN (GLUCOPHAGE-XR) 500 MG 24 hr tablet [Pharmacy Med Name: METFORMIN HCL ER 500 MG TABLET] 360 tablet 0    Sig: TAKE 2 TABLETS (1,000 MG TOTAL) BY MOUTH 2 (TWO) TIMES DAILY WITH A MEAL.     Endocrinology:  Diabetes - Biguanides Failed - 06/29/2019 12:19 PM      Failed - Cr in normal range and within 360 days    Creatinine, Ser  Date Value Ref Range Status  01/13/2019 1.88 (H) 0.76 - 1.27 mg/dL Final         Failed - eGFR in normal range and within 360 days    GFR calc Af Amer  Date Value Ref Range Status  01/13/2019 45 (L) >59 mL/min/1.73 Final   GFR calc non Af Amer  Date Value Ref Range Status  01/13/2019 39 (L) >59 mL/min/1.73 Final         Passed - HBA1C is between 0 and 7.9 and within 180 days    HB A1C (BAYER DCA - WAIVED)  Date Value Ref Range Status   01/13/2019 7.0 (H) <7.0 % Final    Comment:                                          Diabetic Adult            <7.0                                       Healthy Adult        4.3 - 5.7                                                           (DCCT/NGSP) American Diabetes Association's Summary of Glycemic Recommendations for Adults with  Diabetes: Hemoglobin A1c <7.0%. More stringent glycemic goals (A1c <6.0%) may further reduce complications at the cost of increased risk of hypoglycemia.          Passed - Valid encounter within last 6 months    Recent Outpatient Visits          5 months ago Type 2 diabetes with nephropathy ([place])   [place], [name], DO   9 months ago Hypertensive heart/kidney disease without HF and with CKD stage III ([place])   [place], [name], DO   9 months ago Hypertensive heart/kidney disease without HF and with CKD stage III ([place])   [place], [name], DO   10 months ago Hypertensive heart/kidney disease without HF and with CKD stage III ([place])   [place], [name], DO   10 months ago Routine general medical examination at a health care facility   [facility], [place] P, DO              hydrochlorothiazide (HYDRODIURIL) 25 MG tablet [Pharmacy Med Name: HYDROCHLOROTHIAZIDE 25 MG TAB] 90 tablet 0    Sig: TAKE 1 TABLET BY MOUTH EVERY DAY     Cardiovascular: Diuretics - Thiazide Failed - 06/29/2019 12:19 PM      Failed - Cr in normal range and within 360 days    Creatinine, Ser  Date Value Ref Range Status  01/13/2019 1.88 (H) 0.76 - 1.27 mg/dL Final         Passed - Ca in normal range and within 360 days    Calcium  Date Value Ref Range Status  01/13/2019 9.5 8.7 - 10.2 mg/dL Final         Passed - K in normal range and within 360 days    Potassium  Date Value Ref Range Status  01/13/2019 4.1 3.5 - 5.2 mmol/L Final         Passed - Na  in normal range and within 360 days    Sodium  Date Value Ref Range Status  01/13/2019 143 134 - 144 mmol/L Final         Passed - Last BP in normal range    BP Readings from Last 1 Encounters:  01/02/19 128/81         Passed - Valid encounter within last 6 months    Recent Outpatient Visits          5 months ago Type 2 diabetes with nephropathy ([place])   [facility], [name], DO   9 months ago Hypertensive heart/kidney disease without HF and with CKD stage III ([place])   [facility], [name], DO   9 months ago Hypertensive heart/kidney disease without HF and with CKD stage III ([place])   [place], [name], DO   10 months ago Hypertensive heart/kidney disease without HF and with CKD stage III ([place])   [name], DO   10 months ago Routine general medical examination at a health care facility   [facility], [place], DO

## 2019-06-30 ENCOUNTER — Other Ambulatory Visit: Payer: Self-pay | Admitting: Family Medicine

## 2019-07-03 IMAGING — DX DG ANKLE COMPLETE 3+V*R*
3 series · 3 of 3 positions shown · non-contrast
Comparison: None.

CLINICAL DATA: Medial right ankle pain 2 days.  No injury.

EXAM:
RIGHT ANKLE - COMPLETE 3+ VIEW

[ankle ap]
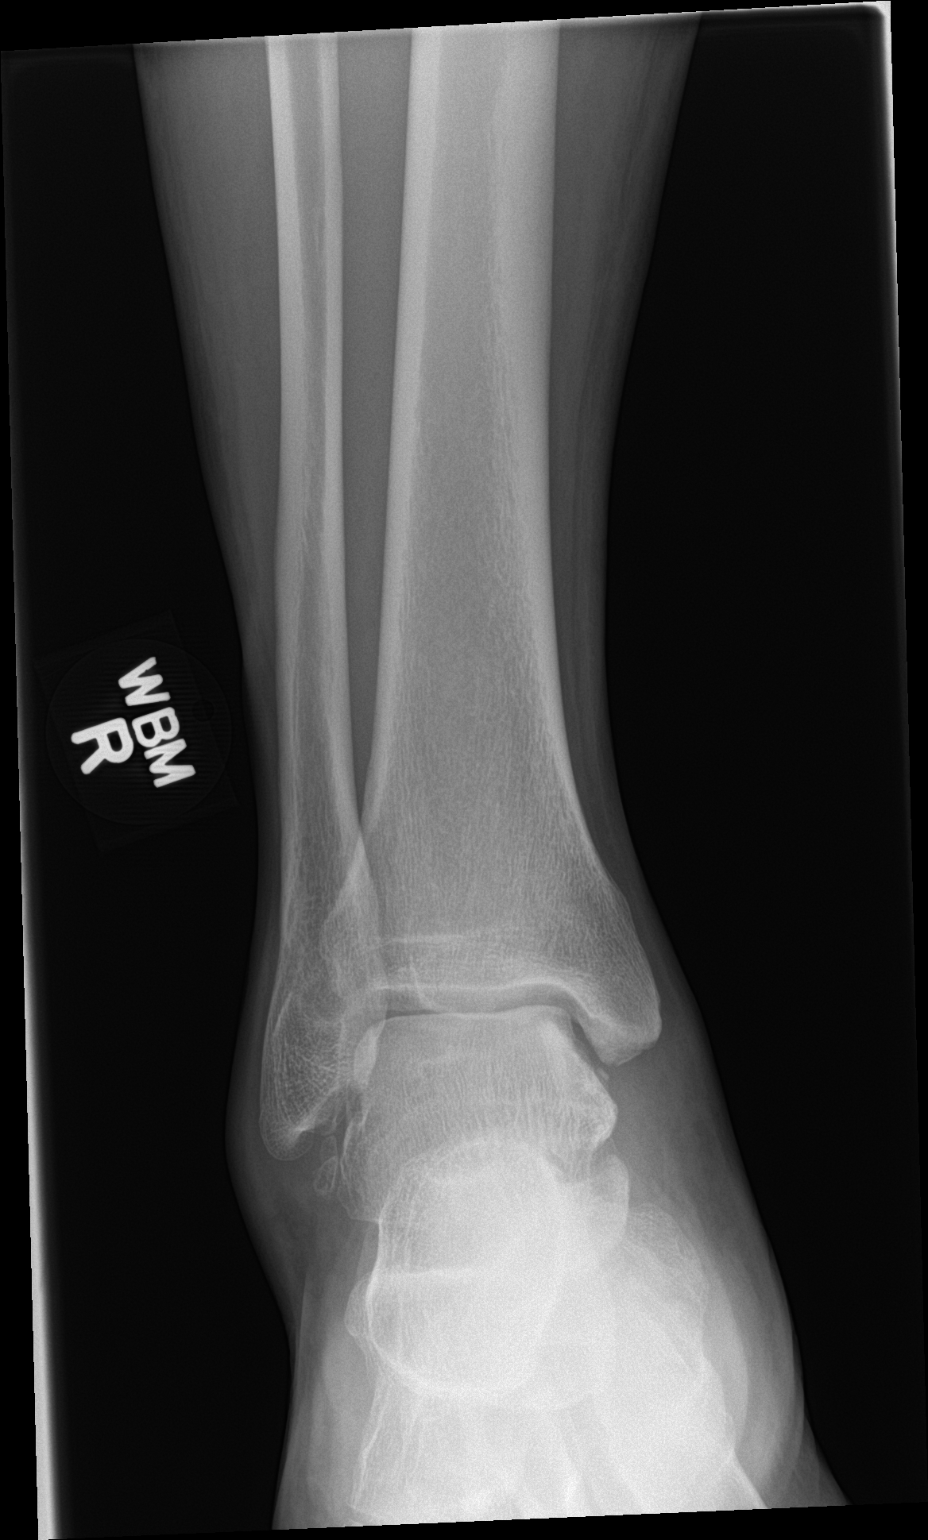

[ankle obl]
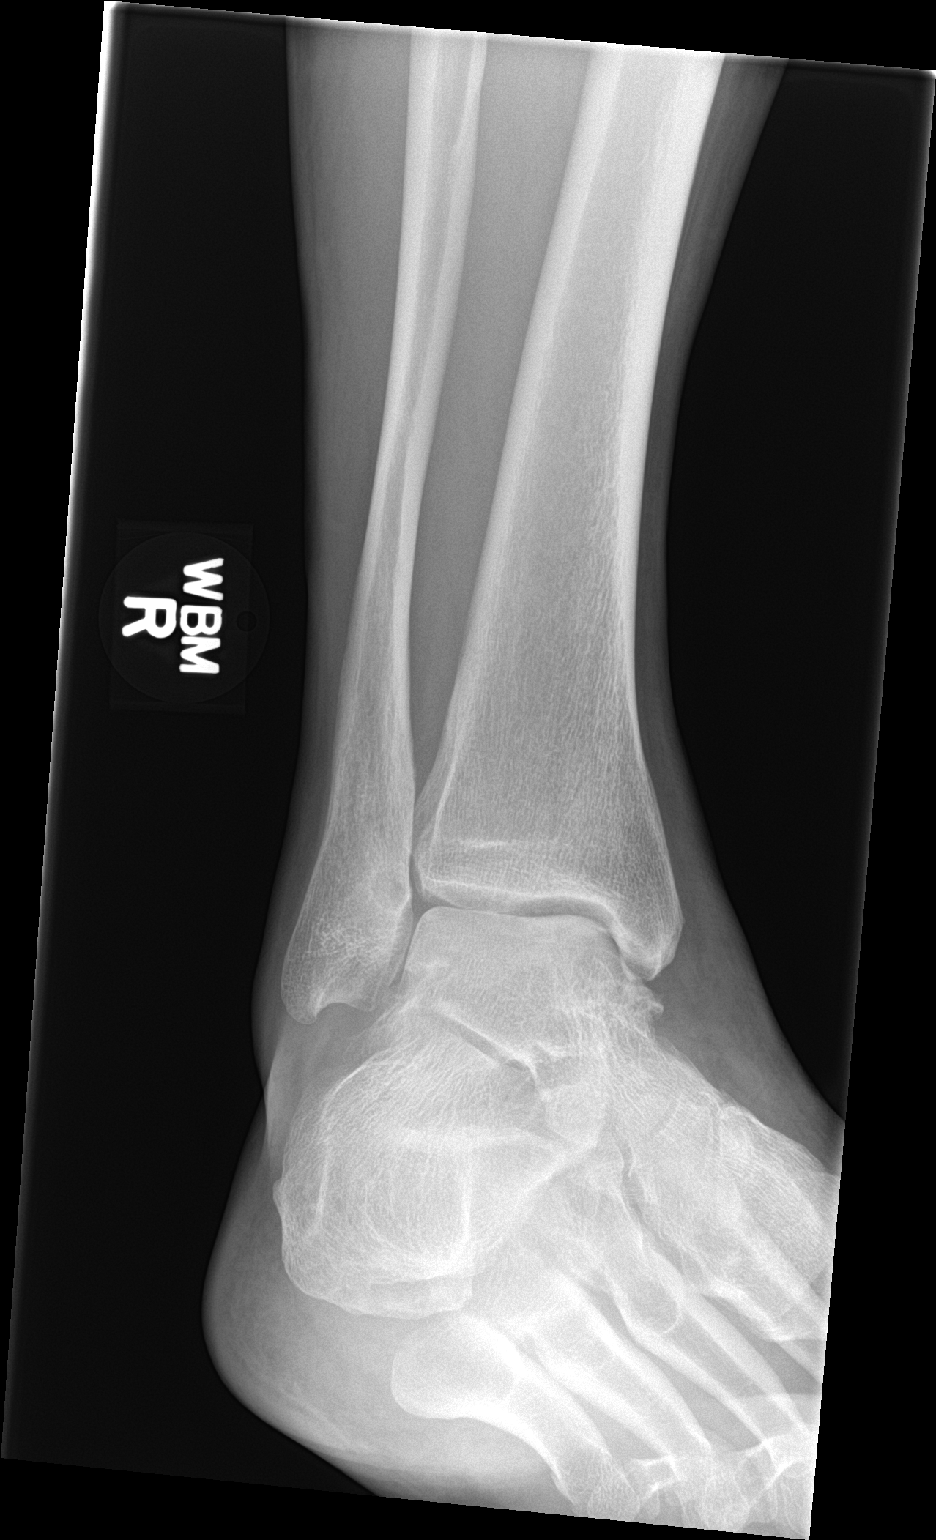

[ankle lat]
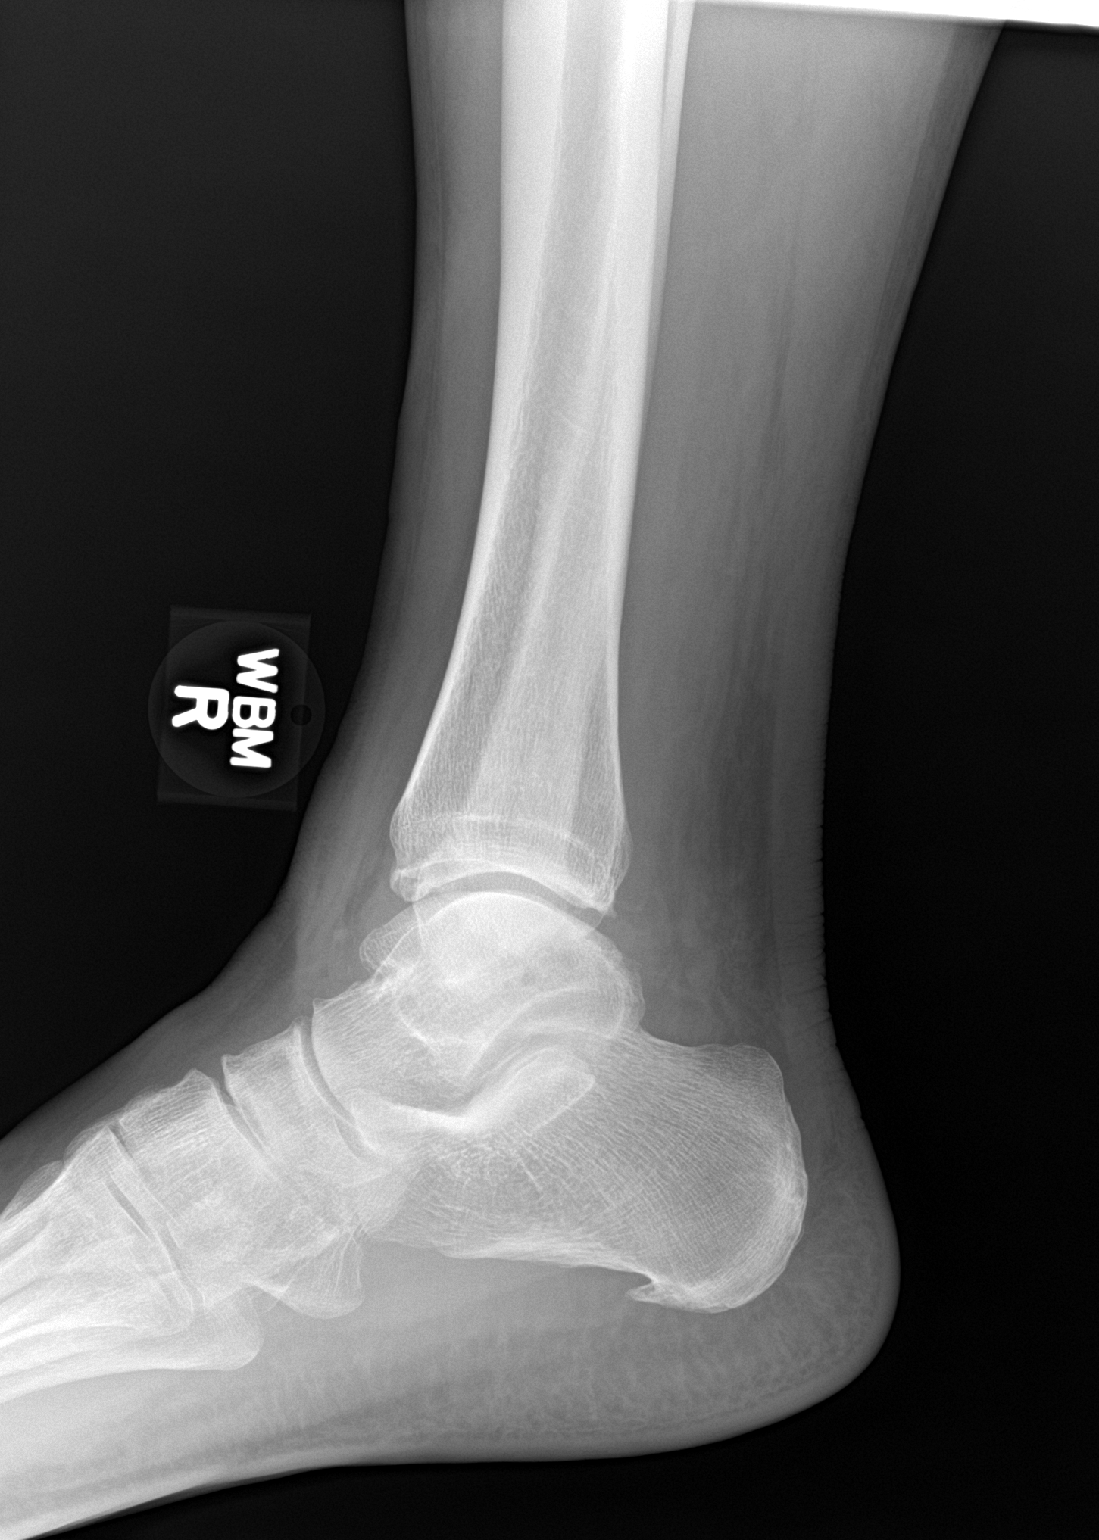

[3 of 3 positions shown; findings below may reference images not displayed]

FINDINGS: Ankle mortise within normal. Minimal degenerative change over the
tibiotalar joint. Small chronic fragments distal to the fibula. Mild
degenerate change over the midfoot/hindfoot region. Small inferior
calcaneal spur.
IMPRESSION: No acute findings.

Mild degenerative change over the ankle joint.

## 2019-07-17 ENCOUNTER — Other Ambulatory Visit: Payer: Self-pay | Admitting: Family Medicine

## 2019-07-25 ENCOUNTER — Other Ambulatory Visit: Payer: Self-pay | Admitting: Family Medicine

## 2019-07-25 NOTE — Telephone Encounter (Signed)
Requested Prescriptions  Pending Prescriptions Disp Refills   metFORMIN (GLUCOPHAGE-XR) 500 MG 24 hr tablet [Pharmacy Med Name: METFORMIN HCL ER 500 MG TABLET] 240 tablet 1    Sig: TAKE 2 TABLETS (1,000 MG TOTAL) BY MOUTH 2 (TWO) TIMES DAILY WITH A MEAL.     Endocrinology:  Diabetes - Biguanides Failed - 07/25/2019  3:02 PM      Failed - Cr in normal range and within 360 days    Creatinine, Ser  Date Value Ref Range Status  01/13/2019 1.88 (H) 0.76 - 1.27 mg/dL Final         Failed - HBA1C is between 0 and 7.9 and within 180 days    HB A1C (BAYER DCA - WAIVED)  Date Value Ref Range Status  01/13/2019 7.0 (H) <7.0 % Final    Comment:                                          Diabetic Adult            <7.0                                       Healthy Adult        4.3 - 5.7                                                           (DCCT/NGSP) American Diabetes Association's Summary of Glycemic Recommendations for Adults with Diabetes: Hemoglobin A1c <7.0%. More stringent glycemic goals (A1c <6.0%) may further reduce complications at the cost of increased risk of hypoglycemia.          Failed - eGFR in normal range and within 360 days    GFR calc Af Amer  Date Value Ref Range Status  01/13/2019 45 (L) >59 mL/min/1.73 Final   GFR calc non Af Amer  Date Value Ref Range Status  01/13/2019 39 (L) >59 mL/min/1.73 Final         Failed - Valid encounter within last 6 months    Recent Outpatient Visits          6 months ago Type 2 diabetes with nephropathy [facility])   [facility], [name], DO   10 months ago Hypertensive heart/kidney disease without HF and with CKD stage III ([place])   [place], [name], DO   10 months ago Hypertensive heart/kidney disease without HF and with CKD stage III [facility])   [place], [name], DO   11 months ago Hypertensive heart/kidney disease without HF and with CKD stage III ([place])   [place], [name], DO   11 months ago Routine general medical examination at a health care facility   [facility], [place], DO              Noted that pt. Is overdue for f/u appt.  Phone call to pt.  Left vm to call office to schedule f/u appt., as this is required to continue to get medication refilled by Dr. Wynetta Emery.  Noted that there have  been repeated pharmacy requests for refill on this medication.  Noted last refill sent on 06/29/19, #360; 0 refills.  Called [facility] in [place].  Pharmacist advised that due to [employer], the pt. Was given a 60 day supply (240 tablets) on 06/29/19.  The Pharmacist reported that the pt. Can receive a 30 day supply when he is ready for refill.  The Pharmacist stated that the request was automated due to the discrepancy in the amt. That was ordered by the PCP, and the amt. that was dispensed.

## 2019-08-12 ENCOUNTER — Ambulatory Visit: Payer: BC Managed Care – PPO | Admitting: Family Medicine

## 2019-08-14 ENCOUNTER — Ambulatory Visit (INDEPENDENT_AMBULATORY_CARE_PROVIDER_SITE_OTHER): Payer: BC Managed Care – PPO | Admitting: Family Medicine

## 2019-08-14 ENCOUNTER — Encounter: Payer: Self-pay | Admitting: Family Medicine

## 2019-08-14 ENCOUNTER — Other Ambulatory Visit: Payer: Self-pay

## 2019-08-14 VITALS — BP 132/80 | HR 81 | Temp 99.1°F | Ht 68.5 in | Wt 215.4 lb

## 2019-08-14 DIAGNOSIS — N183 Chronic kidney disease, stage 3 unspecified: Secondary | ICD-10-CM

## 2019-08-14 DIAGNOSIS — M10371 Gout due to renal impairment, right ankle and foot: Secondary | ICD-10-CM | POA: Diagnosis not present

## 2019-08-14 DIAGNOSIS — E782 Mixed hyperlipidemia: Secondary | ICD-10-CM | POA: Diagnosis not present

## 2019-08-14 DIAGNOSIS — Z0000 Encounter for general adult medical examination without abnormal findings: Secondary | ICD-10-CM | POA: Diagnosis not present

## 2019-08-14 DIAGNOSIS — E559 Vitamin D deficiency, unspecified: Secondary | ICD-10-CM

## 2019-08-14 DIAGNOSIS — N400 Enlarged prostate without lower urinary tract symptoms: Secondary | ICD-10-CM | POA: Diagnosis not present

## 2019-08-14 DIAGNOSIS — I1310 Hypertensive heart and chronic kidney disease without heart failure, with stage 1 through stage 4 chronic kidney disease, or unspecified chronic kidney disease: Secondary | ICD-10-CM

## 2019-08-14 DIAGNOSIS — E1121 Type 2 diabetes mellitus with diabetic nephropathy: Secondary | ICD-10-CM

## 2019-08-14 MED ORDER — ASPIRIN 81 MG PO TBEC: 81.0000 mg | DELAYED_RELEASE_TABLET | Freq: Every day | ORAL | 3 refills | Status: DC

## 2019-08-14 MED ORDER — METFORMIN HCL ER 500 MG PO TB24: 1000.0000 mg | ORAL_TABLET | Freq: Two times a day (BID) | ORAL | 1 refills | Status: DC

## 2019-08-14 MED ORDER — HYDRALAZINE HCL 100 MG PO TABS: 100.0000 mg | ORAL_TABLET | Freq: Two times a day (BID) | ORAL | 1 refills | Status: DC

## 2019-08-14 MED ORDER — METOPROLOL TARTRATE 25 MG PO TABS: 25.0000 mg | ORAL_TABLET | Freq: Two times a day (BID) | ORAL | 1 refills | Status: DC

## 2019-08-14 MED ORDER — SILDENAFIL CITRATE 100 MG PO TABS: 50.0000 mg | ORAL_TABLET | Freq: Every day | ORAL | 11 refills | Status: DC | PRN

## 2019-08-14 MED ORDER — IRBESARTAN 300 MG PO TABS: 300.0000 mg | ORAL_TABLET | Freq: Every day | ORAL | 1 refills | Status: DC

## 2019-08-14 MED ORDER — HYDROCHLOROTHIAZIDE 25 MG PO TABS: 25.0000 mg | ORAL_TABLET | Freq: Every day | ORAL | 1 refills | Status: DC

## 2019-08-14 MED ORDER — ATORVASTATIN CALCIUM 80 MG PO TABS: 80.0000 mg | ORAL_TABLET | Freq: Every day | ORAL | 1 refills | Status: DC

## 2019-08-14 MED ORDER — ALLOPURINOL 100 MG PO TABS: 100.0000 mg | ORAL_TABLET | Freq: Every day | ORAL | 1 refills | Status: DC

## 2019-08-14 MED ORDER — AMLODIPINE BESYLATE 10 MG PO TABS: 10.0000 mg | ORAL_TABLET | Freq: Every day | ORAL | 1 refills | Status: DC

## 2019-08-14 NOTE — Assessment & Plan Note (Signed)
Rechecking labs today. Await results. Treat as needed. Call with any concerns.  

## 2019-08-14 NOTE — Assessment & Plan Note (Signed)
Rechecking labs today. Await results. Call with any concerns.  

## 2019-08-14 NOTE — Progress Notes (Signed)
BP (!) 132/80 (BP Location: Left Arm, Patient Position: Sitting, Cuff Size: Normal)   Pulse 81   Temp 99.1 F (37.3 C) (Oral)   Ht 5' 8.5" (1.74 m)   Wt (!) 215 lb 6.4 oz (97.7 kg)   SpO2 96%   BMI 32.27 kg/m    Subjective:    Patient ID: Anthony Ouch., male    DOB: [birth year], 59 y.o.   MRN: [id number]  HPI: Anthony Chittick. is a 59 y.o. male presenting on 08/14/2019 for comprehensive medical examination. Current medical complaints include:  HYPERTENSION / HYPERLIPIDEMIA Satisfied with current treatment? yes Duration of hypertension: chronic BP monitoring frequency: not checking BP medication side effects: no Duration of hyperlipidemia: chronic Cholesterol medication side effects: no Cholesterol supplements: none Past cholesterol medications: atorvastatin Medication compliance: excellent compliance Aspirin: yes Recent stressors: no Recurrent headaches: no Visual changes: no Palpitations: no Dyspnea: no Chest pain: no Lower extremity edema: no Dizzy/lightheaded: no  DIABETES Hypoglycemic episodes:no Polydipsia/polyuria: no Visual disturbance: no Chest pain: no Paresthesias: no Glucose Monitoring: yes  Accucheck frequency: once a week  Fasting glucose: 120 Taking Insulin?: no Blood Pressure Monitoring: not checking Retinal Examination: Not up to Date Foot Exam: Done today Diabetic Education: Not Completed Pneumovax: Up to Date Influenza: Up to Date Aspirin: yes  Gout- no flares, tolerating medicine well. No concerns.   He currently lives with: wife Interim Problems from his last visit: no  Depression Screen done today and results listed below:  Depression screen [facility] 2/9 08/14/2019 08/13/2018 02/21/2018 09/10/2015  Decreased Interest 0 0 0 0  Down, Depressed, Hopeless 0 0 0 0  PHQ - 2 Score 0 0 0 0  Altered sleeping 0 0 - 0  Tired, decreased energy 0 0 - 0  Change in appetite 0 0 - 0  Feeling bad or failure about yourself  0 0 - 0  Trouble  concentrating 0 0 - 0  Moving slowly or fidgety/restless 0 0 - 0  Suicidal thoughts 0 0 - 0  PHQ-9 Score 0 0 - 0  Difficult doing work/chores Not difficult at all Not difficult at all - -    Past Medical History:  Past Medical History:  Diagnosis Date  . Diabetes mellitus without complication ([place])   . Enlarged prostate   . Gout   . Hyperlipidemia   . Hypertension   . IFG (impaired fasting glucose)   . Vitamin D deficiency     Surgical History:  Past Surgical History:  Procedure Laterality Date  . COLONOSCOPY WITH PROPOFOL N/A 10/18/2018   Procedure: COLONOSCOPY WITH PROPOFOL;  Surgeon: [name], MD;  Location: [facility] ENDOSCOPY;  Service: Gastroenterology;  Laterality: N/A;    Medications:  Current Outpatient Medications on File Prior to Visit  Medication Sig  . ACCU-CHEK FASTCLIX LANCETS MISC 1 each by Does not apply route QID.  Marland Kitchen blood glucose meter kit and supplies KIT Dispense based on patient and insurance preference. Use up to four times daily as directed. (FOR ICD-9 250.00, 250.01).  . Blood Glucose Monitoring Suppl (ACCU-CHEK GUIDE) w/Device KIT 1 each by Does not apply route QID.  Marland Kitchen Misc Natural Products (TART CHERRY ADVANCED PO) Take by mouth.   No current facility-administered medications on file prior to visit.    Allergies:  No Known Allergies  Social History:  Social History   Socioeconomic History  . Marital status: Married    Spouse name: Not on file  . Number of children: Not on file  .  Years of education: Not on file  . Highest education level: Not on file  Occupational History  . Not on file  Tobacco Use  . Smoking status: Former Smoker    Packs/day: 0.25    Years: 8.00    Pack years: 2.00    Types: Cigarettes    Quit date: 01/23/2014    Years since quitting: 5.5  . Smokeless tobacco: Never Used  Vaping Use  . Vaping Use: Never used  Substance and Sexual Activity  . Alcohol use: Yes    Alcohol/week: 0.0 standard drinks    Comment: on  occasion  . Drug use: No  . Sexual activity: Never  Other Topics Concern  . Not on file  Social History Narrative  . Not on file   Social Determinants of Health   Financial Resource Strain:   . Difficulty of Paying Living Expenses:   Food Insecurity:   . Worried About Charity fundraiser in the Last Year:   . Arboriculturist in the Last Year:   Transportation Needs:   . Film/video editor (Medical):   Marland Kitchen Lack of Transportation (Non-Medical):   Physical Activity:   . Days of Exercise per Week:   . Minutes of Exercise per Session:   Stress:   . Feeling of Stress :   Social Connections:   . Frequency of Communication with Friends and Family:   . Frequency of Social Gatherings with Friends and Family:   . Attends Religious Services:   . Active Member of Clubs or Organizations:   . Attends Archivist Meetings:   Marland Kitchen Marital Status:   Intimate Partner Violence:   . Fear of Current or Ex-Partner:   . Emotionally Abused:   Marland Kitchen Physically Abused:   . Sexually Abused:    Social History   Tobacco Use  Smoking Status Former Smoker  . Packs/day: 0.25  . Years: 8.00  . Pack years: 2.00  . Types: Cigarettes  . Quit date: 01/23/2014  . Years since quitting: 5.5  Smokeless Tobacco Never Used   Social History   Substance and Sexual Activity  Alcohol Use Yes  . Alcohol/week: 0.0 standard drinks   Comment: on occasion    Family History:  Family History  Problem Relation Age of Onset  . Hypertension Mother   . Diabetes Mother   . Alzheimer's disease Father   . Diabetes Sister   . Hypertension Brother   . Cancer Maternal Grandfather        Prostate  . Cancer Sister        Breast Cancer    Past medical history, surgical history, medications, allergies, family history and social history reviewed with patient today and changes made to appropriate areas of the chart.   Review of Systems  Constitutional: Negative.   HENT: Negative.   Eyes: Negative.     Respiratory: Negative.   Cardiovascular: Negative.   Gastrointestinal: Negative.   Genitourinary: Negative.   Musculoskeletal: Negative.   Skin: Negative.   Neurological: Negative.   Endo/Heme/Allergies: Negative.   Psychiatric/Behavioral: Negative.     All other ROS negative except what is listed above and in the HPI.      Objective:    BP (!) 132/80 (BP Location: Left Arm, Patient Position: Sitting, Cuff Size: Normal)   Pulse 81   Temp 99.1 F (37.3 C) (Oral)   Ht 5' 8.5" (1.74 m)   Wt (!) 215 lb 6.4 oz (97.7 kg)   SpO2 96%  BMI 32.27 kg/m   Wt Readings from Last 3 Encounters:  08/14/19 (!) 215 lb 6.4 oz (97.7 kg)  10/18/18 234 lb (106.1 kg)  10/01/18 235 lb 9.6 oz (106.9 kg)    Physical Exam Vitals and nursing note reviewed.  Constitutional:      General: He is not in acute distress.    Appearance: Normal appearance. He is obese. He is not ill-appearing, toxic-appearing or diaphoretic.  HENT:     Head: Normocephalic and atraumatic.     Right Ear: Tympanic membrane, ear canal and external ear normal. There is no impacted cerumen.     Left Ear: Tympanic membrane, ear canal and external ear normal. There is no impacted cerumen.     Nose: Nose normal. No congestion or rhinorrhea.     Mouth/Throat:     Mouth: Mucous membranes are moist.     Pharynx: Oropharynx is clear. No oropharyngeal exudate or posterior oropharyngeal erythema.  Eyes:     General: No scleral icterus.       Right eye: No discharge.        Left eye: No discharge.     Extraocular Movements: Extraocular movements intact.     Conjunctiva/sclera: Conjunctivae normal.     Pupils: Pupils are equal, round, and reactive to light.  Neck:     Vascular: No carotid bruit.  Cardiovascular:     Rate and Rhythm: Normal rate and regular rhythm.     Pulses: Normal pulses.     Heart sounds: No murmur heard.  No friction rub. No gallop.   Pulmonary:     Effort: Pulmonary effort is normal. No respiratory  distress.     Breath sounds: Normal breath sounds. No stridor. No wheezing, rhonchi or rales.  Chest:     Chest wall: No tenderness.  Abdominal:     General: Abdomen is flat. Bowel sounds are normal. There is no distension.     Palpations: Abdomen is soft. There is no mass.     Tenderness: There is no abdominal tenderness. There is no right CVA tenderness, left CVA tenderness, guarding or rebound.     Hernia: No hernia is present.  Genitourinary:    Comments: Genital exam deferred with shared decision making Musculoskeletal:        General: No swelling, tenderness, deformity or signs of injury.     Cervical back: Normal range of motion and neck supple. No rigidity. No muscular tenderness.     Right lower leg: No edema.     Left lower leg: No edema.  Lymphadenopathy:     Cervical: No cervical adenopathy.  Skin:    General: Skin is warm and dry.     Capillary Refill: Capillary refill takes less than 2 seconds.     Coloration: Skin is not jaundiced or pale.     Findings: No bruising, erythema, lesion or rash.  Neurological:     General: No focal deficit present.     Mental Status: He is alert and oriented to person, place, and time.     Cranial Nerves: No cranial nerve deficit.     Sensory: No sensory deficit.     Motor: No weakness.     Coordination: Coordination normal.     Gait: Gait normal.     Deep Tendon Reflexes: Reflexes normal.  Psychiatric:        Mood and Affect: Mood normal.        Behavior: Behavior normal.        Thought   Content: Thought content normal.        Judgment: Judgment normal.     Results for orders placed or performed in visit on 01/13/19  Bayer DCA Hb A1c Waived  Result Value Ref Range   HB A1C (BAYER DCA - WAIVED) 7.0 (H) <7.0 %  Basic Metabolic Panel (BMET)  Result Value Ref Range   Glucose 91 65 - 99 mg/dL   BUN 31 (H) 6 - 24 mg/dL   Creatinine, Ser 1.88 (H) 0.76 - 1.27 mg/dL   GFR calc non Af Amer 39 (L) >59 mL/min/1.73   GFR calc Af Amer  45 (L) >59 mL/min/1.73   BUN/Creatinine Ratio 16 9 - 20   Sodium 143 134 - 144 mmol/L   Potassium 4.1 3.5 - 5.2 mmol/L   Chloride 106 96 - 106 mmol/L   CO2 23 20 - 29 mmol/L   Calcium 9.5 8.7 - 10.2 mg/dL      Assessment & Plan:   Problem List Items Addressed This Visit      Cardiovascular and Mediastinum   Hypertensive heart/kidney disease without HF and with CKD stage III (HCC)    Under good control on current regimen. Continue current regimen. Continue to monitor. Call with any concerns. Refills given. Labs drawn today.       Relevant Medications   metoprolol tartrate (LOPRESSOR) 25 MG tablet   irbesartan (AVAPRO) 300 MG tablet   hydrochlorothiazide (HYDRODIURIL) 25 MG tablet   hydrALAZINE (APRESOLINE) 100 MG tablet   atorvastatin (LIPITOR) 80 MG tablet   amLODipine (NORVASC) 10 MG tablet   aspirin (ASPIRIN LOW DOSE) 81 MG EC tablet   sildenafil (VIAGRA) 100 MG tablet   Other Relevant Orders   CBC with Differential/Platelet   Comprehensive metabolic panel   TSH   Urinalysis, Routine w reflex microscopic   Urine Microalbumin w/creat. ratio     Endocrine   Type 2 diabetes with nephropathy (HCC)    Rechecking labs today. Await results. Treat as needed. Call with any concerns.       Relevant Medications   metFORMIN (GLUCOPHAGE-XR) 500 MG 24 hr tablet   irbesartan (AVAPRO) 300 MG tablet   atorvastatin (LIPITOR) 80 MG tablet   aspirin (ASPIRIN LOW DOSE) 81 MG EC tablet   Other Relevant Orders   CBC with Differential/Platelet   Comprehensive metabolic panel   Hgb A1c w/o eAG   TSH   Urinalysis, Routine w reflex microscopic   Urine Microalbumin w/creat. ratio     Musculoskeletal and Integument   Acute gout due to renal impairment involving toe of right foot    Under good control on current regimen. Continue current regimen. Continue to monitor. Call with any concerns. Refills given. Labs drawn today.       Relevant Medications   aspirin (ASPIRIN LOW DOSE) 81 MG  EC tablet   allopurinol (ZYLOPRIM) 100 MG tablet   Other Relevant Orders   CBC with Differential/Platelet   Comprehensive metabolic panel   TSH   Urinalysis, Routine w reflex microscopic   Urine Microalbumin w/creat. ratio   Uric acid     Genitourinary   Stage 3 chronic kidney disease    Rechecking labs today. Await results. Call with any concerns.       Relevant Orders   CBC with Differential/Platelet   Comprehensive metabolic panel   TSH   Urinalysis, Routine w reflex microscopic   Urine Microalbumin w/creat. ratio     Other   Hyperlipidemia    Under good   control on current regimen. Continue current regimen. Continue to monitor. Call with any concerns. Refills given. Labs drawn today.       Relevant Medications   metoprolol tartrate (LOPRESSOR) 25 MG tablet   irbesartan (AVAPRO) 300 MG tablet   hydrochlorothiazide (HYDRODIURIL) 25 MG tablet   hydrALAZINE (APRESOLINE) 100 MG tablet   atorvastatin (LIPITOR) 80 MG tablet   amLODipine (NORVASC) 10 MG tablet   aspirin (ASPIRIN LOW DOSE) 81 MG EC tablet   sildenafil (VIAGRA) 100 MG tablet   Other Relevant Orders   CBC with Differential/Platelet   Comprehensive metabolic panel   Lipid Panel w/o Chol/HDL Ratio   TSH   Urinalysis, Routine w reflex microscopic   Urine Microalbumin w/creat. ratio   Vitamin D deficiency    Rechecking labs today. Await results. Call with any concerns.       Relevant Orders   CBC with Differential/Platelet   Comprehensive metabolic panel   TSH   Urinalysis, Routine w reflex microscopic   VITAMIN D 25 Hydroxy (Vit-D Deficiency, Fractures)   Urine Microalbumin w/creat. ratio   Enlarged prostate    Rechecking labs today. Await results. Call with any concerns.       Relevant Orders   CBC with Differential/Platelet   Comprehensive metabolic panel   PSA   TSH   Urinalysis, Routine w reflex microscopic   Urine Microalbumin w/creat. ratio    Other Visit Diagnoses    Routine general  medical examination at a health care facility    -  Primary   Vaccines up to date/declined. Screening labs checked today. Colonoscopy up to date. Continue diet and exercise. Call with any concerns.    Relevant Orders   CBC with Differential/Platelet   Comprehensive metabolic panel   TSH   Urinalysis, Routine w reflex microscopic   Urine Microalbumin w/creat. ratio       Discussed aspirin prophylaxis for myocardial infarction prevention and decision was made to continue ASA  LABORATORY TESTING:  Health maintenance labs ordered today as discussed above.   The natural history of prostate cancer and ongoing controversy regarding screening and potential treatment outcomes of prostate cancer has been discussed with the patient. The meaning of a false positive PSA and a false negative PSA has been discussed. He indicates understanding of the limitations of this screening test and wishes to proceed with screening PSA testing.   IMMUNIZATIONS:   - Tdap: Tetanus vaccination status reviewed: last tetanus booster within 10 years. - Influenza: Postponed to flu season - Pneumovax: Up to date - Prevnar: Not applicable - COVID: Declined  SCREENING: - Colonoscopy: Up to date  Discussed with patient purpose of the colonoscopy is to detect colon cancer at curable precancerous or early stages   PATIENT COUNSELING:    Sexuality: Discussed sexually transmitted diseases, partner selection, use of condoms, avoidance of unintended pregnancy  and contraceptive alternatives.   Advised to avoid cigarette smoking.  I discussed with the patient that most people either abstain from alcohol or drink within safe limits (<=14/week and <=4 drinks/occasion for males, <=7/weeks and <= 3 drinks/occasion for females) and that the risk for alcohol disorders and other health effects rises proportionally with the number of drinks per week and how often a drinker exceeds daily limits.  Discussed cessation/primary  prevention of drug use and availability of treatment for abuse.   Diet: Encouraged to adjust caloric intake to maintain  or achieve ideal body weight, to reduce intake of dietary saturated fat and total fat, to  limit sodium intake by avoiding high sodium foods and not adding table salt, and to maintain adequate dietary potassium and calcium preferably from fresh fruits, vegetables, and low-fat dairy products.    stressed the importance of regular exercise  Injury prevention: Discussed safety belts, safety helmets, smoke detector, smoking near bedding or upholstery.   Dental health: Discussed importance of regular tooth brushing, flossing, and dental visits.   Follow up plan: NEXT PREVENTATIVE PHYSICAL DUE IN 1 YEAR. Return in about 3 months (around 11/14/2019).  

## 2019-08-14 NOTE — Patient Instructions (Signed)

## 2019-08-14 NOTE — Assessment & Plan Note (Signed)
Under good control on current regimen. Continue current regimen. Continue to monitor. Call with any concerns. Refills given. Labs drawn today.   

## 2019-08-15 LAB — URIC ACID: Uric Acid: 9.7 mg/dL — ABNORMAL HIGH (ref 3.8–8.4)

## 2019-08-15 LAB — MICROSCOPIC EXAMINATION
Bacteria, UA: NONE SEEN
Casts: NONE SEEN /lpf
Epithelial Cells (non renal): NONE SEEN /hpf (ref 0–10)
RBC, Urine: NONE SEEN /hpf (ref 0–2)
WBC, UA: NONE SEEN /hpf (ref 0–5)

## 2019-08-15 LAB — VITAMIN D 25 HYDROXY (VIT D DEFICIENCY, FRACTURES): Vit D, 25-Hydroxy: 21.3 ng/mL — ABNORMAL LOW (ref 30.0–100.0)

## 2019-08-15 LAB — URINALYSIS, ROUTINE W REFLEX MICROSCOPIC
Bilirubin, UA: NEGATIVE
Glucose, UA: NEGATIVE
Leukocytes,UA: NEGATIVE
Nitrite, UA: NEGATIVE
RBC, UA: NEGATIVE
Specific Gravity, UA: 1.028 (ref 1.005–1.030)
Urobilinogen, Ur: 1 mg/dL (ref 0.2–1.0)
pH, UA: 5 (ref 5.0–7.5)

## 2019-08-15 LAB — COMPREHENSIVE METABOLIC PANEL
ALT: 11 IU/L (ref 0–44)
AST: 16 IU/L (ref 0–40)
Albumin/Globulin Ratio: 2.2 (ref 1.2–2.2)
Albumin: 4.8 g/dL (ref 3.8–4.9)
Alkaline Phosphatase: 67 IU/L (ref 48–121)
BUN/Creatinine Ratio: 17 (ref 9–20)
BUN: 31 mg/dL — ABNORMAL HIGH (ref 6–24)
Bilirubin Total: 0.4 mg/dL (ref 0.0–1.2)
CO2: 23 mmol/L (ref 20–29)
Calcium: 9.2 mg/dL (ref 8.7–10.2)
Chloride: 103 mmol/L (ref 96–106)
Creatinine, Ser: 1.83 mg/dL — ABNORMAL HIGH (ref 0.76–1.27)
GFR calc Af Amer: 46 mL/min/{1.73_m2} — ABNORMAL LOW (ref >59)
GFR calc non Af Amer: 40 mL/min/{1.73_m2} — ABNORMAL LOW (ref >59)
Globulin, Total: 2.2 g/dL (ref 1.5–4.5)
Glucose: 93 mg/dL (ref 65–99)
Potassium: 3.9 mmol/L (ref 3.5–5.2)
Sodium: 142 mmol/L (ref 134–144)
Total Protein: 7 g/dL (ref 6.0–8.5)

## 2019-08-15 LAB — LIPID PANEL W/O CHOL/HDL RATIO
Cholesterol, Total: 133 mg/dL (ref 100–199)
HDL: 30 mg/dL — ABNORMAL LOW (ref >39)
LDL Chol Calc (NIH): 73 mg/dL (ref 0–99)
Triglycerides: 174 mg/dL — ABNORMAL HIGH (ref 0–149)
VLDL Cholesterol Cal: 30 mg/dL (ref 5–40)

## 2019-08-15 LAB — CBC WITH DIFFERENTIAL/PLATELET
Basophils Absolute: 0 10*3/uL (ref 0.0–0.2)
Basos: 1 %
EOS (ABSOLUTE): 0.4 10*3/uL (ref 0.0–0.4)
Eos: 6 %
Hematocrit: 40 % (ref 37.5–51.0)
Hemoglobin: 13.3 g/dL (ref 13.0–17.7)
Immature Grans (Abs): 0 10*3/uL (ref 0.0–0.1)
Immature Granulocytes: 0 %
Lymphocytes Absolute: 1.6 10*3/uL (ref 0.7–3.1)
Lymphs: 21 %
MCH: 27.8 pg (ref 26.6–33.0)
MCHC: 33.3 g/dL (ref 31.5–35.7)
MCV: 84 fL (ref 79–97)
Monocytes Absolute: 0.8 10*3/uL (ref 0.1–0.9)
Monocytes: 10 %
Neutrophils Absolute: 5 10*3/uL (ref 1.4–7.0)
Neutrophils: 62 %
Platelets: 257 10*3/uL (ref 150–450)
RBC: 4.79 x10E6/uL (ref 4.14–5.80)
RDW: 13.8 % (ref 11.6–15.4)
WBC: 7.8 10*3/uL (ref 3.4–10.8)

## 2019-08-15 LAB — PSA: Prostate Specific Ag, Serum: 1.5 ng/mL (ref 0.0–4.0)

## 2019-08-15 LAB — TSH: TSH: 1.68 u[IU]/mL (ref 0.450–4.500)

## 2019-08-15 LAB — MICROALBUMIN / CREATININE URINE RATIO
Creatinine, Urine: 316.2 mg/dL
Microalb/Creat Ratio: 92 mg/g creat — ABNORMAL HIGH (ref 0–29)
Microalbumin, Urine: 289.8 ug/mL

## 2019-08-15 LAB — HGB A1C W/O EAG: Hgb A1c MFr Bld: 6 % — ABNORMAL HIGH (ref 4.8–5.6)

## 2019-08-29 ENCOUNTER — Other Ambulatory Visit: Payer: Self-pay | Admitting: Family Medicine

## 2019-09-22 ENCOUNTER — Other Ambulatory Visit: Payer: Self-pay | Admitting: Family Medicine

## 2019-10-21 ENCOUNTER — Other Ambulatory Visit: Payer: Self-pay | Admitting: Family Medicine

## 2019-11-13 ENCOUNTER — Ambulatory Visit (INDEPENDENT_AMBULATORY_CARE_PROVIDER_SITE_OTHER): Payer: BC Managed Care – PPO | Admitting: Family Medicine

## 2019-11-13 ENCOUNTER — Other Ambulatory Visit: Payer: Self-pay

## 2019-11-13 ENCOUNTER — Encounter: Payer: Self-pay | Admitting: Family Medicine

## 2019-11-13 VITALS — BP 133/84 | HR 85 | Temp 98.0°F | Resp 16 | Wt 214.0 lb

## 2019-11-13 DIAGNOSIS — Z23 Encounter for immunization: Secondary | ICD-10-CM | POA: Diagnosis not present

## 2019-11-13 DIAGNOSIS — E1121 Type 2 diabetes mellitus with diabetic nephropathy: Secondary | ICD-10-CM | POA: Diagnosis not present

## 2019-11-13 MED ORDER — METFORMIN HCL ER 500 MG PO TB24: 1000.0000 mg | ORAL_TABLET | Freq: Every day | ORAL | 1 refills | Status: DC

## 2019-11-13 NOTE — Assessment & Plan Note (Signed)
Doing great with a1c of 5.5. Will decrease metformin to 1000mg  daily and recheck 3 months. Call with any concerns.

## 2019-11-13 NOTE — Progress Notes (Signed)
BP 133/84 (BP Location: Left Arm, Patient Position: Sitting, Cuff Size: Normal)   Pulse 85   Temp 98 F (36.7 C) (Oral)   Resp 16   Wt 214 lb (97.1 kg)   SpO2 97%   BMI 32.06 kg/m    Subjective:    Patient ID: Anthony Sellers., male    DOB: 03/13/1960, 59 y.o.   MRN: [id number]  HPI: Anthony Sellers. is a 59 y.o. male  Chief Complaint  Patient presents with  . Diabetes   DIABETES Hypoglycemic episodes:no Polydipsia/polyuria: no Visual disturbance: no Chest pain: no Paresthesias: no Glucose Monitoring: no  Accucheck frequency: Not Checking Taking Insulin?: no Blood Pressure Monitoring: not checking Retinal Examination: Not up to Date Foot Exam: Up to Date Diabetic Education: Completed Pneumovax: Up to Date Influenza: Up to Date Aspirin: no  Relevant past medical, surgical, family and social history reviewed and updated as indicated. Interim medical history since our last visit reviewed. Allergies and medications reviewed and updated.  Review of Systems  Constitutional: Negative.   Respiratory: Negative.   Cardiovascular: Negative.   Musculoskeletal: Negative.   Neurological: Negative.   Psychiatric/Behavioral: Negative.     Per HPI unless specifically indicated above     Objective:    BP 133/84 (BP Location: Left Arm, Patient Position: Sitting, Cuff Size: Normal)   Pulse 85   Temp 98 F (36.7 C) (Oral)   Resp 16   Wt 214 lb (97.1 kg)   SpO2 97%   BMI 32.06 kg/m   Wt Readings from Last 3 Encounters:  11/13/19 214 lb (97.1 kg)  08/14/19 (!) 215 lb 6.4 oz (97.7 kg)  10/18/18 234 lb (106.1 kg)    Physical Exam Vitals and nursing note reviewed.  Constitutional:      General: He is not in acute distress.    Appearance: Normal appearance. He is not ill-appearing, toxic-appearing or diaphoretic.  HENT:     Head: Normocephalic and atraumatic.     Right Ear: External ear normal.     Left Ear: External ear normal.     Nose: Nose normal.      Mouth/Throat:     Mouth: Mucous membranes are moist.     Pharynx: Oropharynx is clear.  Eyes:     General: No scleral icterus.       Right eye: No discharge.        Left eye: No discharge.     Extraocular Movements: Extraocular movements intact.     Conjunctiva/sclera: Conjunctivae normal.     Pupils: Pupils are equal, round, and reactive to light.  Cardiovascular:     Rate and Rhythm: Normal rate and regular rhythm.     Pulses: Normal pulses.     Heart sounds: Normal heart sounds. No murmur heard.  No friction rub. No gallop.   Pulmonary:     Effort: Pulmonary effort is normal. No respiratory distress.     Breath sounds: Normal breath sounds. No stridor. No wheezing, rhonchi or rales.  Chest:     Chest wall: No tenderness.  Musculoskeletal:        General: Normal range of motion.     Cervical back: Normal range of motion and neck supple.  Skin:    General: Skin is warm and dry.     Capillary Refill: Capillary refill takes less than 2 seconds.     Coloration: Skin is not jaundiced or pale.     Findings: No bruising, erythema, lesion or rash.  Neurological:     General: No focal deficit present.     Mental Status: He is alert and oriented to person, place, and time. Mental status is at baseline.  Psychiatric:        Mood and Affect: Mood normal.        Behavior: Behavior normal.        Thought Content: Thought content normal.        Judgment: Judgment normal.     Results for orders placed or performed in visit on 08/14/19  Microscopic Examination  Result Value Ref Range   WBC, UA None seen 0 - 5 /hpf   RBC None seen 0 - 2 /hpf   Epithelial Cells (non renal) None seen 0 - 10 /hpf   Casts None seen None seen /lpf   Bacteria, UA None seen None seen/Few  CBC with Differential/Platelet  Result Value Ref Range   WBC 7.8 3.4 - 10.8 x10E3/uL   RBC 4.79 4.14 - 5.80 x10E6/uL   Hemoglobin 13.3 13.0 - 17.7 g/dL   Hematocrit 40.0 37.5 - 51.0 %   MCV 84 79 - 97 fL   MCH 27.8  26.6 - 33.0 pg   MCHC 33.3 31 - 35 g/dL   RDW 13.8 11.6 - 15.4 %   Platelets 257 150 - 450 x10E3/uL   Neutrophils 62 Not Estab. %   Lymphs 21 Not Estab. %   Monocytes 10 Not Estab. %   Eos 6 Not Estab. %   Basos 1 Not Estab. %   Neutrophils Absolute 5.0 1.40 - 7.00 x10E3/uL   Lymphocytes Absolute 1.6 0 - 3 x10E3/uL   Monocytes Absolute 0.8 0 - 0 x10E3/uL   EOS (ABSOLUTE) 0.4 0.0 - 0.4 x10E3/uL   Basophils Absolute 0.0 0 - 0 x10E3/uL   Immature Granulocytes 0 Not Estab. %   Immature Grans (Abs) 0.0 0.0 - 0.1 x10E3/uL  Comprehensive metabolic panel  Result Value Ref Range   Glucose 93 65 - 99 mg/dL   BUN 31 (H) 6 - 24 mg/dL   Creatinine, Ser 1.83 (H) 0.76 - 1.27 mg/dL   GFR calc non Af Amer 40 (L) >59 mL/min/1.73   GFR calc Af Amer 46 (L) >59 mL/min/1.73   BUN/Creatinine Ratio 17 9 - 20   Sodium 142 134 - 144 mmol/L   Potassium 3.9 3.5 - 5.2 mmol/L   Chloride 103 96 - 106 mmol/L   CO2 23 20 - 29 mmol/L   Calcium 9.2 8.7 - 10.2 mg/dL   Total Protein 7.0 6.0 - 8.5 g/dL   Albumin 4.8 3.8 - 4.9 g/dL   Globulin, Total 2.2 1.5 - 4.5 g/dL   Albumin/Globulin Ratio 2.2 1.2 - 2.2   Bilirubin Total 0.4 0.0 - 1.2 mg/dL   Alkaline Phosphatase 67 48 - 121 IU/L   AST 16 0 - 40 IU/L   ALT 11 0 - 44 IU/L  Hgb A1c w/o eAG  Result Value Ref Range   Hgb A1c MFr Bld 6.0 (H) 4.8 - 5.6 %  Lipid Panel w/o Chol/HDL Ratio  Result Value Ref Range   Cholesterol, Total 133 100 - 199 mg/dL   Triglycerides 174 (H) 0 - 149 mg/dL   HDL 30 (L) >39 mg/dL   VLDL Cholesterol Cal 30 5 - 40 mg/dL   LDL Chol Calc (NIH) 73 0 - 99 mg/dL  PSA  Result Value Ref Range   Prostate Specific Ag, Serum 1.5 0.0 - 4.0 ng/mL  TSH  Result  Value Ref Range   TSH 1.680 0.450 - 4.500 uIU/mL  Urinalysis, Routine w reflex microscopic  Result Value Ref Range   Specific Gravity, UA 1.028 1.005 - 1.030   pH, UA 5.0 5.0 - 7.5   Color, UA Yellow Yellow   Appearance Ur Clear Clear   Leukocytes,UA Negative Negative    Protein,UA 2+ (A) Negative/Trace   Glucose, UA Negative Negative   Ketones, UA Trace (A) Negative   RBC, UA Negative Negative   Bilirubin, UA Negative Negative   Urobilinogen, Ur 1.0 0.2 - 1.0 mg/dL   Nitrite, UA Negative Negative   Microscopic Examination See below:   VITAMIN D 25 Hydroxy (Vit-D Deficiency, Fractures)  Result Value Ref Range   Vit D, 25-Hydroxy 21.3 (L) 30.0 - 100.0 ng/mL  Urine Microalbumin w/creat. ratio  Result Value Ref Range   Creatinine, Urine 316.2 Not Estab. mg/dL   Microalbumin, Urine 289.8 Not Estab. ug/mL   Microalb/Creat Ratio 92 (H) 0 - 29 mg/g creat  Uric acid  Result Value Ref Range   Uric Acid 9.7 (H) 3.8 - 8.4 mg/dL      Assessment & Plan:   Problem List Items Addressed This Visit      Endocrine   Type 2 diabetes with nephropathy ([place]) - Primary    Doing great with a1c of 5.5. Will decrease metformin to 1000mg  daily and recheck 3 months. Call with any concerns.       Relevant Medications   metFORMIN (GLUCOPHAGE-XR) 500 MG 24 hr tablet   Other Relevant Orders   Bayer DCA Hb A1c Waived    Other Visit Diagnoses    Needs flu shot       Flu shot given today.   Relevant Orders   Flu Vaccine QUAD 6+ mos PF IM (Fluarix Quad PF)       Follow up plan: Return in about 3 months (around 02/13/2020) for 6 month follow up.

## 2019-11-14 LAB — BAYER DCA HB A1C WAIVED: HB A1C (BAYER DCA - WAIVED): 5.5 % (ref <7.0)

## 2020-01-27 DIAGNOSIS — Z03818 Encounter for observation for suspected exposure to other biological agents ruled out: Secondary | ICD-10-CM | POA: Diagnosis not present

## 2020-01-27 DIAGNOSIS — Z20822 Contact with and (suspected) exposure to covid-19: Secondary | ICD-10-CM | POA: Diagnosis not present

## 2020-01-27 DIAGNOSIS — U071 COVID-19: Secondary | ICD-10-CM | POA: Diagnosis not present

## 2020-02-16 ENCOUNTER — Encounter: Payer: Self-pay | Admitting: Family Medicine

## 2020-02-16 ENCOUNTER — Ambulatory Visit (INDEPENDENT_AMBULATORY_CARE_PROVIDER_SITE_OTHER): Payer: BC Managed Care – PPO | Admitting: Family Medicine

## 2020-02-16 ENCOUNTER — Other Ambulatory Visit: Payer: Self-pay

## 2020-02-16 VITALS — BP 136/88 | HR 89 | Wt 219.6 lb

## 2020-02-16 DIAGNOSIS — E782 Mixed hyperlipidemia: Secondary | ICD-10-CM | POA: Diagnosis not present

## 2020-02-16 DIAGNOSIS — E1121 Type 2 diabetes mellitus with diabetic nephropathy: Secondary | ICD-10-CM

## 2020-02-16 DIAGNOSIS — M10371 Gout due to renal impairment, right ankle and foot: Secondary | ICD-10-CM

## 2020-02-16 DIAGNOSIS — N183 Chronic kidney disease, stage 3 unspecified: Secondary | ICD-10-CM | POA: Diagnosis not present

## 2020-02-16 DIAGNOSIS — I1310 Hypertensive heart and chronic kidney disease without heart failure, with stage 1 through stage 4 chronic kidney disease, or unspecified chronic kidney disease: Secondary | ICD-10-CM

## 2020-02-16 MED ORDER — ASPIRIN 81 MG PO TBEC: 81.0000 mg | DELAYED_RELEASE_TABLET | Freq: Every day | ORAL | 3 refills | Status: DC

## 2020-02-16 MED ORDER — METFORMIN HCL ER 500 MG PO TB24: 1000.0000 mg | ORAL_TABLET | Freq: Every day | ORAL | 1 refills | Status: DC

## 2020-02-16 MED ORDER — METOPROLOL TARTRATE 25 MG PO TABS: 25.0000 mg | ORAL_TABLET | Freq: Two times a day (BID) | ORAL | 1 refills | Status: DC

## 2020-02-16 MED ORDER — IRBESARTAN 300 MG PO TABS: 300.0000 mg | ORAL_TABLET | Freq: Every day | ORAL | 1 refills | Status: DC

## 2020-02-16 MED ORDER — HYDROCHLOROTHIAZIDE 25 MG PO TABS: 25.0000 mg | ORAL_TABLET | Freq: Every day | ORAL | 1 refills | Status: DC

## 2020-02-16 MED ORDER — HYDRALAZINE HCL 100 MG PO TABS: 100.0000 mg | ORAL_TABLET | Freq: Two times a day (BID) | ORAL | 1 refills | Status: DC

## 2020-02-16 MED ORDER — AMLODIPINE BESYLATE 10 MG PO TABS: 10.0000 mg | ORAL_TABLET | Freq: Every day | ORAL | 1 refills | Status: DC

## 2020-02-16 MED ORDER — ALLOPURINOL 100 MG PO TABS
100.0000 mg | ORAL_TABLET | Freq: Every day | ORAL | 1 refills | Status: DC
Start: 2020-02-16 — End: 2020-11-02

## 2020-02-16 MED ORDER — ATORVASTATIN CALCIUM 80 MG PO TABS: 80.0000 mg | ORAL_TABLET | Freq: Every day | ORAL | 1 refills | Status: DC

## 2020-02-16 NOTE — Progress Notes (Signed)
BP 136/88   Pulse 89   Wt 219 lb 9.6 oz (99.6 kg)   SpO2 96%   BMI 32.90 kg/m    Subjective:    Patient ID: Anthony Ouch., male    DOB: February 17, 1960, 60 y.o.   MRN: [id number]  HPI: Anthony Polczynski. is a 60 y.o. male  Chief Complaint  Patient presents with  . Diabetes   HYPERTENSION / HYPERLIPIDEMIA Satisfied with current treatment? yes Duration of hypertension: chronic BP monitoring frequency: not checking BP medication side effects: no Duration of hyperlipidemia: chronic Cholesterol medication side effects: no Cholesterol supplements: none Medication compliance: excellent compliance Aspirin: yes Recent stressors: no Recurrent headaches: no Visual changes: no Palpitations: no Dyspnea: no Chest pain: no Lower extremity edema: no Dizzy/lightheaded: no  No gout flares.  DIABETES Hypoglycemic episodes:no Polydipsia/polyuria: no Visual disturbance: no Chest pain: no Paresthesias: no Glucose Monitoring: no  Accucheck frequency: BID  Fasting glucose: 127-109 Taking Insulin?: no Blood Pressure Monitoring: not checking Retinal Examination: Not Up to Date Foot Exam: Up to Date Diabetic Education: Completed Pneumovax: Up to Date Influenza: Up to Date Aspirin: yes   Relevant past medical, surgical, family and social history reviewed and updated as indicated. Interim medical history since our last visit reviewed. Allergies and medications reviewed and updated.  Review of Systems  Constitutional: Negative.   Respiratory: Negative.   Cardiovascular: Negative.   Gastrointestinal: Negative.   Genitourinary: Negative.   Musculoskeletal: Negative.   Psychiatric/Behavioral: Negative.     Per HPI unless specifically indicated above     Objective:    BP 136/88   Pulse 89   Wt 219 lb 9.6 oz (99.6 kg)   SpO2 96%   BMI 32.90 kg/m   Wt Readings from Last 3 Encounters:  02/16/20 219 lb 9.6 oz (99.6 kg)  11/13/19 214 lb (97.1 kg)  08/14/19 (!) 215 lb  6.4 oz (97.7 kg)    Physical Exam Vitals and nursing note reviewed.  Constitutional:      General: He is not in acute distress.    Appearance: Normal appearance. He is not ill-appearing, toxic-appearing or diaphoretic.  HENT:     Head: Normocephalic and atraumatic.     Right Ear: External ear normal.     Left Ear: External ear normal.     Nose: Nose normal.     Mouth/Throat:     Mouth: Mucous membranes are moist.     Pharynx: Oropharynx is clear.  Eyes:     General: No scleral icterus.       Right eye: No discharge.        Left eye: No discharge.     Extraocular Movements: Extraocular movements intact.     Conjunctiva/sclera: Conjunctivae normal.     Pupils: Pupils are equal, round, and reactive to light.  Cardiovascular:     Rate and Rhythm: Normal rate and regular rhythm.     Pulses: Normal pulses.     Heart sounds: Normal heart sounds. No murmur heard. No friction rub. No gallop.   Pulmonary:     Effort: Pulmonary effort is normal. No respiratory distress.     Breath sounds: Normal breath sounds. No stridor. No wheezing, rhonchi or rales.  Chest:     Chest wall: No tenderness.  Musculoskeletal:        General: Normal range of motion.     Cervical back: Normal range of motion and neck supple.  Skin:    General: Skin is warm and  dry.     Capillary Refill: Capillary refill takes less than 2 seconds.     Coloration: Skin is not jaundiced or pale.     Findings: No bruising, erythema, lesion or rash.  Neurological:     General: No focal deficit present.     Mental Status: He is alert and oriented to person, place, and time. Mental status is at baseline.  Psychiatric:        Mood and Affect: Mood normal.        Behavior: Behavior normal.        Thought Content: Thought content normal.        Judgment: Judgment normal.     Results for orders placed or performed in visit on 11/13/19  Bayer DCA Hb A1c Waived  Result Value Ref Range   HB A1C (BAYER DCA - WAIVED) 5.5 <7.0  %      Assessment & Plan:   Problem List Items Addressed This Visit      Cardiovascular and Mediastinum   Hypertensive heart/kidney disease without HF and with CKD stage III ([place])    Under good control on current regimen. Continue current regimen. Continue to monitor. Call with any concerns. Refills given. Labs drawn today.       Relevant Medications   metoprolol tartrate (LOPRESSOR) 25 MG tablet   irbesartan (AVAPRO) 300 MG tablet   hydrochlorothiazide (HYDRODIURIL) 25 MG tablet   hydrALAZINE (APRESOLINE) 100 MG tablet   atorvastatin (LIPITOR) 80 MG tablet   aspirin (ASPIRIN LOW DOSE) 81 MG EC tablet   amLODipine (NORVASC) 10 MG tablet   Other Relevant Orders   CBC with Differential/Platelet   Comprehensive metabolic panel     Endocrine   Type 2 diabetes with nephropathy ([place]) - Primary    Doing well with A1c of 6.5. Continue current regimen. Continue to monitor. Call with any concerns.       Relevant Medications   metFORMIN (GLUCOPHAGE-XR) 500 MG 24 hr tablet   irbesartan (AVAPRO) 300 MG tablet   atorvastatin (LIPITOR) 80 MG tablet   aspirin (ASPIRIN LOW DOSE) 81 MG EC tablet   Other Relevant Orders   CBC with Differential/Platelet   Comprehensive metabolic panel   Bayer DCA Hb A1c Waived     Musculoskeletal and Integument   Acute gout due to renal impairment involving toe of right foot    Under good control on current regimen. Continue current regimen. Continue to monitor. Call with any concerns. Refills given. Labs drawn today.      Relevant Medications   aspirin (ASPIRIN LOW DOSE) 81 MG EC tablet   allopurinol (ZYLOPRIM) 100 MG tablet   Other Relevant Orders   CBC with Differential/Platelet   Comprehensive metabolic panel   Uric acid     Other   Hyperlipidemia    Under good control on current regimen. Continue current regimen. Continue to monitor. Call with any concerns. Refills given. Labs drawn today.      Relevant Medications   metoprolol tartrate  (LOPRESSOR) 25 MG tablet   irbesartan (AVAPRO) 300 MG tablet   hydrochlorothiazide (HYDRODIURIL) 25 MG tablet   hydrALAZINE (APRESOLINE) 100 MG tablet   atorvastatin (LIPITOR) 80 MG tablet   aspirin (ASPIRIN LOW DOSE) 81 MG EC tablet   amLODipine (NORVASC) 10 MG tablet   Other Relevant Orders   CBC with Differential/Platelet   Comprehensive metabolic panel   Lipid Panel w/o Chol/HDL Ratio       Follow up plan: Return in about 6  months (around 08/15/2020) for physical.

## 2020-02-16 NOTE — Assessment & Plan Note (Signed)
Under good control on current regimen. Continue current regimen. Continue to monitor. Call with any concerns. Refills given. Labs drawn today.   

## 2020-02-16 NOTE — Assessment & Plan Note (Signed)
Doing well with A1c of 6.5. Continue current regimen. Continue to monitor. Call with any concerns.

## 2020-02-17 ENCOUNTER — Other Ambulatory Visit: Payer: Self-pay | Admitting: Family Medicine

## 2020-02-17 DIAGNOSIS — N289 Disorder of kidney and ureter, unspecified: Secondary | ICD-10-CM

## 2020-02-17 LAB — CBC WITH DIFFERENTIAL/PLATELET
Basophils Absolute: 0 10*3/uL (ref 0.0–0.2)
Basos: 0 %
EOS (ABSOLUTE): 0.5 10*3/uL — ABNORMAL HIGH (ref 0.0–0.4)
Eos: 6 %
Hematocrit: 39.6 % (ref 37.5–51.0)
Hemoglobin: 13 g/dL (ref 13.0–17.7)
Immature Grans (Abs): 0 10*3/uL (ref 0.0–0.1)
Immature Granulocytes: 1 %
Lymphocytes Absolute: 1.5 10*3/uL (ref 0.7–3.1)
Lymphs: 17 %
MCH: 27.3 pg (ref 26.6–33.0)
MCHC: 32.8 g/dL (ref 31.5–35.7)
MCV: 83 fL (ref 79–97)
Monocytes Absolute: 0.8 10*3/uL (ref 0.1–0.9)
Monocytes: 10 %
Neutrophils Absolute: 5.5 10*3/uL (ref 1.4–7.0)
Neutrophils: 66 %
Platelets: 233 10*3/uL (ref 150–450)
RBC: 4.76 x10E6/uL (ref 4.14–5.80)
RDW: 13.9 % (ref 11.6–15.4)
WBC: 8.4 10*3/uL (ref 3.4–10.8)

## 2020-02-17 LAB — COMPREHENSIVE METABOLIC PANEL
ALT: 15 IU/L (ref 0–44)
AST: 16 IU/L (ref 0–40)
Albumin/Globulin Ratio: 2 (ref 1.2–2.2)
Albumin: 4.3 g/dL (ref 3.8–4.9)
Alkaline Phosphatase: 69 IU/L (ref 44–121)
BUN/Creatinine Ratio: 13 (ref 9–20)
BUN: 27 mg/dL — ABNORMAL HIGH (ref 6–24)
Bilirubin Total: 0.3 mg/dL (ref 0.0–1.2)
CO2: 20 mmol/L (ref 20–29)
Calcium: 9.1 mg/dL (ref 8.7–10.2)
Chloride: 105 mmol/L (ref 96–106)
Creatinine, Ser: 2.16 mg/dL — ABNORMAL HIGH (ref 0.76–1.27)
GFR calc Af Amer: 38 mL/min/{1.73_m2} — ABNORMAL LOW (ref >59)
GFR calc non Af Amer: 33 mL/min/{1.73_m2} — ABNORMAL LOW (ref >59)
Globulin, Total: 2.2 g/dL (ref 1.5–4.5)
Glucose: 110 mg/dL — ABNORMAL HIGH (ref 65–99)
Potassium: 4.2 mmol/L (ref 3.5–5.2)
Sodium: 143 mmol/L (ref 134–144)
Total Protein: 6.5 g/dL (ref 6.0–8.5)

## 2020-02-17 LAB — LIPID PANEL W/O CHOL/HDL RATIO
Cholesterol, Total: 157 mg/dL (ref 100–199)
HDL: 36 mg/dL — ABNORMAL LOW (ref >39)
LDL Chol Calc (NIH): 85 mg/dL (ref 0–99)
Triglycerides: 213 mg/dL — ABNORMAL HIGH (ref 0–149)
VLDL Cholesterol Cal: 36 mg/dL (ref 5–40)

## 2020-02-17 LAB — BAYER DCA HB A1C WAIVED: HB A1C (BAYER DCA - WAIVED): 6.5 % (ref <7.0)

## 2020-02-17 LAB — URIC ACID: Uric Acid: 8 mg/dL (ref 3.8–8.4)

## 2020-02-17 NOTE — Progress Notes (Signed)
Called patient, no answer, unable to leave a message, will try again.   

## 2020-03-25 ENCOUNTER — Telehealth: Payer: Self-pay

## 2020-03-25 NOTE — Telephone Encounter (Signed)
Called pt's wife on number provided to schedule virtual appt, no answer left vm   Copied from [place] [phone]. Topic: General - Other >> Mar 25, 2020  8:32 AM Anthony Sellers A wrote: Reason for CRM: Patient is beginning to experience difficulty with their allergies Patient's wife has made contact asking for suggestions of medications (otc) that patient can take Patient is on a combination of medications for diabetes and pt's wife is concerned with potential reaactions Please contact to advise at  [phone]

## 2020-03-26 ENCOUNTER — Encounter: Payer: Self-pay | Admitting: Family Medicine

## 2020-03-26 ENCOUNTER — Telehealth (INDEPENDENT_AMBULATORY_CARE_PROVIDER_SITE_OTHER): Payer: BC Managed Care – PPO | Admitting: Family Medicine

## 2020-03-26 DIAGNOSIS — J301 Allergic rhinitis due to pollen: Secondary | ICD-10-CM | POA: Diagnosis not present

## 2020-03-26 MED ORDER — SILDENAFIL CITRATE 100 MG PO TABS: 50.0000 mg | ORAL_TABLET | Freq: Every day | ORAL | 11 refills | Status: DC | PRN

## 2020-03-26 MED ORDER — LEVOCETIRIZINE DIHYDROCHLORIDE 5 MG PO TABS: 5.0000 mg | ORAL_TABLET | Freq: Every evening | ORAL | 3 refills | Status: DC

## 2020-03-26 NOTE — Progress Notes (Signed)
There were no vitals taken for this visit.   Subjective:    Patient ID: Anthony Sellers., male    DOB: 1961/01/19, 60 y.o.   MRN: [id number]  HPI: [name]. is a 60 y.o. male  Chief Complaint  Patient presents with  . Allergic Rhinitis     Patient states he is having runny nose, sneezing, watery eyes. Patient is wondering what allergy medications he is able to take with his hypertension  . Erectile Dysfunction    Patient states he never received his rx for E.D and would like to see if he could get a new rx for it .    UPPER RESPIRATORY TRACT INFECTION Duration: couple of days Worst symptom: runny nose Fever: no Cough: no Shortness of breath: no Wheezing: no Chest pain: no Chest tightness: no Chest congestion: yes Nasal congestion: yes Runny nose: yes Post nasal drip: yes Sneezing: yes Sore throat: no Swollen glands: no Sinus pressure: no Headache: no Face pain: no Toothache: no Ear pain: no  Ear pressure: no  Eyes red/itching:no Eye drainage/crusting: no  Vomiting: no Rash: no Fatigue: no Sick contacts: no Strep contacts: no  Context: worse Recurrent sinusitis: no Relief with OTC cold/cough medications: no  Treatments attempted: cold/sinus, mucinex and anti-histamine   Relevant past medical, surgical, family and social history reviewed and updated as indicated. Interim medical history since our last visit reviewed. Allergies and medications reviewed and updated.  Review of Systems  Constitutional: Negative.   HENT: Positive for congestion, postnasal drip and rhinorrhea. Negative for dental problem, drooling, ear discharge, ear pain, facial swelling, hearing loss, mouth sores, nosebleeds, sinus pressure, sinus pain, sneezing, sore throat, tinnitus, trouble swallowing and voice change.   Respiratory: Negative.   Cardiovascular: Negative.   Gastrointestinal: Negative.   Psychiatric/Behavioral: Negative.     Per HPI unless specifically  indicated above     Objective:    There were no vitals taken for this visit.  Wt Readings from Last 3 Encounters:  02/16/20 219 lb 9.6 oz (99.6 kg)  11/13/19 214 lb (97.1 kg)  08/14/19 (!) 215 lb 6.4 oz (97.7 kg)    Physical Exam Vitals and nursing note reviewed.  Pulmonary:     Effort: Pulmonary effort is normal. No respiratory distress.     Comments: Speaking in full sentences Neurological:     Mental Status: He is alert.  Psychiatric:        Mood and Affect: Mood normal.        Behavior: Behavior normal.        Thought Content: Thought content normal.        Judgment: Judgment normal.     Results for orders placed or performed in visit on 02/16/20  CBC with Differential/Platelet  Result Value Ref Range   WBC 8.4 3.4 - 10.8 x10E3/uL   RBC 4.76 4.14 - 5.80 x10E6/uL   Hemoglobin 13.0 13.0 - 17.7 g/dL   Hematocrit 39.6 37.5 - 51.0 %   MCV 83 79 - 97 fL   MCH 27.3 26.6 - 33.0 pg   MCHC 32.8 31.5 - 35.7 g/dL   RDW 13.9 11.6 - 15.4 %   Platelets 233 150 - 450 x10E3/uL   Neutrophils 66 Not Estab. %   Lymphs 17 Not Estab. %   Monocytes 10 Not Estab. %   Eos 6 Not Estab. %   Basos 0 Not Estab. %   Neutrophils Absolute 5.5 1.4 - 7.0 x10E3/uL   Lymphocytes  Absolute 1.5 0.7 - 3.1 x10E3/uL   Monocytes Absolute 0.8 0.1 - 0.9 x10E3/uL   EOS (ABSOLUTE) 0.5 (H) 0.0 - 0.4 x10E3/uL   Basophils Absolute 0.0 0.0 - 0.2 x10E3/uL   Immature Granulocytes 1 Not Estab. %   Immature Grans (Abs) 0.0 0.0 - 0.1 x10E3/uL  Comprehensive metabolic panel  Result Value Ref Range   Glucose 110 (H) 65 - 99 mg/dL   BUN 27 (H) 6 - 24 mg/dL   Creatinine, Ser 2.16 (H) 0.76 - 1.27 mg/dL   GFR calc non Af Amer 33 (L) >59 mL/min/1.73   GFR calc Af Amer 38 (L) >59 mL/min/1.73   BUN/Creatinine Ratio 13 9 - 20   Sodium 143 134 - 144 mmol/L   Potassium 4.2 3.5 - 5.2 mmol/L   Chloride 105 96 - 106 mmol/L   CO2 20 20 - 29 mmol/L   Calcium 9.1 8.7 - 10.2 mg/dL   Total Protein 6.5 6.0 - 8.5 g/dL    Albumin 4.3 3.8 - 4.9 g/dL   Globulin, Total 2.2 1.5 - 4.5 g/dL   Albumin/Globulin Ratio 2.0 1.2 - 2.2   Bilirubin Total 0.3 0.0 - 1.2 mg/dL   Alkaline Phosphatase 69 44 - 121 IU/L   AST 16 0 - 40 IU/L   ALT 15 0 - 44 IU/L  Lipid Panel w/o Chol/HDL Ratio  Result Value Ref Range   Cholesterol, Total 157 100 - 199 mg/dL   Triglycerides 213 (H) 0 - 149 mg/dL   HDL 36 (L) >39 mg/dL   VLDL Cholesterol Cal 36 5 - 40 mg/dL   LDL Chol Calc (NIH) 85 0 - 99 mg/dL  Uric acid  Result Value Ref Range   Uric Acid 8.0 3.8 - 8.4 mg/dL  Bayer DCA Hb A1c Waived  Result Value Ref Range   HB A1C (BAYER DCA - WAIVED) 6.5 <7.0 %      Assessment & Plan:   Problem List Items Addressed This Visit   None   Visit Diagnoses    Seasonal allergic rhinitis due to pollen    -  Primary   Will start him on xyzal. Discussed the use of coracitin HBP, which he will use if needed. Continue to monitor. Call with any concerns.        Follow up plan: Return if symptoms worsen or fail to improve.    . This visit was completed via telephone due to the restrictions of the COVID-19 pandemic. All issues as above were discussed and addressed but no physical exam was performed. If it was felt that the patient should be evaluated in the office, they were directed there. The patient verbally consented to this visit. Patient was unable to complete an audio/visual visit due to Lack of equipment. Due to the catastrophic nature of the COVID-19 pandemic, this visit was done through audio contact only. . Location of the patient: home . Location of the provider: work . Those involved with this call:  . Provider: Park Liter, DO . CMA: Louanna Raw, [place] . Front Desk/Registration: Jill Side  . Time spent on call: 15 minutes on the phone discussing health concerns. 23 minutes total spent in review of patient's record and preparation of their chart.

## 2020-05-01 ENCOUNTER — Other Ambulatory Visit: Payer: Self-pay | Admitting: Family Medicine

## 2020-05-01 NOTE — Telephone Encounter (Signed)
Requested Prescriptions  Pending Prescriptions Disp Refills  . metFORMIN (GLUCOPHAGE-XR) 500 MG 24 hr tablet [Pharmacy Med Name: METFORMIN HCL ER 500 MG TABLET] 360 tablet 0    Sig: TAKE 2 TABLETS (1,000 MG TOTAL) BY MOUTH 2 (TWO) TIMES DAILY WITH A MEAL.     Endocrinology:  Diabetes - Biguanides Failed - 05/01/2020 12:43 AM      Failed - Cr in normal range and within 360 days    Creatinine, Ser  Date Value Ref Range Status  02/16/2020 2.16 (H) 0.76 - 1.27 mg/dL Final         Failed - AA eGFR in normal range and within 360 days    GFR calc Af Amer  Date Value Ref Range Status  02/16/2020 38 (L) >59 mL/min/1.73 Final    Comment:    **In accordance with recommendations from the NKF-ASN Task force,**   [facility] is in the process of updating its eGFR calculation to the   2021 CKD-EPI creatinine equation that estimates kidney function   without a race variable.    GFR calc non Af Amer  Date Value Ref Range Status  02/16/2020 33 (L) >59 mL/min/1.73 Final         Passed - HBA1C is between 0 and 7.9 and within 180 days    HB A1C (BAYER DCA - WAIVED)  Date Value Ref Range Status  02/16/2020 6.5 <7.0 % Final    Comment:                                          Diabetic Adult            <7.0                                       Healthy Adult        4.3 - 5.7                                                           (DCCT/NGSP) American Diabetes Association's Summary of Glycemic Recommendations for Adults with Diabetes: Hemoglobin A1c <7.0%. More stringent glycemic goals (A1c <6.0%) may further reduce complications at the cost of increased risk of hypoglycemia.          Passed - Valid encounter within last 6 months    Recent Outpatient Visits          1 month ago Seasonal allergic rhinitis due to pollen   [facility], [name], DO   2 months ago Type 2 diabetes with nephropathy [facility])   [place], [name], DO   5 months ago Type 2  diabetes with nephropathy [facility])   [place], [name], DO   8 months ago Routine general medical examination at a health care facility   [facility], [place] P, DO   1 year ago Type 2 diabetes with nephropathy [facility])   [facility] Valerie Roys, DO      Future Appointments            In 3 months Park Liter  P, [place], [facility]

## 2020-07-13 ENCOUNTER — Other Ambulatory Visit: Payer: Self-pay | Admitting: Family Medicine

## 2020-07-21 ENCOUNTER — Other Ambulatory Visit: Payer: Self-pay | Admitting: Family Medicine

## 2020-07-24 ENCOUNTER — Other Ambulatory Visit: Payer: Self-pay | Admitting: Family Medicine

## 2020-07-24 NOTE — Telephone Encounter (Signed)
Requested Prescriptions  Pending Prescriptions Disp Refills  . hydrochlorothiazide (HYDRODIURIL) 25 MG tablet [Pharmacy Med Name: HYDROCHLOROTHIAZIDE 25 MG TAB] 90 tablet     Sig: TAKE 1 TABLET (25 MG TOTAL) BY MOUTH DAILY.     Cardiovascular: Diuretics - Thiazide Failed - 07/24/2020  2:10 PM      Failed - Cr in normal range and within 360 days    Creatinine, Ser  Date Value Ref Range Status  02/16/2020 2.16 (H) 0.76 - 1.27 mg/dL Final         Passed - Ca in normal range and within 360 days    Calcium  Date Value Ref Range Status  02/16/2020 9.1 8.7 - 10.2 mg/dL Final         Passed - K in normal range and within 360 days    Potassium  Date Value Ref Range Status  02/16/2020 4.2 3.5 - 5.2 mmol/L Final         Passed - Na in normal range and within 360 days    Sodium  Date Value Ref Range Status  02/16/2020 143 134 - 144 mmol/L Final         Passed - Last BP in normal range    BP Readings from Last 1 Encounters:  02/16/20 136/88         Passed - Valid encounter within last 6 months    Recent Outpatient Visits          4 months ago Seasonal allergic rhinitis due to pollen   [facility], [name], DO   5 months ago Type 2 diabetes with nephropathy [facility])   [place], [name], DO   8 months ago Type 2 diabetes with nephropathy ([place])   [place], [name], DO   11 months ago Routine general medical examination at a health care facility   [facility], [place], DO   1 year ago Type 2 diabetes with nephropathy [facility])   [place], [place], DO      Future Appointments            In 2 weeks [place], [name], DO [facility], [facility]           . amLODipine ([place]) 10 MG tablet [Pharmacy Med Name: AMLODIPINE BESYLATE 10 MG TAB] 90 tablet     Sig: TAKE 1 TABLET BY MOUTH EVERY DAY     Cardiovascular:  Calcium Channel Blockers Passed - 07/24/2020  2:10 PM       Passed - Last BP in normal range    BP Readings from Last 1 Encounters:  02/16/20 136/88         Passed - Valid encounter within last 6 months    Recent Outpatient Visits          4 months ago Seasonal allergic rhinitis due to pollen   [facility], [name], DO   5 months ago Type 2 diabetes with nephropathy [facility])   [place], [name], DO   8 months ago Type 2 diabetes with nephropathy [facility])   [place], [name], DO   11 months ago Routine general medical examination at a health care facility   [place], [place] P, DO   1 year ago Type 2 diabetes with nephropathy [facility])   [facility] Valerie Roys, DO      Future Appointments  In 2 weeks [place], [name], DO [facility], [facility]           . hydrALAZINE (APRESOLINE) 100 MG tablet [Pharmacy Med Name: HYDRALAZINE 100 MG TABLET] 180 tablet     Sig: TAKE 1 TABLET BY MOUTH TWICE A DAY     Cardiovascular:  Vasodilators Passed - 07/24/2020  2:10 PM      Passed - HCT in normal range and within 360 days    Hematocrit  Date Value Ref Range Status  02/16/2020 39.6 37.5 - 51.0 % Final         Passed - HGB in normal range and within 360 days    Hemoglobin  Date Value Ref Range Status  02/16/2020 13.0 13.0 - 17.7 g/dL Final         Passed - RBC in normal range and within 360 days    RBC  Date Value Ref Range Status  02/16/2020 4.76 4.14 - 5.80 x10E6/uL Final  02/15/2018 4.44 4.22 - 5.81 MIL/uL Final         Passed - WBC in normal range and within 360 days    WBC  Date Value Ref Range Status  02/16/2020 8.4 3.4 - 10.8 x10E3/uL Final  02/15/2018 6.0 4.0 - 10.5 K/uL Final         Passed - PLT in normal range and within 360 days    Platelets  Date Value Ref Range Status  02/16/2020 233 150 - 450 x10E3/uL Final         Passed - Last BP in normal range    BP Readings from Last 1 Encounters:   02/16/20 136/88         Passed - Valid encounter within last 12 months    Recent Outpatient Visits          4 months ago Seasonal allergic rhinitis due to pollen   [facility], [name], DO   5 months ago Type 2 diabetes with nephropathy [facility])   [place], [name], DO   8 months ago Type 2 diabetes with nephropathy [facility])   [place], [name], DO   11 months ago Routine general medical examination at a health care facility   [place], [place], DO   1 year ago Type 2 diabetes with nephropathy [facility])   [place], Barb Merino, DO      Future Appointments            In 2 weeks Wynetta Emery, Barb Merino, DO [place], [facility]           . irbesartan (AVAPRO) 300 MG tablet [Pharmacy Med Name: IRBESARTAN 300 MG TABLET] 90 tablet 0    Sig: TAKE 1 TABLET BY MOUTH EVERY DAY     Cardiovascular:  Angiotensin Receptor Blockers Failed - 07/24/2020  2:10 PM      Failed - Cr in normal range and within 180 days    Creatinine, Ser  Date Value Ref Range Status  02/16/2020 2.16 (H) 0.76 - 1.27 mg/dL Final         Passed - K in normal range and within 180 days    Potassium  Date Value Ref Range Status  02/16/2020 4.2 3.5 - 5.2 mmol/L Final         Passed - Patient is not pregnant      Passed - Last BP in normal range    BP Readings from Last  1 Encounters:  02/16/20 136/88         Passed - Valid encounter within last 6 months    Recent Outpatient Visits          4 months ago Seasonal allergic rhinitis due to pollen   [facility], [name], DO   5 months ago Type 2 diabetes with nephropathy [facility])   [place], [name], DO   8 months ago Type 2 diabetes with nephropathy [facility])   [place], [name], DO   11 months ago Routine general medical examination at a health care facility   [facility] [place] P, DO   1 year ago Type 2 diabetes with nephropathy [facility])   [facility] Valerie Roys, DO      Future Appointments            In 2 weeks Wynetta Emery, Barb Merino, DO [facility], [facility]           . atorvastatin (LIPITOR) 80 MG tablet [Pharmacy Med Name: ATORVASTATIN 80 MG TABLET] 90 tablet 0    Sig: TAKE 1 TABLET BY MOUTH DAILY AT 6 PM.     Cardiovascular:  Antilipid - Statins Failed - 07/24/2020  2:10 PM      Failed - HDL in normal range and within 360 days    HDL  Date Value Ref Range Status  02/16/2020 36 (L) >39 mg/dL Final         Failed - Triglycerides in normal range and within 360 days    Triglycerides  Date Value Ref Range Status  02/16/2020 213 (H) 0 - 149 mg/dL Final   Triglycerides Piccolo,Waived  Date Value Ref Range Status  11/19/2014 61 <150 mg/dL Final    Comment:                            Normal                   <150                         Borderline High     150 - 199                         High                200 - 499                         Very High                >499          Passed - Total Cholesterol in normal range and within 360 days    Cholesterol, Total  Date Value Ref Range Status  02/16/2020 157 100 - 199 mg/dL Final   Cholesterol Piccolo, Waived  Date Value Ref Range Status  11/19/2014 101 <200 mg/dL Final    Comment:                            Desirable                <200  Borderline High      200- 239                         High                     >239          Passed - LDL in normal range and within 360 days    LDL Chol Calc (NIH)  Date Value Ref Range Status  02/16/2020 85 0 - 99 mg/dL Final         Passed - Patient is not pregnant      Passed - Valid encounter within last 12 months    Recent Outpatient Visits          4 months ago Seasonal allergic rhinitis due to pollen   [facility], [name], DO   5 months ago Type 2 diabetes  with nephropathy [facility])   [place], [name], DO   8 months ago Type 2 diabetes with nephropathy [facility])   [place], [name], DO   11 months ago Routine general medical examination at a health care facility   [facility], [place], DO   1 year ago Type 2 diabetes with nephropathy [facility])   [facility] Valerie Roys, DO      Future Appointments            In 2 weeks Wynetta Emery, Barb Merino, DO [facility], [facility]

## 2020-07-26 ENCOUNTER — Other Ambulatory Visit: Payer: Self-pay | Admitting: Family Medicine

## 2020-07-28 ENCOUNTER — Other Ambulatory Visit: Payer: Self-pay | Admitting: Family Medicine

## 2020-08-13 ENCOUNTER — Ambulatory Visit: Payer: BC Managed Care – PPO | Admitting: Family Medicine

## 2020-09-16 ENCOUNTER — Other Ambulatory Visit: Payer: Self-pay | Admitting: Family Medicine

## 2020-09-16 NOTE — Telephone Encounter (Signed)
Notes to clinic:  Review for future refill Last filled on 09/06/2020 No follow up scheduled    Requested Prescriptions  Pending Prescriptions Disp Refills   irbesartan (AVAPRO) 300 MG tablet [Pharmacy Med Name: IRBESARTAN 300 MG TABLET] 90 tablet 0    Sig: TAKE 1 TABLET BY MOUTH EVERY DAY     Cardiovascular:  Angiotensin Receptor Blockers Failed - 09/16/2020  2:53 PM      Failed - Cr in normal range and within 180 days    Creatinine, Ser  Date Value Ref Range Status  02/16/2020 2.16 (H) 0.76 - 1.27 mg/dL Final          Failed - K in normal range and within 180 days    Potassium  Date Value Ref Range Status  02/16/2020 4.2 3.5 - 5.2 mmol/L Final          Passed - Patient is not pregnant      Passed - Last BP in normal range    BP Readings from Last 1 Encounters:  02/16/20 136/88          Passed - Valid encounter within last 6 months    Recent Outpatient Visits           5 months ago Seasonal allergic rhinitis due to pollen   [facility], [name], DO   7 months ago Type 2 diabetes with nephropathy ([place])   [place], [name], DO   10 months ago Type 2 diabetes with nephropathy ([place])   [place], [name], DO   1 year ago Routine general medical examination at a health care facility   [facility], [place], DO   1 year ago Type 2 diabetes with nephropathy ([place])   [facility], [name], DO               atorvastatin (LIPITOR) 80 MG tablet [Pharmacy Med Name: ATORVASTATIN 80 MG TABLET] 90 tablet 0    Sig: TAKE 1 TABLET BY MOUTH DAILY AT 6 PM.     Cardiovascular:  Antilipid - Statins Failed - 09/16/2020  2:53 PM      Failed - HDL in normal range and within 360 days    HDL  Date Value Ref Range Status  02/16/2020 36 (L) >39 mg/dL Final          Failed - Triglycerides in normal range and within 360 days    Triglycerides  Date Value Ref Range Status   02/16/2020 213 (H) 0 - 149 mg/dL Final   Triglycerides Piccolo,Waived  Date Value Ref Range Status  11/19/2014 61 <150 mg/dL Final    Comment:                            Normal                   <150                         Borderline High     150 - 199                         High                200 - 499  Very High                >499           Passed - Total Cholesterol in normal range and within 360 days    Cholesterol, Total  Date Value Ref Range Status  02/16/2020 157 100 - 199 mg/dL Final   Cholesterol Piccolo, Waived  Date Value Ref Range Status  11/19/2014 101 <200 mg/dL Final    Comment:                            Desirable                <200                         Borderline High      200- 239                         High                     >239           Passed - LDL in normal range and within 360 days    LDL Chol Calc (NIH)  Date Value Ref Range Status  02/16/2020 85 0 - 99 mg/dL Final          Passed - Patient is not pregnant      Passed - Valid encounter within last 12 months    Recent Outpatient Visits           5 months ago Seasonal allergic rhinitis due to pollen   [facility], [name], DO   7 months ago Type 2 diabetes with nephropathy [facility])   [place], [name], DO   10 months ago Type 2 diabetes with nephropathy [facility])   [place], [name], DO   1 year ago Routine general medical examination at a health care facility   [facility], [place], DO   1 year ago Type 2 diabetes with nephropathy [facility])   [place], [name], DO

## 2020-10-26 ENCOUNTER — Other Ambulatory Visit: Payer: Self-pay | Admitting: Family Medicine

## 2020-10-26 NOTE — Telephone Encounter (Signed)
Attempted to reach pt to schedule appt., no answer, unable to leave message.  30# courtesy refill given on HCTZ ,Irbesartan, amlodipine

## 2020-11-02 ENCOUNTER — Other Ambulatory Visit: Payer: Self-pay | Admitting: Family Medicine

## 2020-11-02 NOTE — Telephone Encounter (Signed)
Requested Prescriptions  Pending Prescriptions Disp Refills  . allopurinol (ZYLOPRIM) 100 MG tablet [Pharmacy Med Name: ALLOPURINOL 100 MG TABLET] 90 tablet 0    Sig: TAKE 1 TABLET BY MOUTH EVERY DAY     Endocrinology:  Gout Agents Failed - 11/02/2020 10:57 AM      Failed - Cr in normal range and within 360 days    Creatinine, Ser  Date Value Ref Range Status  02/16/2020 2.16 (H) 0.76 - 1.27 mg/dL Final         Passed - Uric Acid in normal range and within 360 days    Uric Acid  Date Value Ref Range Status  02/16/2020 8.0 3.8 - 8.4 mg/dL Final    Comment:               Therapeutic target for gout patients: <6.0         Passed - Valid encounter within last 12 months    Recent Outpatient Visits          7 months ago Seasonal allergic rhinitis due to pollen   [facility], [name], DO   8 months ago Type 2 diabetes with nephropathy [facility])   [place], [name], DO   11 months ago Type 2 diabetes with nephropathy [facility])   [place], [name], DO   1 year ago Routine general medical examination at a health care facility   [facility], [place], DO   1 year ago Type 2 diabetes with nephropathy [facility])   [place], [name], DO

## 2020-11-03 ENCOUNTER — Other Ambulatory Visit: Payer: Self-pay | Admitting: Family Medicine

## 2020-11-03 NOTE — Telephone Encounter (Signed)
Courtesy refill. Patient needs an office visit. Requested Prescriptions  Pending Prescriptions Disp Refills  . metFORMIN (GLUCOPHAGE-XR) 500 MG 24 hr tablet [Pharmacy Med Name: METFORMIN HCL ER 500 MG TABLET] 120 tablet 0    Sig: TAKE 2 TABLETS BY MOUTH TWICE A DAY WITH MEALS     Endocrinology:  Diabetes - Biguanides Failed - 11/03/2020 11:08 AM      Failed - Cr in normal range and within 360 days    Creatinine, Ser  Date Value Ref Range Status  02/16/2020 2.16 (H) 0.76 - 1.27 mg/dL Final         Failed - HBA1C is between 0 and 7.9 and within 180 days    HB A1C (BAYER DCA - WAIVED)  Date Value Ref Range Status  02/16/2020 6.5 <7.0 % Final    Comment:                                          Diabetic Adult            <7.0                                       Healthy Adult        4.3 - 5.7                                                           (DCCT/NGSP) American Diabetes Association's Summary of Glycemic Recommendations for Adults with Diabetes: Hemoglobin A1c <7.0%. More stringent glycemic goals (A1c <6.0%) may further reduce complications at the cost of increased risk of hypoglycemia.          Failed - AA eGFR in normal range and within 360 days    GFR calc Af Amer  Date Value Ref Range Status  02/16/2020 38 (L) >59 mL/min/1.73 Final    Comment:    **In accordance with recommendations from the NKF-ASN Task force,**   [facility] is in the process of updating its eGFR calculation to the   2021 CKD-EPI creatinine equation that estimates kidney function   without a race variable.    GFR calc non Af Amer  Date Value Ref Range Status  02/16/2020 33 (L) >59 mL/min/1.73 Final         Failed - Valid encounter within last 6 months    Recent Outpatient Visits          7 months ago Seasonal allergic rhinitis due to pollen   [facility], [name], DO   8 months ago Type 2 diabetes with nephropathy [facility])   [place], [name], DO   11  months ago Type 2 diabetes with nephropathy [facility])   [place], [name], DO   1 year ago Routine general medical examination at a health care facility   [facility], [place], DO   1 year ago Type 2 diabetes with nephropathy [facility])   [facility], [name], DO             . metoprolol tartrate (LOPRESSOR) 25 MG tablet [Pharmacy Med  Name: METOPROLOL TARTRATE 25 MG TAB] 60 tablet 0    Sig: TAKE 1 TABLET BY MOUTH TWICE A DAY     Cardiovascular:  Beta Blockers Failed - 11/03/2020 11:08 AM      Failed - Valid encounter within last 6 months    Recent Outpatient Visits          7 months ago Seasonal allergic rhinitis due to pollen   [facility], [name], DO   8 months ago Type 2 diabetes with nephropathy [facility])   [place], [name], DO   11 months ago Type 2 diabetes with nephropathy [facility])   [place], [name], DO   1 year ago Routine general medical examination at a health care facility   [facility], [place], DO   1 year ago Type 2 diabetes with nephropathy [facility])   [facility], [name], DO             Passed - Last BP in normal range    BP Readings from Last 1 Encounters:  02/16/20 136/88         Passed - Last Heart Rate in normal range    Pulse Readings from Last 1 Encounters:  02/16/20 89

## 2020-11-09 ENCOUNTER — Other Ambulatory Visit: Payer: Self-pay | Admitting: Family Medicine

## 2020-11-09 NOTE — Telephone Encounter (Signed)
Requested medication (s) are due for refill today: No  Requested medication (s) are on the active medication list: Yes  Last refill:  10/26/20  Future visit scheduled: No  Notes to clinic:  Attempted to reach pt. To make appointment. Cell no longer in service. No answer on home number. Pharmacy requesting 90 day supply.    Requested Prescriptions  Pending Prescriptions Disp Refills   irbesartan (AVAPRO) 300 MG tablet [Pharmacy Med Name: IRBESARTAN 300 MG TABLET] 90 tablet 1    Sig: TAKE 1 TABLET BY MOUTH EVERY DAY     Cardiovascular:  Angiotensin Receptor Blockers Failed - 11/09/2020  8:53 AM      Failed - Cr in normal range and within 180 days    Creatinine, Ser  Date Value Ref Range Status  02/16/2020 2.16 (H) 0.76 - 1.27 mg/dL Final          Failed - K in normal range and within 180 days    Potassium  Date Value Ref Range Status  02/16/2020 4.2 3.5 - 5.2 mmol/L Final          Failed - Valid encounter within last 6 months    Recent Outpatient Visits           7 months ago Seasonal allergic rhinitis due to pollen   [facility], [name], DO   8 months ago Type 2 diabetes with nephropathy [facility])   [place], [name], DO   12 months ago Type 2 diabetes with nephropathy [facility])   [place], [name], DO   1 year ago Routine general medical examination at a health care facility   [facility], [place], DO   1 year ago Type 2 diabetes with nephropathy [facility])   [place], [place], DO              Passed - Patient is not pregnant      Passed - Last BP in normal range    BP Readings from Last 1 Encounters:  02/16/20 136/88           hydrochlorothiazide (HYDRODIURIL) 25 MG tablet [Pharmacy Med Name: HYDROCHLOROTHIAZIDE 25 MG TAB] 90 tablet 1    Sig: TAKE 1 TABLET (25 MG TOTAL) BY MOUTH DAILY.     Cardiovascular: Diuretics - Thiazide Failed - 11/09/2020  8:53  AM      Failed - Cr in normal range and within 360 days    Creatinine, Ser  Date Value Ref Range Status  02/16/2020 2.16 (H) 0.76 - 1.27 mg/dL Final          Failed - Valid encounter within last 6 months    Recent Outpatient Visits           7 months ago Seasonal allergic rhinitis due to pollen   [facility], [name], DO   8 months ago Type 2 diabetes with nephropathy [facility])   [place], [name], DO   12 months ago Type 2 diabetes with nephropathy [facility])   [place], [name], DO   1 year ago Routine general medical examination at a health care facility   [facility], [place], DO   1 year ago Type 2 diabetes with nephropathy [facility])   [place], [name], DO              Passed - Ca in normal range  and within 360 days    Calcium  Date Value Ref Range Status  02/16/2020 9.1 8.7 - 10.2 mg/dL Final          Passed - K in normal range and within 360 days    Potassium  Date Value Ref Range Status  02/16/2020 4.2 3.5 - 5.2 mmol/L Final          Passed - Na in normal range and within 360 days    Sodium  Date Value Ref Range Status  02/16/2020 143 134 - 144 mmol/L Final          Passed - Last BP in normal range    BP Readings from Last 1 Encounters:  02/16/20 136/88           amLODipine (NORVASC) 10 MG tablet [Pharmacy Med Name: AMLODIPINE BESYLATE 10 MG TAB] 90 tablet 1    Sig: TAKE 1 TABLET BY MOUTH EVERY DAY     Cardiovascular:  Calcium Channel Blockers Failed - 11/09/2020  8:53 AM      Failed - Valid encounter within last 6 months    Recent Outpatient Visits           7 months ago Seasonal allergic rhinitis due to pollen   [facility], [name], DO   8 months ago Type 2 diabetes with nephropathy [facility])   [place], [name], DO   12 months ago Type 2 diabetes with nephropathy [facility])   [place], [name], DO   1 year ago Routine general medical examination at a health care facility   [facility], [place], DO   1 year ago Type 2 diabetes with nephropathy [facility])   [facility], [name], DO              Passed - Last BP in normal range    BP Readings from Last 1 Encounters:  02/16/20 136/88

## 2020-11-16 ENCOUNTER — Other Ambulatory Visit: Payer: Self-pay | Admitting: Family Medicine

## 2020-11-16 NOTE — Telephone Encounter (Signed)
Request for 90 day Rx- this is courtesy RF- needs appointment Requested Prescriptions  Pending Prescriptions Disp Refills  . metFORMIN (GLUCOPHAGE-XR) 500 MG 24 hr tablet [Pharmacy Med Name: METFORMIN HCL ER 500 MG TABLET] 360 tablet 1    Sig: TAKE 2 TABLETS BY MOUTH TWICE A DAY WITH MEALS     Endocrinology:  Diabetes - Biguanides Failed - 11/16/2020  8:30 AM      Failed - Cr in normal range and within 360 days    Creatinine, Ser  Date Value Ref Range Status  02/16/2020 2.16 (H) 0.76 - 1.27 mg/dL Final         Failed - HBA1C is between 0 and 7.9 and within 180 days    HB A1C (BAYER DCA - WAIVED)  Date Value Ref Range Status  02/16/2020 6.5 <7.0 % Final    Comment:                                          Diabetic Adult            <7.0                                       Healthy Adult        4.3 - 5.7                                                           (DCCT/NGSP) American Diabetes Association's Summary of Glycemic Recommendations for Adults with Diabetes: Hemoglobin A1c <7.0%. More stringent glycemic goals (A1c <6.0%) may further reduce complications at the cost of increased risk of hypoglycemia.          Failed - AA eGFR in normal range and within 360 days    GFR calc Af Amer  Date Value Ref Range Status  02/16/2020 38 (L) >59 mL/min/1.73 Final    Comment:    **In accordance with recommendations from the NKF-ASN Task force,**   [facility] is in the process of updating its eGFR calculation to the   2021 CKD-EPI creatinine equation that estimates kidney function   without a race variable.    GFR calc non Af Amer  Date Value Ref Range Status  02/16/2020 33 (L) >59 mL/min/1.73 Final         Failed - Valid encounter within last 6 months    Recent Outpatient Visits          7 months ago Seasonal allergic rhinitis due to pollen   [facility], [name], DO   9 months ago Type 2 diabetes with nephropathy [facility])   [place],  [name], DO   1 year ago Type 2 diabetes with nephropathy [facility])   [place], [name], DO   1 year ago Routine general medical examination at a health care facility   [facility], [place], DO   1 year ago Type 2 diabetes with nephropathy [facility])   [place], [name], DO

## 2020-11-17 ENCOUNTER — Other Ambulatory Visit: Payer: Self-pay | Admitting: Family Medicine

## 2020-12-02 ENCOUNTER — Other Ambulatory Visit: Payer: Self-pay | Admitting: Family Medicine

## 2020-12-02 NOTE — Telephone Encounter (Signed)
Requested medications are due for refill today NO  Requested medications are on the active medication list Yes  Last refill 11/24/20 Lopressor, 11/17/20 ASA  Last visit 03/2020  Future visit scheduled no, NO SHOW 07/2020  Notes to clinic requesting too soon. Please assess.  Requested Prescriptions  Pending Prescriptions Disp Refills   metoprolol tartrate (LOPRESSOR) 25 MG tablet [Pharmacy Med Name: METOPROLOL TARTRATE 25 MG TAB] 60 tablet 0    Sig: TAKE 1 TABLET BY MOUTH TWICE A DAY     Cardiovascular:  Beta Blockers Failed - 12/02/2020  1:36 PM      Failed - Valid encounter within last 6 months    Recent Outpatient Visits           8 months ago Seasonal allergic rhinitis due to pollen   [facility], [name], DO   9 months ago Type 2 diabetes with nephropathy [facility])   [place], [name], DO   1 year ago Type 2 diabetes with nephropathy ([place])   [place], [name], DO   1 year ago Routine general medical examination at a health care facility   [facility], [place], DO   1 year ago Type 2 diabetes with nephropathy [facility])   [facility], [name], DO              Passed - Last BP in normal range    BP Readings from Last 1 Encounters:  02/16/20 136/88          Passed - Last Heart Rate in normal range    Pulse Readings from Last 1 Encounters:  02/16/20 89           ASPIRIN LOW DOSE 81 MG EC tablet [Pharmacy Med Name: ASPIRIN EC 81 MG TABLET] 120 tablet 3    Sig: TAKE 1 TABLET (81 MG TOTAL) BY MOUTH DAILY. SWALLOW WHOLE.     Analgesics:  NSAIDS - aspirin Passed - 12/02/2020  1:36 PM      Passed - Patient is not pregnant      Passed - Valid encounter within last 12 months    Recent Outpatient Visits           8 months ago Seasonal allergic rhinitis due to pollen   [facility], [name], DO   9 months ago Type 2 diabetes with nephropathy  [facility])   [place], [name], DO   1 year ago Type 2 diabetes with nephropathy [facility])   [place], [name], DO   1 year ago Routine general medical examination at a health care facility   [facility], [place], DO   1 year ago Type 2 diabetes with nephropathy [facility])   [place], [name], DO

## 2020-12-03 NOTE — Telephone Encounter (Signed)
Needs appt

## 2020-12-06 NOTE — Telephone Encounter (Signed)
Called pt to schedule ov for medication refill, unable to leave vm.

## 2020-12-28 ENCOUNTER — Other Ambulatory Visit: Payer: Self-pay

## 2020-12-28 ENCOUNTER — Encounter: Payer: Self-pay | Admitting: Family Medicine

## 2020-12-28 ENCOUNTER — Ambulatory Visit (INDEPENDENT_AMBULATORY_CARE_PROVIDER_SITE_OTHER): Payer: Self-pay | Admitting: Family Medicine

## 2020-12-28 VITALS — BP 136/80 | HR 79 | Temp 97.9°F

## 2020-12-28 DIAGNOSIS — I1310 Hypertensive heart and chronic kidney disease without heart failure, with stage 1 through stage 4 chronic kidney disease, or unspecified chronic kidney disease: Secondary | ICD-10-CM

## 2020-12-28 DIAGNOSIS — M10371 Gout due to renal impairment, right ankle and foot: Secondary | ICD-10-CM

## 2020-12-28 DIAGNOSIS — E782 Mixed hyperlipidemia: Secondary | ICD-10-CM

## 2020-12-28 DIAGNOSIS — E1121 Type 2 diabetes mellitus with diabetic nephropathy: Secondary | ICD-10-CM

## 2020-12-28 DIAGNOSIS — N183 Chronic kidney disease, stage 3 unspecified: Secondary | ICD-10-CM

## 2020-12-28 LAB — MICROALBUMIN, URINE WAIVED
Creatinine, Urine Waived: 200 mg/dL (ref 10–300)
Microalb, Ur Waived: 150 mg/L — ABNORMAL HIGH (ref 0–19)
Microalb/Creat Ratio: >300 mg/g — ABNORMAL HIGH (ref <30)

## 2020-12-28 LAB — BAYER DCA HB A1C WAIVED: HB A1C (BAYER DCA - WAIVED): 7 % — ABNORMAL HIGH (ref 4.8–5.6)

## 2020-12-28 MED ORDER — EMPAGLIFLOZIN 25 MG PO TABS: 25.0000 mg | ORAL_TABLET | Freq: Every day | ORAL | 3 refills | Status: DC

## 2020-12-28 NOTE — Progress Notes (Signed)
BP 136/80   Pulse 79   Temp 97.9 F (36.6 C)   SpO2 95%    Subjective:    Patient ID: Anthony Ouch., male    DOB: 27-Oct-1960, 60 y.o.   MRN: [id number]  HPI: Anthony Ducksworth. is a 60 y.o. male  Chief Complaint  Patient presents with   Diabetes    Patient here to follow up, patient has not had eye exam this year   DIABETES Hypoglycemic episodes:no Polydipsia/polyuria: no Visual disturbance: no Chest pain: no Paresthesias: no Glucose Monitoring: no  Accucheck frequency: Not Checking Taking Insulin?: no Blood Pressure Monitoring: not checking Retinal Examination: Not up to Date Foot Exam: Up to Date Diabetic Education: Completed Pneumovax: Up to Date Influenza:  declined Aspirin: no  HYPERTENSION / Aline Satisfied with current treatment? yes Duration of hypertension: chronic BP medication side effects: no Duration of hyperlipidemia: chronic Cholesterol medication side effects: no Cholesterol supplements: none Past cholesterol medications: allopurinol Medication compliance: excellent compliance Aspirin: yes Recent stressors: no Recurrent headaches: no Visual changes: no Palpitations: no Dyspnea: no Chest pain: no Lower extremity edema: no Dizzy/lightheaded: no  No gout flares. Feeling well. Tolerating allopurinol well.   Relevant past medical, surgical, family and social history reviewed and updated as indicated. Interim medical history since our last visit reviewed. Allergies and medications reviewed and updated.  Review of Systems  Constitutional: Negative.   Respiratory: Negative.    Cardiovascular: Negative.   Gastrointestinal: Negative.   Musculoskeletal: Negative.   Neurological: Negative.   Psychiatric/Behavioral: Negative.     Per HPI unless specifically indicated above     Objective:    BP 136/80   Pulse 79   Temp 97.9 F (36.6 C)   SpO2 95%   Wt Readings from Last 3 Encounters:  02/16/20 219 lb 9.6 oz (99.6 kg)   11/13/19 214 lb (97.1 kg)  08/14/19 (!) 215 lb 6.4 oz (97.7 kg)    Physical Exam Vitals and nursing note reviewed.  Constitutional:      General: He is not in acute distress.    Appearance: Normal appearance. He is not ill-appearing, toxic-appearing or diaphoretic.  HENT:     Head: Normocephalic and atraumatic.     Right Ear: External ear normal.     Left Ear: External ear normal.     Nose: Nose normal.     Mouth/Throat:     Mouth: Mucous membranes are moist.     Pharynx: Oropharynx is clear.  Eyes:     General: No scleral icterus.       Right eye: No discharge.        Left eye: No discharge.     Extraocular Movements: Extraocular movements intact.     Conjunctiva/sclera: Conjunctivae normal.     Pupils: Pupils are equal, round, and reactive to light.  Cardiovascular:     Rate and Rhythm: Normal rate and regular rhythm.     Pulses: Normal pulses.     Heart sounds: Normal heart sounds. No murmur heard.   No friction rub. No gallop.  Pulmonary:     Effort: Pulmonary effort is normal. No respiratory distress.     Breath sounds: Normal breath sounds. No stridor. No wheezing, rhonchi or rales.  Chest:     Chest wall: No tenderness.  Musculoskeletal:        General: Normal range of motion.     Cervical back: Normal range of motion and neck supple.  Skin:    General:  Skin is warm and dry.     Capillary Refill: Capillary refill takes less than 2 seconds.     Coloration: Skin is not jaundiced or pale.     Findings: No bruising, erythema, lesion or rash.  Neurological:     General: No focal deficit present.     Mental Status: He is alert and oriented to person, place, and time. Mental status is at baseline.  Psychiatric:        Mood and Affect: Mood normal.        Behavior: Behavior normal.        Thought Content: Thought content normal.        Judgment: Judgment normal.    Results for orders placed or performed in visit on 02/16/20  CBC with Differential/Platelet  Result  Value Ref Range   WBC 8.4 3.4 - 10.8 x10E3/uL   RBC 4.76 4.14 - 5.80 x10E6/uL   Hemoglobin 13.0 13.0 - 17.7 g/dL   Hematocrit 39.6 37.5 - 51.0 %   MCV 83 79 - 97 fL   MCH 27.3 26.6 - 33.0 pg   MCHC 32.8 31.5 - 35.7 g/dL   RDW 13.9 11.6 - 15.4 %   Platelets 233 150 - 450 x10E3/uL   Neutrophils 66 Not Estab. %   Lymphs 17 Not Estab. %   Monocytes 10 Not Estab. %   Eos 6 Not Estab. %   Basos 0 Not Estab. %   Neutrophils Absolute 5.5 1.4 - 7.0 x10E3/uL   Lymphocytes Absolute 1.5 0.7 - 3.1 x10E3/uL   Monocytes Absolute 0.8 0.1 - 0.9 x10E3/uL   EOS (ABSOLUTE) 0.5 (H) 0.0 - 0.4 x10E3/uL   Basophils Absolute 0.0 0.0 - 0.2 x10E3/uL   Immature Granulocytes 1 Not Estab. %   Immature Grans (Abs) 0.0 0.0 - 0.1 x10E3/uL  Comprehensive metabolic panel  Result Value Ref Range   Glucose 110 (H) 65 - 99 mg/dL   BUN 27 (H) 6 - 24 mg/dL   Creatinine, Ser 2.16 (H) 0.76 - 1.27 mg/dL   GFR calc non Af Amer 33 (L) >59 mL/min/1.73   GFR calc Af Amer 38 (L) >59 mL/min/1.73   BUN/Creatinine Ratio 13 9 - 20   Sodium 143 134 - 144 mmol/L   Potassium 4.2 3.5 - 5.2 mmol/L   Chloride 105 96 - 106 mmol/L   CO2 20 20 - 29 mmol/L   Calcium 9.1 8.7 - 10.2 mg/dL   Total Protein 6.5 6.0 - 8.5 g/dL   Albumin 4.3 3.8 - 4.9 g/dL   Globulin, Total 2.2 1.5 - 4.5 g/dL   Albumin/Globulin Ratio 2.0 1.2 - 2.2   Bilirubin Total 0.3 0.0 - 1.2 mg/dL   Alkaline Phosphatase 69 44 - 121 IU/L   AST 16 0 - 40 IU/L   ALT 15 0 - 44 IU/L  Lipid Panel w/o Chol/HDL Ratio  Result Value Ref Range   Cholesterol, Total 157 100 - 199 mg/dL   Triglycerides 213 (H) 0 - 149 mg/dL   HDL 36 (L) >39 mg/dL   VLDL Cholesterol Cal 36 5 - 40 mg/dL   LDL Chol Calc (NIH) 85 0 - 99 mg/dL  Uric acid  Result Value Ref Range   Uric Acid 8.0 3.8 - 8.4 mg/dL  Bayer DCA Hb A1c Waived  Result Value Ref Range   HB A1C (BAYER DCA - WAIVED) 6.5 <7.0 %      Assessment & Plan:   Problem List Items Addressed This Visit  Cardiovascular and  Mediastinum   Hypertensive heart/kidney disease without HF and with CKD stage III ([place])    Under good control on current regimen. Continue current regimen. Continue to monitor. Call with any concerns. Refills given. Labs drawn today.       Relevant Orders   CBC with Differential/Platelet   Comprehensive metabolic panel     Endocrine   Type 2 diabetes with nephropathy ([place]) - Primary    Up slightly with a1c of 7.0- will add jardiance and recheck 3 months. Continue metformin.       Relevant Medications   empagliflozin (JARDIANCE) 25 MG TABS tablet   Other Relevant Orders   Bayer DCA Hb A1c Waived   CBC with Differential/Platelet   Comprehensive metabolic panel   [facility]     Musculoskeletal and Integument   Acute gout due to renal impairment involving toe of right foot    Under good control on current regimen. Continue current regimen. Continue to monitor. Call with any concerns. Refills given. Labs drawn today.       Relevant Orders   CBC with Differential/Platelet   Comprehensive metabolic panel   Uric acid     Genitourinary   Stage 3 chronic kidney disease    Labs drawn today. Await results. Treat as needed.       Relevant Orders   CBC with Differential/Platelet   Comprehensive metabolic panel     Other   Hyperlipidemia    Under good control on current regimen. Continue current regimen. Continue to monitor. Call with any concerns. Refills given. Labs drawn today.       Relevant Orders   CBC with Differential/Platelet   Comprehensive metabolic panel   Lipid Panel w/o Chol/HDL Ratio   Microalbumin, Urine Waived     Follow up plan: Return in about 3 months (around 03/28/2021).

## 2020-12-28 NOTE — Assessment & Plan Note (Signed)
Under good control on current regimen. Continue current regimen. Continue to monitor. Call with any concerns. Refills given. Labs drawn today.   

## 2020-12-28 NOTE — Patient Instructions (Signed)
Type 2 Diabetes Medication  Jardiance (empagliflozin) tablets   [email or web address]

## 2020-12-28 NOTE — Assessment & Plan Note (Signed)
Up slightly with a1c of 7.0- will add jardiance and recheck 3 months. Continue metformin.

## 2020-12-28 NOTE — Assessment & Plan Note (Signed)
Labs drawn today. Await results. Treat as needed.  

## 2020-12-29 ENCOUNTER — Telehealth: Payer: Self-pay | Admitting: Family Medicine

## 2020-12-29 LAB — COMPREHENSIVE METABOLIC PANEL
ALT: 21 IU/L (ref 0–44)
AST: 23 IU/L (ref 0–40)
Albumin/Globulin Ratio: 2 (ref 1.2–2.2)
Albumin: 4.6 g/dL (ref 3.8–4.9)
Alkaline Phosphatase: 84 IU/L (ref 44–121)
BUN/Creatinine Ratio: 13 (ref 9–20)
BUN: 22 mg/dL (ref 6–24)
Bilirubin Total: 0.4 mg/dL (ref 0.0–1.2)
CO2: 23 mmol/L (ref 20–29)
Calcium: 9.2 mg/dL (ref 8.7–10.2)
Chloride: 102 mmol/L (ref 96–106)
Creatinine, Ser: 1.7 mg/dL — ABNORMAL HIGH (ref 0.76–1.27)
Globulin, Total: 2.3 g/dL (ref 1.5–4.5)
Glucose: 118 mg/dL — ABNORMAL HIGH (ref 70–99)
Potassium: 4.1 mmol/L (ref 3.5–5.2)
Sodium: 140 mmol/L (ref 134–144)
Total Protein: 6.9 g/dL (ref 6.0–8.5)
eGFR: 46 mL/min/{1.73_m2} — ABNORMAL LOW (ref >59)

## 2020-12-29 LAB — LIPID PANEL W/O CHOL/HDL RATIO
Cholesterol, Total: 172 mg/dL (ref 100–199)
HDL: 37 mg/dL — ABNORMAL LOW (ref >39)
LDL Chol Calc (NIH): 91 mg/dL (ref 0–99)
Triglycerides: 262 mg/dL — ABNORMAL HIGH (ref 0–149)
VLDL Cholesterol Cal: 44 mg/dL — ABNORMAL HIGH (ref 5–40)

## 2020-12-29 LAB — CBC WITH DIFFERENTIAL/PLATELET
Basophils Absolute: 0.1 10*3/uL (ref 0.0–0.2)
Basos: 1 %
EOS (ABSOLUTE): 0.4 10*3/uL (ref 0.0–0.4)
Eos: 6 %
Hematocrit: 43.2 % (ref 37.5–51.0)
Hemoglobin: 14.2 g/dL (ref 13.0–17.7)
Immature Grans (Abs): 0 10*3/uL (ref 0.0–0.1)
Immature Granulocytes: 0 %
Lymphocytes Absolute: 1.3 10*3/uL (ref 0.7–3.1)
Lymphs: 19 %
MCH: 27.1 pg (ref 26.6–33.0)
MCHC: 32.9 g/dL (ref 31.5–35.7)
MCV: 82 fL (ref 79–97)
Monocytes Absolute: 0.6 10*3/uL (ref 0.1–0.9)
Monocytes: 9 %
Neutrophils Absolute: 4.6 10*3/uL (ref 1.4–7.0)
Neutrophils: 65 %
Platelets: 265 10*3/uL (ref 150–450)
RBC: 5.24 x10E6/uL (ref 4.14–5.80)
RDW: 13.8 % (ref 11.6–15.4)
WBC: 7 10*3/uL (ref 3.4–10.8)

## 2020-12-29 LAB — URIC ACID: Uric Acid: 8.3 mg/dL (ref 3.8–8.4)

## 2020-12-29 NOTE — Telephone Encounter (Signed)
Copied from [place] [phone]. Topic: General - Other >> Dec 29, 2020  3:13 PM Loma [place] wrote: empagliflozin (JARDIANCE) 25 MG TABS tablet 30 tablet 3 12/28/2020   Sig - Route: Take 1 tablet (25 mg total) by mouth daily before breakfast. - Oral  Sent to pharmacy as: empagliflozin (JARDIANCE) 25 MG Tab tablet  E-Prescribing Status: Receipt confirmed by pharmacy (12/28/2020 11:40 AM EST   Pt went to pick up this med and was $525.00. Pt has lost ins and job, needs alternative, pharmacy states no generic.  Pt also said that visit yesterday Dr Lenna Sciara was going to refill all meds and states were not at  [id number] - [place], [address] [place] [address] Phone: [phone] Fax: [phone] Hours: Not open 24 hours FU pls at [phone]

## 2020-12-30 ENCOUNTER — Other Ambulatory Visit: Payer: Self-pay | Admitting: Family Medicine

## 2020-12-30 MED ORDER — AMLODIPINE BESYLATE 10 MG PO TABS: 10.0000 mg | ORAL_TABLET | Freq: Every day | ORAL | 1 refills | Status: DC

## 2020-12-30 MED ORDER — IRBESARTAN 300 MG PO TABS: 300.0000 mg | ORAL_TABLET | Freq: Every day | ORAL | 1 refills | Status: DC

## 2020-12-30 MED ORDER — HYDROCHLOROTHIAZIDE 25 MG PO TABS: 25.0000 mg | ORAL_TABLET | Freq: Every day | ORAL | 1 refills | Status: DC

## 2020-12-30 MED ORDER — HYDRALAZINE HCL 100 MG PO TABS: 100.0000 mg | ORAL_TABLET | Freq: Two times a day (BID) | ORAL | 1 refills | Status: DC

## 2020-12-30 MED ORDER — LEVOCETIRIZINE DIHYDROCHLORIDE 5 MG PO TABS: 5.0000 mg | ORAL_TABLET | Freq: Every evening | ORAL | 3 refills | Status: DC

## 2020-12-30 MED ORDER — SILDENAFIL CITRATE 100 MG PO TABS
50.0000 mg | ORAL_TABLET | Freq: Every day | ORAL | 11 refills | Status: DC | PRN
Start: 2020-12-30 — End: 2022-01-05

## 2020-12-30 MED ORDER — METOPROLOL TARTRATE 25 MG PO TABS: 25.0000 mg | ORAL_TABLET | Freq: Two times a day (BID) | ORAL | 1 refills | Status: DC

## 2020-12-30 MED ORDER — ATORVASTATIN CALCIUM 80 MG PO TABS: ORAL_TABLET | ORAL | 1 refills | Status: DC

## 2020-12-30 MED ORDER — METFORMIN HCL ER 500 MG PO TB24: 1000.0000 mg | ORAL_TABLET | Freq: Two times a day (BID) | ORAL | 1 refills | Status: DC

## 2020-12-30 NOTE — Telephone Encounter (Signed)
I discussed the coupon with him and have a referral in for pharmacy to call him.

## 2020-12-30 NOTE — Telephone Encounter (Signed)
Attempted to contact patient, no answer unable to leave message. Will attempted to call again.

## 2021-01-05 NOTE — Telephone Encounter (Signed)
Attempted to contact patient, no answer unable to leave LVM.

## 2021-02-07 NOTE — Telephone Encounter (Signed)
Has this encounter been resolved?

## 2021-03-08 ENCOUNTER — Other Ambulatory Visit: Payer: Self-pay | Admitting: Family Medicine

## 2021-03-08 NOTE — Telephone Encounter (Signed)
Requested Prescriptions  Pending Prescriptions Disp Refills   allopurinol (ZYLOPRIM) 100 MG tablet [Pharmacy Med Name: ALLOPURINOL 100 MG TABLET] 90 tablet 0    Sig: TAKE 1 TABLET BY [place] DAY     Endocrinology:  Gout Agents - allopurinol Failed - 03/08/2021  1:50 AM      Failed - Cr in normal range and within 360 days    Creatinine, Ser  Date Value Ref Range Status  12/28/2020 1.70 (H) 0.76 - 1.27 mg/dL Final         Passed - Uric Acid in normal range and within 360 days    Uric Acid  Date Value Ref Range Status  12/28/2020 8.3 3.8 - 8.4 mg/dL Final    Comment:               Therapeutic target for gout patients: <6.0         Passed - Valid encounter within last 12 months    Recent Outpatient Visits          2 months ago Type 2 diabetes with nephropathy ([place])   [facility] [place], [name], DO   11 months ago Seasonal allergic rhinitis due to pollen   [facility], [name], DO   1 year ago Type 2 diabetes with nephropathy ([place])   [place], [name], DO   1 year ago Type 2 diabetes with nephropathy ([place])   [place], [name], DO   1 year ago Routine general medical examination at a health care facility   [place], [place], DO      Future Appointments            In 2 weeks Wynetta Emery, [name], DO [place], [facility]           Passed - CBC within normal limits and completed in the last 12 months    WBC  Date Value Ref Range Status  12/28/2020 7.0 3.4 - 10.8 x10E3/uL Final  02/15/2018 6.0 4.0 - 10.5 K/uL Final   RBC  Date Value Ref Range Status  12/28/2020 5.24 4.14 - 5.80 x10E6/uL Final  02/15/2018 4.44 4.22 - 5.81 MIL/uL Final   Hemoglobin  Date Value Ref Range Status  12/28/2020 14.2 13.0 - 17.7 g/dL Final   Hematocrit  Date Value Ref Range Status  12/28/2020 43.2 37.5 - 51.0 % Final   MCHC  Date Value Ref Range Status  12/28/2020 32.9 31.5  - 35.7 g/dL Final  02/15/2018 32.6 30.0 - 36.0 g/dL Final   [facility]  Date Value Ref Range Status  12/28/2020 27.1 26.6 - 33.0 pg Final  02/15/2018 27.3 26.0 - 34.0 pg Final   MCV  Date Value Ref Range Status  12/28/2020 82 79 - 97 fL Final   No results found for: PLTCOUNTKUC, LABPLAT, POCPLA RDW  Date Value Ref Range Status  12/28/2020 13.8 11.6 - 15.4 % Final

## 2021-03-13 ENCOUNTER — Other Ambulatory Visit: Payer: Self-pay | Admitting: Family Medicine

## 2021-03-14 ENCOUNTER — Other Ambulatory Visit: Payer: Self-pay | Admitting: Family Medicine

## 2021-03-14 NOTE — Telephone Encounter (Signed)
Requested by interface surescripts. Last refill 03/12/21. Requested Prescriptions  Refused Prescriptions Disp Refills   metoprolol tartrate (LOPRESSOR) 25 MG tablet [Pharmacy Med Name: METOPROLOL TARTRATE 25 MG TAB] 180 tablet 1    Sig: TAKE 1 TABLET BY MOUTH TWICE A DAY     Cardiovascular:  Beta Blockers Passed - 03/13/2021  8:46 AM      Passed - Last BP in normal range    BP Readings from Last 1 Encounters:  12/28/20 136/80         Passed - Last Heart Rate in normal range    Pulse Readings from Last 1 Encounters:  12/28/20 79         Passed - Valid encounter within last 6 months    Recent Outpatient Visits          2 months ago Type 2 diabetes with nephropathy ([place])   [place], [name], DO   11 months ago Seasonal allergic rhinitis due to pollen   [facility], [name], DO   1 year ago Type 2 diabetes with nephropathy [facility])   [place], [name], DO   1 year ago Type 2 diabetes with nephropathy ([place])   [place], [name], DO   1 year ago Routine general medical examination at a health care facility   [place], Barb Merino, DO      Future Appointments            In 2 weeks Wynetta Emery, Barb Merino, DO [facility], [facility]

## 2021-03-15 NOTE — Telephone Encounter (Signed)
Call to pharmacy- patient has broken Rx up by filling 30 days and then 90 days- there is not enough left to RF a 90 day Rx for March- advised if 90 day Rx is sent - the other Rx will be canceled advised pharmacy will send 90 day rx for patient request 90 day supply. Requested Prescriptions  Pending Prescriptions Disp Refills   metFORMIN (GLUCOPHAGE-XR) 500 MG 24 hr tablet [Pharmacy Med Name: METFORMIN HCL ER 500 MG TABLET] 360 tablet 0    Sig: TAKE 2 TABLETS BY MOUTH TWICE A DAY     Endocrinology:  Diabetes - Biguanides Failed - 03/14/2021  6:21 PM      Failed - Cr in normal range and within 360 days    Creatinine, Ser  Date Value Ref Range Status  12/28/2020 1.70 (H) 0.76 - 1.27 mg/dL Final         Failed - eGFR in normal range and within 360 days    GFR calc Af Amer  Date Value Ref Range Status  02/16/2020 38 (L) >59 mL/min/1.73 Final    Comment:    **In accordance with recommendations from the NKF-ASN Task force,**   [facility] is in the process of updating its eGFR calculation to the   2021 CKD-EPI creatinine equation that estimates kidney function   without a race variable.    GFR calc non Af Amer  Date Value Ref Range Status  02/16/2020 33 (L) >59 mL/min/1.73 Final   eGFR  Date Value Ref Range Status  12/28/2020 46 (L) >59 mL/min/1.73 Final         Failed - B12 Level in normal range and within 720 days    No results found for: VITAMINB12       Passed - HBA1C is between 0 and 7.9 and within 180 days    HB A1C (BAYER DCA - WAIVED)  Date Value Ref Range Status  12/28/2020 7.0 (H) 4.8 - 5.6 % Final    Comment:             Prediabetes: 5.7 - 6.4          Diabetes: >6.4          Glycemic control for adults with diabetes: <7.0          Passed - Valid encounter within last 6 months    Recent Outpatient Visits          2 months ago Type 2 diabetes with nephropathy ([place])   [place], [name], DO   11 months ago Seasonal allergic rhinitis due to  pollen   [facility], [name], DO   1 year ago Type 2 diabetes with nephropathy [facility])   [place], [name], DO   1 year ago Type 2 diabetes with nephropathy ([place])   [place], [name], DO   1 year ago Routine general medical examination at a health care facility   [facility], [place], DO      Future Appointments            In 1 week Wynetta Emery, [name], DO [place], [facility]           Passed - CBC within normal limits and completed in the last 12 months    WBC  Date Value Ref Range Status  12/28/2020 7.0 3.4 - 10.8 x10E3/uL Final  02/15/2018 6.0 4.0 - 10.5 K/uL Final   RBC  Date  Value Ref Range Status  12/28/2020 5.24 4.14 - 5.80 x10E6/uL Final  02/15/2018 4.44 4.22 - 5.81 MIL/uL Final   Hemoglobin  Date Value Ref Range Status  12/28/2020 14.2 13.0 - 17.7 g/dL Final   Hematocrit  Date Value Ref Range Status  12/28/2020 43.2 37.5 - 51.0 % Final   MCHC  Date Value Ref Range Status  12/28/2020 32.9 31.5 - 35.7 g/dL Final  02/15/2018 32.6 30.0 - 36.0 g/dL Final   [facility]  Date Value Ref Range Status  12/28/2020 27.1 26.6 - 33.0 pg Final  02/15/2018 27.3 26.0 - 34.0 pg Final   MCV  Date Value Ref Range Status  12/28/2020 82 79 - 97 fL Final   No results found for: PLTCOUNTKUC, LABPLAT, POCPLA RDW  Date Value Ref Range Status  12/28/2020 13.8 11.6 - 15.4 % Final          metoprolol tartrate (LOPRESSOR) 25 MG tablet [Pharmacy Med Name: METOPROLOL TARTRATE 25 MG TAB] 180 tablet 0    Sig: TAKE 1 TABLET BY MOUTH TWICE A DAY     Cardiovascular:  Beta Blockers Passed - 03/14/2021  6:21 PM      Passed - Last BP in normal range    BP Readings from Last 1 Encounters:  12/28/20 136/80         Passed - Last Heart Rate in normal range    Pulse Readings from Last 1 Encounters:  12/28/20 79         Passed - Valid encounter within last 6 months    Recent Outpatient Visits           2 months ago Type 2 diabetes with nephropathy ([place])   [place], [name], DO   11 months ago Seasonal allergic rhinitis due to pollen   [facility], [name], DO   1 year ago Type 2 diabetes with nephropathy [facility])   [place], [name], DO   1 year ago Type 2 diabetes with nephropathy ([place])   [place], [name], DO   1 year ago Routine general medical examination at a health care facility   [place], Barb Merino, DO      Future Appointments            In 1 week Valerie Roys, DO [facility] [place], [facility]

## 2021-03-28 ENCOUNTER — Ambulatory Visit: Payer: Self-pay | Admitting: Family Medicine

## 2021-03-28 DIAGNOSIS — E1121 Type 2 diabetes mellitus with diabetic nephropathy: Secondary | ICD-10-CM

## 2021-04-18 ENCOUNTER — Other Ambulatory Visit: Payer: Self-pay | Admitting: Family Medicine

## 2021-04-19 NOTE — Telephone Encounter (Signed)
Rx for both- 12/30/20 #90 1RF - 6 month supply ?Requested Prescriptions  ?Pending Prescriptions Disp Refills  ?? atorvastatin (LIPITOR) 80 MG tablet [Pharmacy Med Name: ATORVASTATIN 80 MG TABLET] 90 tablet 1  ?  Sig: TAKE 1 TABLET BY MOUTH DAILY AT 6 PM.  ?  ? Cardiovascular:  Antilipid - Statins Failed - 04/18/2021 10:00 AM  ?  ?  Failed - Lipid Panel in normal range within the last 12 months  ?  Cholesterol, Total  ?Date Value Ref Range Status  ?12/28/2020 172 100 - 199 mg/dL Final  ? ?Cholesterol [place], [place]  ?Date Value Ref Range Status  ?11/19/2014 101 <200 mg/dL Final  ?  Comment:  ?                          Desirable                <200 ?                        Borderline High      200- 239 ?                        High                     >239 ?  ? ?LDL Chol Calc (NIH)  ?Date Value Ref Range Status  ?12/28/2020 91 0 - 99 mg/dL Final  ? ?HDL  ?Date Value Ref Range Status  ?12/28/2020 37 (L) >39 mg/dL Final  ? ?Triglycerides  ?Date Value Ref Range Status  ?12/28/2020 262 (H) 0 - 149 mg/dL Final  ? ?Triglycerides Piccolo,Waived  ?Date Value Ref Range Status  ?11/19/2014 61 <150 mg/dL Final  ?  Comment:  ?                          Normal                   <150 ?                        Borderline High     150 - 199 ?                        High                200 - 499 ?                        Very High                >499 ?  ? ?  ?  ?  Passed - Patient is not pregnant  ?  ?  Passed - Valid encounter within last 12 months  ?  Recent Outpatient Visits   ?      ? 3 months ago Type 2 diabetes with nephropathy ([place])  ? [place], [name], DO  ? 1 year ago Seasonal allergic rhinitis due to pollen  ? [place], [place] P, DO  ? 1 year ago Type 2 diabetes with nephropathy ([place])  ? [place], [place] P, DO  ? 1 year ago Type 2 diabetes with nephropathy ([place])  ? [facility] [place], [place]  P, DO  ? 1 year ago Routine general medical examination  at a health care facility  ? [place], [place] P, DO  ?  ?  ? ?  ?  ?  ?? irbesartan (AVAPRO) 300 MG tablet [Pharmacy Med Name: IRBESARTAN 300 MG TABLET] 90 tablet 1  ?  Sig: TAKE 1 TABLET BY MOUTH EVERY DAY  ?  ? Cardiovascular:  Angiotensin Receptor Blockers Failed - 04/18/2021 10:00 AM  ?  ?  Failed - Cr in normal range and within 180 days  ?  Creatinine, Ser  ?Date Value Ref Range Status  ?12/28/2020 1.70 (H) 0.76 - 1.27 mg/dL Final  ?   ?  ?  Passed - K in normal range and within 180 days  ?  Potassium  ?Date Value Ref Range Status  ?12/28/2020 4.1 3.5 - 5.2 mmol/L Final  ?   ?  ?  Passed - Patient is not pregnant  ?  ?  Passed - Last BP in normal range  ?  BP Readings from Last 1 Encounters:  ?12/28/20 136/80  ?   ?  ?  Passed - Valid encounter within last 6 months  ?  Recent Outpatient Visits   ?      ? 3 months ago Type 2 diabetes with nephropathy ([place])  ? [place], [name], DO  ? 1 year ago Seasonal allergic rhinitis due to pollen  ? [place], [place] P, DO  ? 1 year ago Type 2 diabetes with nephropathy ([place])  ? [place], [place] P, DO  ? 1 year ago Type 2 diabetes with nephropathy ([place])  ? [place], DO  ? 1 year ago Routine general medical examination at a health care facility  ? [place], [place] P, DO  ?  ?  ? ?  ?  ?  ? ?

## 2021-05-22 ENCOUNTER — Other Ambulatory Visit: Payer: Self-pay | Admitting: Family Medicine

## 2021-05-23 ENCOUNTER — Other Ambulatory Visit: Payer: Self-pay | Admitting: Family Medicine

## 2021-05-24 NOTE — Telephone Encounter (Signed)
Pt has refill available. Sent via Interface ?Requested Prescriptions  ?Pending Prescriptions Disp Refills  ?? atorvastatin (LIPITOR) 80 MG tablet [Pharmacy Med Name: ATORVASTATIN 80 MG TABLET] 90 tablet 1  ?  Sig: TAKE 1 TABLET BY MOUTH DAILY AT 6 PM.  ?  ? Cardiovascular:  Antilipid - Statins Failed - 05/22/2021 10:45 PM  ?  ?  Failed - Lipid Panel in normal range within the last 12 months  ?  Cholesterol, Total  ?Date Value Ref Range Status  ?12/28/2020 172 100 - 199 mg/dL Final  ? ?Cholesterol Piccolo, [place]  ?Date Value Ref Range Status  ?11/19/2014 101 <200 mg/dL Final  ?  Comment:  ?                          Desirable                <200 ?                        Borderline High      200- 239 ?                        High                     >239 ?  ? ?LDL Chol Calc (NIH)  ?Date Value Ref Range Status  ?12/28/2020 91 0 - 99 mg/dL Final  ? ?HDL  ?Date Value Ref Range Status  ?12/28/2020 37 (L) >39 mg/dL Final  ? ?Triglycerides  ?Date Value Ref Range Status  ?12/28/2020 262 (H) 0 - 149 mg/dL Final  ? ?Triglycerides Piccolo,Waived  ?Date Value Ref Range Status  ?11/19/2014 61 <150 mg/dL Final  ?  Comment:  ?                          Normal                   <150 ?                        Borderline High     150 - 199 ?                        High                200 - 499 ?                        Very High                >499 ?  ? ?  ?  ?  Passed - Patient is not pregnant  ?  ?  Passed - Valid encounter within last 12 months  ?  Recent Outpatient Visits   ?      ? 4 months ago Type 2 diabetes with nephropathy ([place])  ? [place], [name], DO  ? 1 year ago Seasonal allergic rhinitis due to pollen  ? [place], [place] P, DO  ? 1 year ago Type 2 diabetes with nephropathy ([place])  ? [place], [place] P, DO  ? 1 year ago Type 2 diabetes with nephropathy ([place])  ? [place], [place], DO  ?  1 year ago Routine general medical examination  at a health care facility  ? [place], [place] P, DO  ?  ?  ? ?  ?  ?  ?? irbesartan (AVAPRO) 300 MG tablet [Pharmacy Med Name: IRBESARTAN 300 MG TABLET] 90 tablet 1  ?  Sig: TAKE 1 TABLET BY MOUTH EVERY DAY  ?  ? Cardiovascular:  Angiotensin Receptor Blockers Failed - 05/22/2021 10:45 PM  ?  ?  Failed - Cr in normal range and within 180 days  ?  Creatinine, Ser  ?Date Value Ref Range Status  ?12/28/2020 1.70 (H) 0.76 - 1.27 mg/dL Final  ?   ?  ?  Passed - K in normal range and within 180 days  ?  Potassium  ?Date Value Ref Range Status  ?12/28/2020 4.1 3.5 - 5.2 mmol/L Final  ?   ?  ?  Passed - Patient is not pregnant  ?  ?  Passed - Last BP in normal range  ?  BP Readings from Last 1 Encounters:  ?12/28/20 136/80  ?   ?  ?  Passed - Valid encounter within last 6 months  ?  Recent Outpatient Visits   ?      ? 4 months ago Type 2 diabetes with nephropathy ([place])  ? [place], [name], DO  ? 1 year ago Seasonal allergic rhinitis due to pollen  ? [place], [place] P, DO  ? 1 year ago Type 2 diabetes with nephropathy ([place])  ? [place], [place] P, DO  ? 1 year ago Type 2 diabetes with nephropathy ([place])  ? [place], DO  ? 1 year ago Routine general medical examination at a health care facility  ? [place], [place] P, DO  ?  ?  ? ?  ?  ?  ? ? ?

## 2021-05-24 NOTE — Telephone Encounter (Signed)
Requested Prescriptions  ?Pending Prescriptions Disp Refills  ?? hydrochlorothiazide (HYDRODIURIL) 25 MG tablet [Pharmacy Med Name: HYDROCHLOROTHIAZIDE 25 MG TAB] 30 tablet   ?  Sig: TAKE 1 TABLET (25 MG TOTAL) BY MOUTH DAILY.  ?  ? Cardiovascular: Diuretics - Thiazide Failed - 05/23/2021 10:04 AM  ?  ?  Failed - Cr in normal range and within 180 days  ?  Creatinine, Ser  ?Date Value Ref Range Status  ?12/28/2020 1.70 (H) 0.76 - 1.27 mg/dL Final  ?   ?  ?  Passed - K in normal range and within 180 days  ?  Potassium  ?Date Value Ref Range Status  ?12/28/2020 4.1 3.5 - 5.2 mmol/L Final  ?   ?  ?  Passed - Na in normal range and within 180 days  ?  Sodium  ?Date Value Ref Range Status  ?12/28/2020 140 134 - 144 mmol/L Final  ?   ?  ?  Passed - Last BP in normal range  ?  BP Readings from Last 1 Encounters:  ?12/28/20 136/80  ?   ?  ?  Passed - Valid encounter within last 6 months  ?  Recent Outpatient Visits   ?      ? 4 months ago Type 2 diabetes with nephropathy ([place])  ? [place], [name], DO  ? 1 year ago Seasonal allergic rhinitis due to pollen  ? [place], [place] P, DO  ? 1 year ago Type 2 diabetes with nephropathy ([place])  ? [place], [place] P, DO  ? 1 year ago Type 2 diabetes with nephropathy ([place])  ? [place], DO  ? 1 year ago Routine general medical examination at a health care facility  ? [place], [place] P, DO  ?  ?  ? ?  ?  ?  ? ? ?

## 2021-06-05 ENCOUNTER — Other Ambulatory Visit: Payer: Self-pay | Admitting: Family Medicine

## 2021-06-07 NOTE — Telephone Encounter (Signed)
Requested Prescriptions  ?Pending Prescriptions Disp Refills  ?? allopurinol (ZYLOPRIM) 100 MG tablet [Pharmacy Med Name: ALLOPURINOL 100 MG TABLET] 90 tablet 0  ?  Sig: TAKE 1 TABLET BY MOUTH EVERY DAY  ?  ? Endocrinology:  Gout Agents - allopurinol Failed - 06/05/2021  1:48 AM  ?  ?  Failed - Cr in normal range and within 360 days  ?  Creatinine, Ser  ?Date Value Ref Range Status  ?12/28/2020 1.70 (H) 0.76 - 1.27 mg/dL Final  ?   ?  ?  Passed - Uric Acid in normal range and within 360 days  ?  Uric Acid  ?Date Value Ref Range Status  ?12/28/2020 8.3 3.8 - 8.4 mg/dL Final  ?  Comment:  ?             Therapeutic target for gout patients: <6.0  ?   ?  ?  Passed - Valid encounter within last 12 months  ?  Recent Outpatient Visits   ?      ? 5 months ago Type 2 diabetes with nephropathy ([place])  ? [place], [name], DO  ? 1 year ago Seasonal allergic rhinitis due to pollen  ? [place], [place] P, DO  ? 1 year ago Type 2 diabetes with nephropathy ([place])  ? [place], [place] P, DO  ? 1 year ago Type 2 diabetes with nephropathy ([place])  ? [place], DO  ? 1 year ago Routine general medical examination at a health care facility  ? [place], [place] P, DO  ?  ?  ? ?  ?  ?  Passed - CBC within normal limits and completed in the last 12 months  ?  WBC  ?Date Value Ref Range Status  ?12/28/2020 7.0 3.4 - 10.8 x10E3/uL Final  ?02/15/2018 6.0 4.0 - 10.5 K/uL Final  ? ?RBC  ?Date Value Ref Range Status  ?12/28/2020 5.24 4.14 - 5.80 x10E6/uL Final  ?02/15/2018 4.44 4.22 - 5.81 MIL/uL Final  ? ?Hemoglobin  ?Date Value Ref Range Status  ?12/28/2020 14.2 13.0 - 17.7 g/dL Final  ? ?Hematocrit  ?Date Value Ref Range Status  ?12/28/2020 43.2 37.5 - 51.0 % Final  ? ?MCHC  ?Date Value Ref Range Status  ?12/28/2020 32.9 31.5 - 35.7 g/dL Final  ?02/15/2018 32.6 30.0 - 36.0 g/dL Final  ? ?MCH  ?Date Value Ref Range Status   ?12/28/2020 27.1 26.6 - 33.0 pg Final  ?02/15/2018 27.3 26.0 - 34.0 pg Final  ? ?MCV  ?Date Value Ref Range Status  ?12/28/2020 82 79 - 97 fL Final  ? ?No results found for: PLTCOUNTKUC, LABPLAT, [place] ?RDW  ?Date Value Ref Range Status  ?12/28/2020 13.8 11.6 - 15.4 % Final  ? ?  ?  ?  ? ?

## 2021-06-15 ENCOUNTER — Other Ambulatory Visit: Payer: Self-pay | Admitting: Family Medicine

## 2021-06-16 NOTE — Telephone Encounter (Signed)
Requested Prescriptions  Pending Prescriptions Disp Refills  . atorvastatin (LIPITOR) 80 MG tablet [Pharmacy Med Name: ATORVASTATIN 80 MG TABLET] 90 tablet 1    Sig: TAKE 1 TABLET BY MOUTH DAILY AT 6 PM.     Cardiovascular:  Antilipid - Statins Failed - 06/15/2021 10:22 AM      Failed - Lipid Panel in normal range within the last 12 months    Cholesterol, Total  Date Value Ref Range Status  12/28/2020 172 100 - 199 mg/dL Final   Cholesterol Piccolo, Waived  Date Value Ref Range Status  11/19/2014 101 <200 mg/dL Final    Comment:                            Desirable                <200                         Borderline High      200- 239                         High                     >239    LDL Chol Calc (NIH)  Date Value Ref Range Status  12/28/2020 91 0 - 99 mg/dL Final   HDL  Date Value Ref Range Status  12/28/2020 37 (L) >39 mg/dL Final   Triglycerides  Date Value Ref Range Status  12/28/2020 262 (H) 0 - 149 mg/dL Final   Triglycerides Piccolo,Waived  Date Value Ref Range Status  11/19/2014 61 <150 mg/dL Final    Comment:                            Normal                   <150                         Borderline High     150 - 199                         High                200 - 499                         Very High                >499          Passed - Patient is not pregnant      Passed - Valid encounter within last 12 months    Recent Outpatient Visits          5 months ago Type 2 diabetes with nephropathy ([place])   [place], [name], DO   1 year ago Seasonal allergic rhinitis due to pollen   [facility], [name], DO   1 year ago Type 2 diabetes with nephropathy [facility])   [place], [name], DO   1 year ago Type 2 diabetes with nephropathy [facility])   [place], [name], DO   1 year ago Routine general medical  examination at a health care facility   [facility], [place] P, DO             . hydrALAZINE (APRESOLINE) 100 MG tablet [Pharmacy Med Name: HYDRALAZINE 100 MG TABLET] 180 tablet 0    Sig: TAKE 1 TABLET BY MOUTH TWICE A DAY     Cardiovascular:  Vasodilators Failed - 06/15/2021 10:22 AM      Failed - ANA Screen, Ifa, Serum in normal range and within 360 days    No results found for: ANA, ANATITER, LABANTI       Passed - HCT in normal range and within 360 days    Hematocrit  Date Value Ref Range Status  12/28/2020 43.2 37.5 - 51.0 % Final         Passed - HGB in normal range and within 360 days    Hemoglobin  Date Value Ref Range Status  12/28/2020 14.2 13.0 - 17.7 g/dL Final         Passed - RBC in normal range and within 360 days    RBC  Date Value Ref Range Status  12/28/2020 5.24 4.14 - 5.80 x10E6/uL Final  02/15/2018 4.44 4.22 - 5.81 MIL/uL Final         Passed - WBC in normal range and within 360 days    WBC  Date Value Ref Range Status  12/28/2020 7.0 3.4 - 10.8 x10E3/uL Final  02/15/2018 6.0 4.0 - 10.5 K/uL Final         Passed - PLT in normal range and within 360 days    Platelets  Date Value Ref Range Status  12/28/2020 265 150 - 450 x10E3/uL Final         Passed - Last BP in normal range    BP Readings from Last 1 Encounters:  12/28/20 136/80         Passed - Valid encounter within last 12 months    Recent Outpatient Visits          5 months ago Type 2 diabetes with nephropathy ([place])   [place], [name], DO   1 year ago Seasonal allergic rhinitis due to pollen   [facility], [name], DO   1 year ago Type 2 diabetes with nephropathy ([place])   [place], [name], DO   1 year ago Type 2 diabetes with nephropathy ([place])   [place], [name], DO   1 year ago Routine general medical examination at a health care facility   [facility], [name], DO             . irbesartan  (AVAPRO) 300 MG tablet [Pharmacy Med Name: IRBESARTAN 300 MG TABLET] 90 tablet 0    Sig: TAKE 1 TABLET BY MOUTH EVERY DAY     Cardiovascular:  Angiotensin Receptor Blockers Failed - 06/15/2021 10:22 AM      Failed - Cr in normal range and within 180 days    Creatinine, Ser  Date Value Ref Range Status  12/28/2020 1.70 (H) 0.76 - 1.27 mg/dL Final         Passed - K in normal range and within 180 days    Potassium  Date Value Ref Range Status  12/28/2020 4.1 3.5 - 5.2 mmol/L Final         Passed - Patient is not pregnant      Passed - Last BP in normal range  BP Readings from Last 1 Encounters:  12/28/20 136/80         Passed - Valid encounter within last 6 months    Recent Outpatient Visits          5 months ago Type 2 diabetes with nephropathy [facility])   [place], [place], DO   1 year ago Seasonal allergic rhinitis due to pollen   [facility], [name], DO   1 year ago Type 2 diabetes with nephropathy [facility])   [place], [name], DO   1 year ago Type 2 diabetes with nephropathy [facility])   [place], [name], DO   1 year ago Routine general medical examination at a health care facility   [facility], [place], DO

## 2021-07-07 ENCOUNTER — Other Ambulatory Visit: Payer: Self-pay | Admitting: Family Medicine

## 2021-07-08 NOTE — Telephone Encounter (Signed)
Refilled 05/24/2021 #30 2 refills. Requested Prescriptions  Pending Prescriptions Disp Refills  . hydrochlorothiazide (HYDRODIURIL) 25 MG tablet [Pharmacy Med Name: HYDROCHLOROTHIAZIDE 25 MG TAB] 90 tablet 1    Sig: TAKE 1 TABLET (25 MG TOTAL) BY MOUTH DAILY.     Cardiovascular: Diuretics - Thiazide Failed - 07/07/2021  6:32 PM      Failed - Cr in normal range and within 180 days    Creatinine, Ser  Date Value Ref Range Status  12/28/2020 1.70 (H) 0.76 - 1.27 mg/dL Final         Failed - K in normal range and within 180 days    Potassium  Date Value Ref Range Status  12/28/2020 4.1 3.5 - 5.2 mmol/L Final         Failed - Na in normal range and within 180 days    Sodium  Date Value Ref Range Status  12/28/2020 140 134 - 144 mmol/L Final         Failed - Valid encounter within last 6 months    Recent Outpatient Visits          6 months ago Type 2 diabetes with nephropathy ([place])   [place], [name], DO   1 year ago Seasonal allergic rhinitis due to pollen   [facility], [name], DO   1 year ago Type 2 diabetes with nephropathy [facility])   [place], [name], DO   1 year ago Type 2 diabetes with nephropathy [facility])   [place], [name], DO   1 year ago Routine general medical examination at a health care facility   [facility], [name], DO             Passed - Last BP in normal range    BP Readings from Last 1 Encounters:  12/28/20 136/80

## 2021-07-16 ENCOUNTER — Other Ambulatory Visit: Payer: Self-pay | Admitting: Family Medicine

## 2021-07-24 ENCOUNTER — Other Ambulatory Visit: Payer: Self-pay | Admitting: Family Medicine

## 2021-07-25 NOTE — Telephone Encounter (Signed)
Rx 05/24/21 #30 2RF- too soon Requested Prescriptions  Pending Prescriptions Disp Refills  . hydrochlorothiazide (HYDRODIURIL) 25 MG tablet [Pharmacy Med Name: HYDROCHLOROTHIAZIDE 25 MG TAB] 90 tablet 1    Sig: TAKE 1 TABLET (25 MG TOTAL) BY MOUTH DAILY.     Cardiovascular: Diuretics - Thiazide Failed - 07/24/2021 12:01 PM      Failed - Cr in normal range and within 180 days    Creatinine, Ser  Date Value Ref Range Status  12/28/2020 1.70 (H) 0.76 - 1.27 mg/dL Final         Failed - K in normal range and within 180 days    Potassium  Date Value Ref Range Status  12/28/2020 4.1 3.5 - 5.2 mmol/L Final         Failed - Na in normal range and within 180 days    Sodium  Date Value Ref Range Status  12/28/2020 140 134 - 144 mmol/L Final         Failed - Valid encounter within last 6 months    Recent Outpatient Visits          6 months ago Type 2 diabetes with nephropathy ([place])   [place], [name], DO   1 year ago Seasonal allergic rhinitis due to pollen   [facility], [name], DO   1 year ago Type 2 diabetes with nephropathy [facility])   [place], [name], DO   1 year ago Type 2 diabetes with nephropathy [facility])   [place], [name], DO   1 year ago Routine general medical examination at a health care facility   [facility], [name], DO             Passed - Last BP in normal range    BP Readings from Last 1 Encounters:  12/28/20 136/80

## 2021-07-31 ENCOUNTER — Other Ambulatory Visit: Payer: Self-pay | Admitting: Family Medicine

## 2021-08-01 ENCOUNTER — Encounter: Payer: Self-pay | Admitting: *Deleted

## 2021-08-01 NOTE — Telephone Encounter (Signed)
Called and MyChart messaged pt, no appt scheduled. Requested Prescriptions  Pending Prescriptions Disp Refills  . hydrochlorothiazide (HYDRODIURIL) 25 MG tablet [Pharmacy Med Name: HYDROCHLOROTHIAZIDE 25 MG TAB] 90 tablet 1    Sig: TAKE 1 TABLET (25 MG TOTAL) BY MOUTH DAILY.     Cardiovascular: Diuretics - Thiazide Failed - 07/31/2021  5:05 PM      Failed - Cr in normal range and within 180 days    Creatinine, Ser  Date Value Ref Range Status  12/28/2020 1.70 (H) 0.76 - 1.27 mg/dL Final         Failed - K in normal range and within 180 days    Potassium  Date Value Ref Range Status  12/28/2020 4.1 3.5 - 5.2 mmol/L Final         Failed - Na in normal range and within 180 days    Sodium  Date Value Ref Range Status  12/28/2020 140 134 - 144 mmol/L Final         Failed - Valid encounter within last 6 months    Recent Outpatient Visits          7 months ago Type 2 diabetes with nephropathy ([place])   [name], [name], DO   1 year ago Seasonal allergic rhinitis due to pollen   [facility], [name], DO   1 year ago Type 2 diabetes with nephropathy [facility])   [place], [name], DO   1 year ago Type 2 diabetes with nephropathy [facility])   [place], [name], DO   1 year ago Routine general medical examination at a health care facility   [facility], [name], DO             Passed - Last BP in normal range    BP Readings from Last 1 Encounters:  12/28/20 136/80

## 2021-08-01 NOTE — Telephone Encounter (Signed)
Called both numbers and there was no voicemail set up on mobile phone and no answer to home phone.

## 2021-08-11 ENCOUNTER — Other Ambulatory Visit: Payer: Self-pay | Admitting: Family Medicine

## 2021-08-12 NOTE — Telephone Encounter (Signed)
Requested medication (s) are due for refill today - yes  Requested medication (s) are on the active medication list -yes  Future visit scheduled -no  Last refill: 05/24/21 #30 2RF  Notes to clinic: Courtesy RF has been given- request sent for review   Requested Prescriptions  Pending Prescriptions Disp Refills   hydrochlorothiazide (HYDRODIURIL) 25 MG tablet [Pharmacy Med Name: HYDROCHLOROTHIAZIDE 25 MG TAB] 90 tablet 1    Sig: TAKE 1 TABLET (25 MG TOTAL) BY MOUTH DAILY.     Cardiovascular: Diuretics - Thiazide Failed - 08/11/2021  1:03 PM      Failed - Cr in normal range and within 180 days    Creatinine, Ser  Date Value Ref Range Status  12/28/2020 1.70 (H) 0.76 - 1.27 mg/dL Final         Failed - K in normal range and within 180 days    Potassium  Date Value Ref Range Status  12/28/2020 4.1 3.5 - 5.2 mmol/L Final         Failed - Na in normal range and within 180 days    Sodium  Date Value Ref Range Status  12/28/2020 140 134 - 144 mmol/L Final         Failed - Valid encounter within last 6 months    Recent Outpatient Visits           7 months ago Type 2 diabetes with nephropathy ([place])   [place], [name], DO   1 year ago Seasonal allergic rhinitis due to pollen   [facility], [name], DO   1 year ago Type 2 diabetes with nephropathy [facility])   [place], [name], DO   1 year ago Type 2 diabetes with nephropathy [facility])   [place], [name], DO   1 year ago Routine general medical examination at a health care facility   [facility], [place] P, DO              Passed - Last BP in normal range    BP Readings from Last 1 Encounters:  12/28/20 136/80            Requested Prescriptions  Pending Prescriptions Disp Refills   hydrochlorothiazide (HYDRODIURIL) 25 MG tablet [Pharmacy Med Name: HYDROCHLOROTHIAZIDE 25 MG TAB] 90 tablet 1    Sig: TAKE 1 TABLET  (25 MG TOTAL) BY MOUTH DAILY.     Cardiovascular: Diuretics - Thiazide Failed - 08/11/2021  1:03 PM      Failed - Cr in normal range and within 180 days    Creatinine, Ser  Date Value Ref Range Status  12/28/2020 1.70 (H) 0.76 - 1.27 mg/dL Final         Failed - K in normal range and within 180 days    Potassium  Date Value Ref Range Status  12/28/2020 4.1 3.5 - 5.2 mmol/L Final         Failed - Na in normal range and within 180 days    Sodium  Date Value Ref Range Status  12/28/2020 140 134 - 144 mmol/L Final         Failed - Valid encounter within last 6 months    Recent Outpatient Visits           7 months ago Type 2 diabetes with nephropathy [facility])   [facility], [name], DO   1 year ago Seasonal allergic rhinitis due to  pollen   [facility], [place] P, DO   1 year ago Type 2 diabetes with nephropathy [facility])   [place], [name], DO   1 year ago Type 2 diabetes with nephropathy [facility])   [place], [name], DO   1 year ago Routine general medical examination at a health care facility   [facility], [place] P, DO              Passed - Last BP in normal range    BP Readings from Last 1 Encounters:  12/28/20 136/80

## 2021-08-26 ENCOUNTER — Other Ambulatory Visit: Payer: Self-pay | Admitting: Family Medicine

## 2021-08-29 NOTE — Telephone Encounter (Signed)
Requested medication (s) are due for refill today: yes  Requested medication (s) are on the active medication list: yes  Last refill:  05/24/21 #30/2  Future visit scheduled: no  Notes to clinic:  pt is overdue for labs and appt. Pt contacted, LVMTCB on wife's cell     Requested Prescriptions  Pending Prescriptions Disp Refills   hydrochlorothiazide (HYDRODIURIL) 25 MG tablet [Pharmacy Med Name: HYDROCHLOROTHIAZIDE 25 MG TAB] 90 tablet 1    Sig: TAKE 1 TABLET (25 MG TOTAL) BY MOUTH DAILY.     Cardiovascular: Diuretics - Thiazide Failed - 08/26/2021  5:38 PM      Failed - Cr in normal range and within 180 days    Creatinine, Ser  Date Value Ref Range Status  12/28/2020 1.70 (H) 0.76 - 1.27 mg/dL Final         Failed - K in normal range and within 180 days    Potassium  Date Value Ref Range Status  12/28/2020 4.1 3.5 - 5.2 mmol/L Final         Failed - Na in normal range and within 180 days    Sodium  Date Value Ref Range Status  12/28/2020 140 134 - 144 mmol/L Final         Failed - Valid encounter within last 6 months    Recent Outpatient Visits           8 months ago Type 2 diabetes with nephropathy ([place])   [place], [name], DO   1 year ago Seasonal allergic rhinitis due to pollen   [facility], [name], DO   1 year ago Type 2 diabetes with nephropathy [facility])   [place], [name], DO   1 year ago Type 2 diabetes with nephropathy [facility])   [employer], [name], DO   2 years ago Routine general medical examination at a health care facility   [facility], [name], DO              Passed - Last BP in normal range    BP Readings from Last 1 Encounters:  12/28/20 136/80

## 2021-08-29 NOTE — Telephone Encounter (Signed)
Pt called on cell #, no VM available. Called pt's wife cell #, LVMTCB to schedule an appt.

## 2021-09-09 ENCOUNTER — Other Ambulatory Visit: Payer: Self-pay | Admitting: Family Medicine

## 2021-09-12 NOTE — Telephone Encounter (Signed)
Requested medication (s) are due for refill today: Yes  Requested medication (s) are on the active medication list: Yes  Last refill:  06/16/21  Future visit scheduled: No  Notes to clinic:  Unable to refill per protocol, appointment needed.      Requested Prescriptions  Pending Prescriptions Disp Refills   hydrochlorothiazide (HYDRODIURIL) 25 MG tablet [Pharmacy Med Name: HYDROCHLOROTHIAZIDE 25 MG TAB] 90 tablet 1    Sig: TAKE 1 TABLET (25 MG TOTAL) BY MOUTH DAILY.     Cardiovascular: Diuretics - Thiazide Failed - 09/09/2021  6:39 PM      Failed - Cr in normal range and within 180 days    Creatinine, Ser  Date Value Ref Range Status  12/28/2020 1.70 (H) 0.76 - 1.27 mg/dL Final         Failed - K in normal range and within 180 days    Potassium  Date Value Ref Range Status  12/28/2020 4.1 3.5 - 5.2 mmol/L Final         Failed - Na in normal range and within 180 days    Sodium  Date Value Ref Range Status  12/28/2020 140 134 - 144 mmol/L Final         Failed - Valid encounter within last 6 months    Recent Outpatient Visits           8 months ago Type 2 diabetes with nephropathy ([place])   [place], [place] P, DO   1 year ago Seasonal allergic rhinitis due to pollen   [facility], [name], DO   1 year ago Type 2 diabetes with nephropathy [facility])   [place], [name], DO   1 year ago Type 2 diabetes with nephropathy [facility])   [place], [name], DO   2 years ago Routine general medical examination at a health care facility   [facility], [name], DO              Passed - Last BP in normal range    BP Readings from Last 1 Encounters:  12/28/20 136/80

## 2021-09-12 NOTE — Telephone Encounter (Signed)
Patient called on both numbers listed, no answer on home phone just rings, cell not in service. Will need OV for further refills, will route to office.

## 2021-09-15 ENCOUNTER — Other Ambulatory Visit: Payer: Self-pay | Admitting: Family Medicine

## 2021-09-16 NOTE — Telephone Encounter (Signed)
Requested medications are due for refill today.  yes  Requested medications are on the active medications list.  yes  Last refill. 05/24/2021 #30 2 refills  Future visit scheduled.   no  Notes to clinic.  PT overdue for OV.    Requested Prescriptions  Pending Prescriptions Disp Refills   hydrochlorothiazide (HYDRODIURIL) 25 MG tablet [Pharmacy Med Name: HYDROCHLOROTHIAZIDE 25 MG TAB] 90 tablet 1    Sig: TAKE 1 TABLET (25 MG TOTAL) BY MOUTH DAILY.     Cardiovascular: Diuretics - Thiazide Failed - 09/15/2021  6:52 PM      Failed - Cr in normal range and within 180 days    Creatinine, Ser  Date Value Ref Range Status  12/28/2020 1.70 (H) 0.76 - 1.27 mg/dL Final         Failed - K in normal range and within 180 days    Potassium  Date Value Ref Range Status  12/28/2020 4.1 3.5 - 5.2 mmol/L Final         Failed - Na in normal range and within 180 days    Sodium  Date Value Ref Range Status  12/28/2020 140 134 - 144 mmol/L Final         Failed - Valid encounter within last 6 months    Recent Outpatient Visits           8 months ago Type 2 diabetes with nephropathy ([place])   [place], [name], DO   1 year ago Seasonal allergic rhinitis due to pollen   [facility], [name], DO   1 year ago Type 2 diabetes with nephropathy [facility])   [place], [name], DO   1 year ago Type 2 diabetes with nephropathy [facility])   [place], [name], DO   2 years ago Routine general medical examination at a health care facility   [facility], [name], DO              Passed - Last BP in normal range    BP Readings from Last 1 Encounters:  12/28/20 136/80

## 2021-09-30 ENCOUNTER — Other Ambulatory Visit: Payer: Self-pay | Admitting: Family Medicine

## 2021-10-04 NOTE — Telephone Encounter (Signed)
Requested medications are due for refill today.  yes  Requested medications are on the active medications list.  yes  Last refill. 05/24/2021 #30 2 refills  Future visit scheduled.   no  Notes to clinic.  PT is 3 months overdue for OV.    Requested Prescriptions  Pending Prescriptions Disp Refills   hydrochlorothiazide (HYDRODIURIL) 25 MG tablet [Pharmacy Med Name: HYDROCHLOROTHIAZIDE 25 MG TAB] 90 tablet 1    Sig: TAKE 1 TABLET (25 MG TOTAL) BY MOUTH DAILY.     Cardiovascular: Diuretics - Thiazide Failed - 09/30/2021  5:47 PM      Failed - Cr in normal range and within 180 days    Creatinine, Ser  Date Value Ref Range Status  12/28/2020 1.70 (H) 0.76 - 1.27 mg/dL Final         Failed - K in normal range and within 180 days    Potassium  Date Value Ref Range Status  12/28/2020 4.1 3.5 - 5.2 mmol/L Final         Failed - Na in normal range and within 180 days    Sodium  Date Value Ref Range Status  12/28/2020 140 134 - 144 mmol/L Final         Failed - Valid encounter within last 6 months    Recent Outpatient Visits           9 months ago Type 2 diabetes with nephropathy ([place])   [place], [place] P, DO   1 year ago Seasonal allergic rhinitis due to pollen   [facility], [name], DO   1 year ago Type 2 diabetes with nephropathy [facility])   [place], [name], DO   1 year ago Type 2 diabetes with nephropathy [facility])   [place], [name], DO   2 years ago Routine general medical examination at a health care facility   [facility], [name], DO              Passed - Last BP in normal range    BP Readings from Last 1 Encounters:  12/28/20 136/80

## 2021-10-18 ENCOUNTER — Other Ambulatory Visit: Payer: Self-pay | Admitting: Family Medicine

## 2021-10-18 NOTE — Telephone Encounter (Signed)
Pt called to schedule appt so refill can be completed, mobile number was answered but that person said Anthony Sellers does not have that number anymore. Home number rings and does not go to voicemail.

## 2021-10-18 NOTE — Telephone Encounter (Signed)
Filled 6 days ago, needs appt. Requested Prescriptions  Pending Prescriptions Disp Refills  . hydrochlorothiazide (HYDRODIURIL) 25 MG tablet [Pharmacy Med Name: HYDROCHLOROTHIAZIDE 25 MG TAB] 90 tablet 1    Sig: TAKE 1 TABLET (25 MG TOTAL) BY MOUTH DAILY.     Cardiovascular: Diuretics - Thiazide Failed - 10/18/2021  3:11 PM      Failed - Cr in normal range and within 180 days    Creatinine, Ser  Date Value Ref Range Status  12/28/2020 1.70 (H) 0.76 - 1.27 mg/dL Final         Failed - K in normal range and within 180 days    Potassium  Date Value Ref Range Status  12/28/2020 4.1 3.5 - 5.2 mmol/L Final         Failed - Na in normal range and within 180 days    Sodium  Date Value Ref Range Status  12/28/2020 140 134 - 144 mmol/L Final         Failed - Valid encounter within last 6 months    Recent Outpatient Visits          9 months ago Type 2 diabetes with nephropathy ([place])   [place], [name], DO   1 year ago Seasonal allergic rhinitis due to pollen   [facility], [name], DO   1 year ago Type 2 diabetes with nephropathy [facility]   [place], [name], DO   1 year ago Type 2 diabetes with nephropathy [facility])   [place], [name], DO   2 years ago Routine general medical examination at a health care facility   [facility], [name], DO             Passed - Last BP in normal range    BP Readings from Last 1 Encounters:  12/28/20 136/80

## 2021-10-28 ENCOUNTER — Other Ambulatory Visit: Payer: Self-pay | Admitting: Family Medicine

## 2021-10-31 NOTE — Telephone Encounter (Signed)
Requested medications are due for refill today.  yes  Requested medications are on the active medications list.  yes  Last refill. 05/24/2021 #30 2 rf  Future visit scheduled.   no  Notes to clinic.  PT is overdue for OV.     Requested Prescriptions  Pending Prescriptions Disp Refills   hydrochlorothiazide (HYDRODIURIL) 25 MG tablet [Pharmacy Med Name: HYDROCHLOROTHIAZIDE 25 MG TAB] 90 tablet 1    Sig: TAKE 1 TABLET (25 MG TOTAL) BY MOUTH DAILY.     Cardiovascular: Diuretics - Thiazide Failed - 10/28/2021  7:28 PM      Failed - Cr in normal range and within 180 days    Creatinine, Ser  Date Value Ref Range Status  12/28/2020 1.70 (H) 0.76 - 1.27 mg/dL Final         Failed - K in normal range and within 180 days    Potassium  Date Value Ref Range Status  12/28/2020 4.1 3.5 - 5.2 mmol/L Final         Failed - Na in normal range and within 180 days    Sodium  Date Value Ref Range Status  12/28/2020 140 134 - 144 mmol/L Final         Failed - Valid encounter within last 6 months    Recent Outpatient Visits           10 months ago Type 2 diabetes with nephropathy ([place])   [place], [name], DO   1 year ago Seasonal allergic rhinitis due to pollen   [facility], [name], DO   1 year ago Type 2 diabetes with nephropathy [facility])   [place], [name], DO   1 year ago Type 2 diabetes with nephropathy [facility])   [place], [name], DO   2 years ago Routine general medical examination at a health care facility   [facility], [name], DO              Passed - Last BP in normal range    BP Readings from Last 1 Encounters:  12/28/20 136/80

## 2021-11-01 ENCOUNTER — Other Ambulatory Visit: Payer: Self-pay | Admitting: Family Medicine

## 2021-11-01 NOTE — Telephone Encounter (Signed)
Requested medication (s) are due for refill today: yes  Requested medication (s) are on the active medication list: yes  Last refill:  Avapro 06/16/21 #90 with 0 RF, Allopurinol 06/07/21 #90 with 0 RF, Hydralazine 06/16/21 #180 with 0 RF, Metoprolol 03/15/21 #180 with 0 RF    Future visit scheduled: no, last seen 12/28/21, NO SHOW 03/28/21  Notes to clinic:   has already had curtesy refills, sent MyChart messages, called with no results, no upcoming appt, please assess.      Requested Prescriptions  Pending Prescriptions Disp Refills   irbesartan (AVAPRO) 300 MG tablet [Pharmacy Med Name: IRBESARTAN 300 MG TABLET] 7 tablet 12    Sig: TAKE 1 TABLET BY MOUTH EVERY DAY     Cardiovascular:  Angiotensin Receptor Blockers Failed - 11/01/2021  2:23 AM      Failed - Cr in normal range and within 180 days    Creatinine, Ser  Date Value Ref Range Status  12/28/2020 1.70 (H) 0.76 - 1.27 mg/dL Final         Failed - K in normal range and within 180 days    Potassium  Date Value Ref Range Status  12/28/2020 4.1 3.5 - 5.2 mmol/L Final         Failed - Valid encounter within last 6 months    Recent Outpatient Visits           10 months ago Type 2 diabetes with nephropathy ([place])   [place], [name], DO   1 year ago Seasonal allergic rhinitis due to pollen   [facility], [name], DO   1 year ago Type 2 diabetes with nephropathy ([place])   [place], [name], DO   1 year ago Type 2 diabetes with nephropathy ([place])   [place], [name], DO   2 years ago Routine general medical examination at a health care facility   [facility] [place] [place]              Passed - Patient is not pregnant      Passed - Last BP in normal range    BP Readings from Last 1 Encounters:  12/28/20 136/80          allopurinol (ZYLOPRIM) 100 MG tablet [Pharmacy Med Name: ALLOPURINOL 100 MG TABLET] 7  tablet 12    Sig: TAKE [place]     Endocrinology:  Gout Agents - allopurinol Failed - 11/01/2021  2:23 AM      Failed - Cr in normal range and within 360 days    Creatinine, Ser  Date Value Ref Range Status  12/28/2020 1.70 (H) 0.76 - 1.27 mg/dL Final         Passed - Uric Acid in normal range and within 360 days    Uric Acid  Date Value Ref Range Status  12/28/2020 8.3 3.8 - 8.4 mg/dL Final    Comment:               Therapeutic target for gout patients: <6.0         Passed - Valid encounter within last 12 months    Recent Outpatient Visits           10 months ago Type 2 diabetes with nephropathy ([place])   [place], [name], DO   1 year ago Seasonal allergic rhinitis due to pollen   [facility] [place], [place] P, DO   1 year ago Type 2 diabetes with nephropathy ([place])   [place], [name], DO   1 year ago Type 2 diabetes with nephropathy ([place])   [place], [name], DO   2 years ago Routine general medical examination at a health care facility   [facility], [place] P, DO              Passed - CBC within normal limits and completed in the last 12 months    WBC  Date Value Ref Range Status  12/28/2020 7.0 3.4 - 10.8 x10E3/uL Final  02/15/2018 6.0 4.0 - 10.5 K/uL Final   RBC  Date Value Ref Range Status  12/28/2020 5.24 4.14 - 5.80 x10E6/uL Final  02/15/2018 4.44 4.22 - 5.81 MIL/uL Final   Hemoglobin  Date Value Ref Range Status  12/28/2020 14.2 13.0 - 17.7 g/dL Final   Hematocrit  Date Value Ref Range Status  12/28/2020 43.2 37.5 - 51.0 % Final   MCHC  Date Value Ref Range Status  12/28/2020 32.9 31.5 - 35.7 g/dL Final  02/15/2018 32.6 30.0 - 36.0 g/dL Final   [facility]  Date Value Ref Range Status  12/28/2020 27.1 26.6 - 33.0 pg Final  02/15/2018 27.3 26.0 - 34.0 pg Final   MCV  Date Value Ref Range Status  12/28/2020 82 79 - 97 fL Final    No results found for: "PLTCOUNTKUC", "LABPLAT", "POCPLA" RDW  Date Value Ref Range Status  12/28/2020 13.8 11.6 - 15.4 % Final          hydrALAZINE (APRESOLINE) 100 MG tablet [Pharmacy Med Name: HYDRALAZINE 100 MG TABLET] 14 tablet 12    Sig: TAKE 1 TABLET BY MOUTH TWICE A DAY     Cardiovascular:  Vasodilators Failed - 11/01/2021  2:23 AM      Failed - ANA Screen, Ifa, Serum in normal range and within 360 days    No results found for: "ANA", "ANATITER", "LABANTI"       Passed - HCT in normal range and within 360 days    Hematocrit  Date Value Ref Range Status  12/28/2020 43.2 37.5 - 51.0 % Final         Passed - HGB in normal range and within 360 days    Hemoglobin  Date Value Ref Range Status  12/28/2020 14.2 13.0 - 17.7 g/dL Final         Passed - RBC in normal range and within 360 days    RBC  Date Value Ref Range Status  12/28/2020 5.24 4.14 - 5.80 x10E6/uL Final  02/15/2018 4.44 4.22 - 5.81 MIL/uL Final         Passed - WBC in normal range and within 360 days    WBC  Date Value Ref Range Status  12/28/2020 7.0 3.4 - 10.8 x10E3/uL Final  02/15/2018 6.0 4.0 - 10.5 K/uL Final         Passed - PLT in normal range and within 360 days    Platelets  Date Value Ref Range Status  12/28/2020 265 150 - 450 x10E3/uL Final         Passed - Last BP in normal range    BP Readings from Last 1 Encounters:  12/28/20 136/80         Passed - Valid encounter within last 12 months    Recent Outpatient Visits  10 months ago Type 2 diabetes with nephropathy [facility]   [place], [place], DO   1 year ago Seasonal allergic rhinitis due to pollen   [facility], [name], DO   1 year ago Type 2 diabetes with nephropathy [facility])   [place], [name], DO   1 year ago Type 2 diabetes with nephropathy [facility])   [place], [name], DO   2 years ago Routine general medical examination at  a health care facility   [facility], [place] P, DO               metoprolol tartrate (LOPRESSOR) 25 MG tablet [Pharmacy Med Name: METOPROLOL TARTRATE 25 MG TAB] 180 tablet 0    Sig: TAKE 1 TABLET BY MOUTH TWICE A DAY     Cardiovascular:  Beta Blockers Failed - 11/01/2021  2:23 AM      Failed - Valid encounter within last 6 months    Recent Outpatient Visits           10 months ago Type 2 diabetes with nephropathy ([place])   [place], [name], DO   1 year ago Seasonal allergic rhinitis due to pollen   [facility], [name], DO   1 year ago Type 2 diabetes with nephropathy [facility])   [place], [name], DO   1 year ago Type 2 diabetes with nephropathy [facility])   [place], [name], DO   2 years ago Routine general medical examination at a health care facility   [facility], [place] P, DO              Passed - Last BP in normal range    BP Readings from Last 1 Encounters:  12/28/20 136/80         Passed - Last Heart Rate in normal range    Pulse Readings from Last 1 Encounters:  12/28/20 79

## 2021-11-01 NOTE — Telephone Encounter (Signed)
Unable to reach pt on either line. The phone listed as his cell was answered and the person said they were not Anthony Sellers. The home phone just rings. I have sent MyChart messages in the past and he has not answered.

## 2021-11-03 ENCOUNTER — Other Ambulatory Visit: Payer: Self-pay | Admitting: Family Medicine

## 2021-11-03 NOTE — Telephone Encounter (Signed)
Requested Prescriptions  Refused Prescriptions Disp Refills  . metoprolol tartrate (LOPRESSOR) 25 MG tablet [Pharmacy Med Name: METOPROLOL TARTRATE 25 MG TAB] 180 tablet 0    Sig: TAKE 1 TABLET BY MOUTH TWICE A DAY     Cardiovascular:  Beta Blockers Failed - 11/03/2021 11:17 AM      Failed - Valid encounter within last 6 months    Recent Outpatient Visits          10 months ago Type 2 diabetes with nephropathy ([place])   [place], [name], DO   1 year ago Seasonal allergic rhinitis due to pollen   [facility], [name], DO   1 year ago Type 2 diabetes with nephropathy [facility])   [place], [name], DO   1 year ago Type 2 diabetes with nephropathy [facility])   [place], [name], DO   2 years ago Routine general medical examination at a health care facility   [facility], [place] P, DO             Passed - Last BP in normal range    BP Readings from Last 1 Encounters:  12/28/20 136/80         Passed - Last Heart Rate in normal range    Pulse Readings from Last 1 Encounters:  12/28/20 79

## 2021-11-26 ENCOUNTER — Telehealth: Payer: Self-pay | Admitting: Family Medicine

## 2021-11-28 NOTE — Telephone Encounter (Signed)
Called patient to schedule an appointment. Unable to leave vm due to no mailbox.

## 2021-11-28 NOTE — Telephone Encounter (Signed)
Please call patient and schedule OV, last seen December 2022

## 2021-11-28 NOTE — Telephone Encounter (Signed)
Requested medications are due for refill today.  yes  Requested medications are on the active medications list.  yes  Last refill. 05/24/2021 #30 2 rf  Future visit scheduled.   no  Notes to clinic.  Pt is more than 3 months overdue for OV    Requested Prescriptions  Pending Prescriptions Disp Refills   hydrochlorothiazide (HYDRODIURIL) 25 MG tablet [Pharmacy Med Name: HYDROCHLOROTHIAZIDE 25 MG TAB] 90 tablet 1    Sig: TAKE 1 TABLET (25 MG TOTAL) BY MOUTH DAILY.     Cardiovascular: Diuretics - Thiazide Failed - 11/26/2021 10:57 AM      Failed - Cr in normal range and within 180 days    Creatinine, Ser  Date Value Ref Range Status  12/28/2020 1.70 (H) 0.76 - 1.27 mg/dL Final         Failed - K in normal range and within 180 days    Potassium  Date Value Ref Range Status  12/28/2020 4.1 3.5 - 5.2 mmol/L Final         Failed - Na in normal range and within 180 days    Sodium  Date Value Ref Range Status  12/28/2020 140 134 - 144 mmol/L Final         Failed - Valid encounter within last 6 months    Recent Outpatient Visits           11 months ago Type 2 diabetes with nephropathy ([place])   [place], [name], DO   1 year ago Seasonal allergic rhinitis due to pollen   [facility], [name], DO   1 year ago Type 2 diabetes with nephropathy [facility])   [place], [name], DO   2 years ago Type 2 diabetes with nephropathy [facility])   [place], [place], DO   2 years ago Routine general medical examination at a health care facility   [facility], [name], DO              Passed - Last BP in normal range    BP Readings from Last 1 Encounters:  12/28/20 136/80

## 2021-11-30 ENCOUNTER — Other Ambulatory Visit: Payer: Self-pay | Admitting: Family Medicine

## 2021-11-30 NOTE — Telephone Encounter (Signed)
Pt scheduled 11/10

## 2021-11-30 NOTE — Telephone Encounter (Signed)
Requested medication (s) are due for refill today: yes  Requested medication (s) are on the active medication list: yes  Last refill:  12/30/20 #90 with 1 RF  Future visit scheduled: no, pt was to return in March and did not, have tried to contact pt by phone and [employer], unable to leave voicemail and pt has not read MyChart message.  Notes to clinic:  Failed protocol due to no valid visit within 6  months, unable to reach, no voicemail, please assess.       Requested Prescriptions  Pending Prescriptions Disp Refills   amLODipine (NORVASC) 10 MG tablet [Pharmacy Med Name: AMLODIPINE BESYLATE 10 MG TAB] 30 tablet 5    Sig: TAKE 1 TABLET BY MOUTH EVERY DAY     Cardiovascular: Calcium Channel Blockers 2 Failed - 11/30/2021  2:26 AM      Failed - Valid encounter within last 6 months    Recent Outpatient Visits           11 months ago Type 2 diabetes with nephropathy ([place])   [place], [name], DO   1 year ago Seasonal allergic rhinitis due to pollen   [facility], [name], DO   1 year ago Type 2 diabetes with nephropathy [facility])   [place], [name], DO   2 years ago Type 2 diabetes with nephropathy [facility])   [place], [place], DO   2 years ago Routine general medical examination at a health care facility   [facility], [place] P, DO              Passed - Last BP in normal range    BP Readings from Last 1 Encounters:  12/28/20 136/80         Passed - Last Heart Rate in normal range    Pulse Readings from Last 1 Encounters:  12/28/20 79

## 2021-12-02 ENCOUNTER — Ambulatory Visit: Payer: Self-pay | Admitting: Family Medicine

## 2021-12-02 MED ORDER — HYDROCHLOROTHIAZIDE 25 MG PO TABS: 25.0000 mg | ORAL_TABLET | Freq: Every day | ORAL | 0 refills | Status: DC

## 2021-12-02 MED ORDER — AMLODIPINE BESYLATE 10 MG PO TABS: 10.0000 mg | ORAL_TABLET | Freq: Every day | ORAL | 0 refills | Status: DC

## 2021-12-02 MED ORDER — METOPROLOL TARTRATE 25 MG PO TABS: 25.0000 mg | ORAL_TABLET | Freq: Two times a day (BID) | ORAL | 0 refills | Status: DC

## 2021-12-02 MED ORDER — HYDRALAZINE HCL 100 MG PO TABS: 100.0000 mg | ORAL_TABLET | Freq: Two times a day (BID) | ORAL | 0 refills | Status: DC

## 2021-12-02 NOTE — Telephone Encounter (Signed)
Requested medication (s) are due for refill today: yes  Requested medication (s) are on the active medication list: yes  Last refill:  Irbesartan 06/16/21 #90/0, metformin 03/15/21 #360/0 / Future visit scheduled: yes 12/08/21  Notes to clinic:  Unable to refill per protocol due to failed labs, no updated results.      Requested Prescriptions  Pending Prescriptions Disp Refills   irbesartan (AVAPRO) 300 MG tablet 90 tablet 0    Sig: Take 1 tablet (300 mg total) by mouth daily.     Cardiovascular:  Angiotensin Receptor Blockers Failed - 12/02/2021  5:29 PM      Failed - Cr in normal range and within 180 days    Creatinine, Ser  Date Value Ref Range Status  12/28/2020 1.70 (H) 0.76 - 1.27 mg/dL Final         Failed - K in normal range and within 180 days    Potassium  Date Value Ref Range Status  12/28/2020 4.1 3.5 - 5.2 mmol/L Final         Failed - Valid encounter within last 6 months    Recent Outpatient Visits           11 months ago Type 2 diabetes with nephropathy [facility])   Gregory, [name], DO   1 year ago Seasonal allergic rhinitis due to pollen   [facility], [name], DO   1 year ago Type 2 diabetes with nephropathy [facility])   [place], [name], DO   2 years ago Type 2 diabetes with nephropathy [facility])   [place], Parker, DO   2 years ago Routine general medical examination at a health care facility   [place], [place], DO       Future Appointments             In 6 days Wynetta Emery, Barb Merino, DO [place], [place] - Patient is not pregnant      Passed - Last BP in normal range    BP Readings from Last 1 Encounters:  12/28/20 136/80          metFORMIN (GLUCOPHAGE-XR) 500 MG 24 hr tablet 360 tablet 0    Sig: Take 2 tablets (1,000 mg total) by mouth 2 (two) times daily.     Endocrinology:  Diabetes - Biguanides  Failed - 12/02/2021  5:29 PM      Failed - Cr in normal range and within 360 days    Creatinine, Ser  Date Value Ref Range Status  12/28/2020 1.70 (H) 0.76 - 1.27 mg/dL Final         Failed - HBA1C is between 0 and 7.9 and within 180 days    HB A1C (BAYER DCA - WAIVED)  Date Value Ref Range Status  12/28/2020 7.0 (H) 4.8 - 5.6 % Final    Comment:             Prediabetes: 5.7 - 6.4          Diabetes: >6.4          Glycemic control for adults with diabetes: <7.0          Failed - eGFR in normal range and within 360 days    GFR calc Af Amer  Date Value Ref Range Status  02/16/2020 38 (L) >59 mL/min/1.73 Final    Comment:    **  In accordance with recommendations from the NKF-ASN Task force,**   [facility] is in the process of updating its eGFR calculation to the   2021 CKD-EPI creatinine equation that estimates kidney function   without a race variable.    GFR calc non Af Amer  Date Value Ref Range Status  02/16/2020 33 (L) >59 mL/min/1.73 Final   eGFR  Date Value Ref Range Status  12/28/2020 46 (L) >59 mL/min/1.73 Final         Failed - B12 Level in normal range and within 720 days    No results found for: "VITAMINB12"       Failed - Valid encounter within last 6 months    Recent Outpatient Visits           11 months ago Type 2 diabetes with nephropathy ([place])   [place], [place], DO   1 year ago Seasonal allergic rhinitis due to pollen   [facility], [name], DO   1 year ago Type 2 diabetes with nephropathy ([place])   [place], [name], DO   2 years ago Type 2 diabetes with nephropathy ([place])   [place], [name], DO   2 years ago Routine general medical examination at a health care facility   [place], [place], DO       Future Appointments             In 6 days Wynetta Emery, Barb Merino, DO [place], [facility]            Passed - CBC within  normal limits and completed in the last 12 months    WBC  Date Value Ref Range Status  12/28/2020 7.0 3.4 - 10.8 x10E3/uL Final  02/15/2018 6.0 4.0 - 10.5 K/uL Final   RBC  Date Value Ref Range Status  12/28/2020 5.24 4.14 - 5.80 x10E6/uL Final  02/15/2018 4.44 4.22 - 5.81 MIL/uL Final   Hemoglobin  Date Value Ref Range Status  12/28/2020 14.2 13.0 - 17.7 g/dL Final   Hematocrit  Date Value Ref Range Status  12/28/2020 43.2 37.5 - 51.0 % Final   MCHC  Date Value Ref Range Status  12/28/2020 32.9 31.5 - 35.7 g/dL Final  02/15/2018 32.6 30.0 - 36.0 g/dL Final   [facility]  Date Value Ref Range Status  12/28/2020 27.1 26.6 - 33.0 pg Final  02/15/2018 27.3 26.0 - 34.0 pg Final   MCV  Date Value Ref Range Status  12/28/2020 82 79 - 97 fL Final   No results found for: "PLTCOUNTKUC", "LABPLAT", "POCPLA" RDW  Date Value Ref Range Status  12/28/2020 13.8 11.6 - 15.4 % Final         Signed Prescriptions Disp Refills   amLODipine (NORVASC) 10 MG tablet 30 tablet 0    Sig: Take 1 tablet (10 mg total) by mouth daily.     Cardiovascular: Calcium Channel Blockers 2 Failed - 12/02/2021  5:12 PM      Failed - Valid encounter within last 6 months    Recent Outpatient Visits           11 months ago Type 2 diabetes with nephropathy [facility])   [place], [place], DO   1 year ago Seasonal allergic rhinitis due to pollen   [facility], [name], DO   1 year ago Type 2 diabetes with nephropathy [facility])   [place],  [name], DO   2 years ago Type 2 diabetes with nephropathy ([place])   [place], [place], DO   2 years ago Routine general medical examination at a health care facility   [place], [place], DO       Future Appointments             In 6 days Wynetta Emery, [name], DO [place], [facility]            Passed - Last BP in normal range    BP Readings from Last 1  Encounters:  12/28/20 136/80         Passed - Last Heart Rate in normal range    Pulse Readings from Last 1 Encounters:  12/28/20 79          metoprolol tartrate (LOPRESSOR) 25 MG tablet 60 tablet 0    Sig: Take 1 tablet (25 mg total) by mouth 2 (two) times daily.     Cardiovascular:  Beta Blockers Failed - 12/02/2021  5:29 PM      Failed - Valid encounter within last 6 months    Recent Outpatient Visits           11 months ago Type 2 diabetes with nephropathy [facility])   [place], [name], DO   1 year ago Seasonal allergic rhinitis due to pollen   [facility], [name], DO   1 year ago Type 2 diabetes with nephropathy [facility])   [place], [name], DO   2 years ago Type 2 diabetes with nephropathy ([place])   [place], [name], DO   2 years ago Routine general medical examination at a health care facility   [facility], [place], DO       Future Appointments             In 6 days Wynetta Emery, [name], DO [place], [facility]            Passed - Last BP in normal range    BP Readings from Last 1 Encounters:  12/28/20 136/80         Passed - Last Heart Rate in normal range    Pulse Readings from Last 1 Encounters:  12/28/20 79          hydrALAZINE (APRESOLINE) 100 MG tablet 60 tablet 0    Sig: Take 1 tablet (100 mg total) by mouth 2 (two) times daily.     Cardiovascular:  Vasodilators Failed - 12/02/2021  5:29 PM      Failed - ANA Screen, Ifa, Serum in normal range and within 360 days    No results found for: "ANA", "ANATITER", "LABANTI"       Passed - HCT in normal range and within 360 days    Hematocrit  Date Value Ref Range Status  12/28/2020 43.2 37.5 - 51.0 % Final         Passed - HGB in normal range and within 360 days    Hemoglobin  Date Value Ref Range Status  12/28/2020 14.2 13.0 - 17.7 g/dL Final         Passed - RBC in normal range  and within 360 days    RBC  Date Value Ref Range Status  12/28/2020 5.24 4.14 - 5.80 x10E6/uL Final  02/15/2018 4.44 4.22 - 5.81 MIL/uL Final  Passed - WBC in normal range and within 360 days    WBC  Date Value Ref Range Status  12/28/2020 7.0 3.4 - 10.8 x10E3/uL Final  02/15/2018 6.0 4.0 - 10.5 K/uL Final         Passed - PLT in normal range and within 360 days    Platelets  Date Value Ref Range Status  12/28/2020 265 150 - 450 x10E3/uL Final         Passed - Last BP in normal range    BP Readings from Last 1 Encounters:  12/28/20 136/80         Passed - Valid encounter within last 12 months    Recent Outpatient Visits           11 months ago Type 2 diabetes with nephropathy ([place])   [place], [name], DO   1 year ago Seasonal allergic rhinitis due to pollen   [facility], [name], DO   1 year ago Type 2 diabetes with nephropathy [facility])   [place], [name], DO   2 years ago Type 2 diabetes with nephropathy ([place])   [place], [name], DO   2 years ago Routine general medical examination at a health care facility   [facility], [place], DO       Future Appointments             In 6 days Wynetta Emery, Barb Merino, DO [place], [facility]             hydrochlorothiazide (HYDRODIURIL) 25 MG tablet 30 tablet 0    Sig: Take 1 tablet (25 mg total) by mouth daily.     Cardiovascular: Diuretics - Thiazide Failed - 12/02/2021  5:29 PM      Failed - Cr in normal range and within 180 days    Creatinine, Ser  Date Value Ref Range Status  12/28/2020 1.70 (H) 0.76 - 1.27 mg/dL Final         Failed - K in normal range and within 180 days    Potassium  Date Value Ref Range Status  12/28/2020 4.1 3.5 - 5.2 mmol/L Final         Failed - Na in normal range and within 180 days    Sodium  Date Value Ref Range Status  12/28/2020 140 134 - 144 mmol/L  Final         Failed - Valid encounter within last 6 months    Recent Outpatient Visits           11 months ago Type 2 diabetes with nephropathy ([place])   [place], [name], DO   1 year ago Seasonal allergic rhinitis due to pollen   [facility], [name], DO   1 year ago Type 2 diabetes with nephropathy [facility])   [place], [name], DO   2 years ago Type 2 diabetes with nephropathy ([place])   [place], [name], DO   2 years ago Routine general medical examination at a health care facility   [place], [place], DO       Future Appointments             In 6 days Wynetta Emery, [name], DO [place], [facility]            Passed - Last BP in normal range  BP Readings from Last 1 Encounters:  12/28/20 136/80         Refused Prescriptions Disp Refills   amLODipine (NORVASC) 10 MG tablet [Pharmacy Med Name: AMLODIPINE BESYLATE 10 MG TAB] 30 tablet 5    Sig: TAKE 1 TABLET BY MOUTH EVERY DAY     Cardiovascular: Calcium Channel Blockers 2 Failed - 12/02/2021  5:29 PM      Failed - Valid encounter within last 6 months    Recent Outpatient Visits           11 months ago Type 2 diabetes with nephropathy ([place])   [place], [place] P, DO   1 year ago Seasonal allergic rhinitis due to pollen   [facility], [name], DO   1 year ago Type 2 diabetes with nephropathy [facility])   [place], [name], DO   2 years ago Type 2 diabetes with nephropathy ([place])   [place], [name], DO   2 years ago Routine general medical examination at a health care facility   [facility], [place], DO       Future Appointments             In 6 days Wynetta Emery, [name], DO [place], [facility]            Passed - Last BP in normal range    BP Readings from Last 1  Encounters:  12/28/20 136/80         Passed - Last Heart Rate in normal range    Pulse Readings from Last 1 Encounters:  12/28/20 79

## 2021-12-02 NOTE — Addendum Note (Signed)
Addended by: Erie Noe on: 12/02/2021 05:29 PM   Modules accepted: Orders

## 2021-12-02 NOTE — Addendum Note (Signed)
Addended by: Erie Noe on: 12/02/2021 05:12 PM   Modules accepted: Orders

## 2021-12-02 NOTE — Telephone Encounter (Signed)
Courtesy refill, spoke with wife regarding pt needing appt. Scheduled appt for 12/08/21 at 1600. Pt had appt today and was no show d/t being unable to get off work.   Requested Prescriptions  Pending Prescriptions Disp Refills   metoprolol tartrate (LOPRESSOR) 25 MG tablet 60 tablet 0    Sig: Take 1 tablet (25 mg total) by mouth 2 (two) times daily.     Cardiovascular:  Beta Blockers Failed - 12/02/2021  5:29 PM      Failed - Valid encounter within last 6 months    Recent Outpatient Visits           11 months ago Type 2 diabetes with nephropathy [facility])   [place], [name], DO   1 year ago Seasonal allergic rhinitis due to pollen   [facility], [name], DO   1 year ago Type 2 diabetes with nephropathy [facility])   [place], [name], DO   2 years ago Type 2 diabetes with nephropathy ([place])   [place], [name], DO   2 years ago Routine general medical examination at a health care facility   [facility], [place], DO       Future Appointments             In 6 days Wynetta Emery, [name], DO [place], [facility]            Passed - Last BP in normal range    BP Readings from Last 1 Encounters:  12/28/20 136/80         Passed - Last Heart Rate in normal range    Pulse Readings from Last 1 Encounters:  12/28/20 79          hydrALAZINE (APRESOLINE) 100 MG tablet 60 tablet 0    Sig: Take 1 tablet (100 mg total) by mouth 2 (two) times daily.     Cardiovascular:  Vasodilators Failed - 12/02/2021  5:29 PM      Failed - ANA Screen, Ifa, Serum in normal range and within 360 days    No results found for: "ANA", "ANATITER", "LABANTI"       Passed - HCT in normal range and within 360 days    Hematocrit  Date Value Ref Range Status  12/28/2020 43.2 37.5 - 51.0 % Final         Passed - HGB in normal range and within 360 days    Hemoglobin  Date Value Ref Range Status   12/28/2020 14.2 13.0 - 17.7 g/dL Final         Passed - RBC in normal range and within 360 days    RBC  Date Value Ref Range Status  12/28/2020 5.24 4.14 - 5.80 x10E6/uL Final  02/15/2018 4.44 4.22 - 5.81 MIL/uL Final         Passed - WBC in normal range and within 360 days    WBC  Date Value Ref Range Status  12/28/2020 7.0 3.4 - 10.8 x10E3/uL Final  02/15/2018 6.0 4.0 - 10.5 K/uL Final         Passed - PLT in normal range and within 360 days    Platelets  Date Value Ref Range Status  12/28/2020 265 150 - 450 x10E3/uL Final         Passed - Last BP in normal range    BP Readings from Last 1 Encounters:  12/28/20 136/80  Passed - Valid encounter within last 12 months    Recent Outpatient Visits           11 months ago Type 2 diabetes with nephropathy ([place])   [place], [place] P, DO   1 year ago Seasonal allergic rhinitis due to pollen   [facility], [name], DO   1 year ago Type 2 diabetes with nephropathy [facility])   [place], [name], DO   2 years ago Type 2 diabetes with nephropathy [facility])   [place], [name], DO   2 years ago Routine general medical examination at a health care facility   [facility], [place], DO       Future Appointments             In 6 days Wynetta Emery, Barb Merino, DO [place], [facility]             hydrochlorothiazide (HYDRODIURIL) 25 MG tablet 30 tablet 0    Sig: Take 1 tablet (25 mg total) by mouth daily.     Cardiovascular: Diuretics - Thiazide Failed - 12/02/2021  5:29 PM      Failed - Cr in normal range and within 180 days    Creatinine, Ser  Date Value Ref Range Status  12/28/2020 1.70 (H) 0.76 - 1.27 mg/dL Final         Failed - K in normal range and within 180 days    Potassium  Date Value Ref Range Status  12/28/2020 4.1 3.5 - 5.2 mmol/L Final         Failed - Na in normal range and within 180  days    Sodium  Date Value Ref Range Status  12/28/2020 140 134 - 144 mmol/L Final         Failed - Valid encounter within last 6 months    Recent Outpatient Visits           11 months ago Type 2 diabetes with nephropathy ([place])   [place], [name], DO   1 year ago Seasonal allergic rhinitis due to pollen   [facility], [name], DO   1 year ago Type 2 diabetes with nephropathy [facility])   [place], [name], DO   2 years ago Type 2 diabetes with nephropathy [facility])   [place], [name], DO   2 years ago Routine general medical examination at a health care facility   [facility], [place], DO       Future Appointments             In 6 days Wynetta Emery, [name], DO [place], [facility]            Passed - Last BP in normal range    BP Readings from Last 1 Encounters:  12/28/20 136/80          irbesartan (AVAPRO) 300 MG tablet 90 tablet 0    Sig: Take 1 tablet (300 mg total) by mouth daily.     Cardiovascular:  Angiotensin Receptor Blockers Failed - 12/02/2021  5:29 PM      Failed - Cr in normal range and within 180 days    Creatinine, Ser  Date Value Ref Range Status  12/28/2020 1.70 (H) 0.76 - 1.27 mg/dL Final         Failed - K in normal range and  within 180 days    Potassium  Date Value Ref Range Status  12/28/2020 4.1 3.5 - 5.2 mmol/L Final         Failed - Valid encounter within last 6 months    Recent Outpatient Visits           11 months ago Type 2 diabetes with nephropathy [facility])   [place], [name], DO   1 year ago Seasonal allergic rhinitis due to pollen   [facility], [name], DO   1 year ago Type 2 diabetes with nephropathy [facility])   [place], [name], DO   2 years ago Type 2 diabetes with nephropathy [facility])   [place], [name], DO   2 years ago  Routine general medical examination at a health care facility   [place], [place], DO       Future Appointments             In 6 days Wynetta Emery, Barb Merino, DO [place], [place] - Patient is not pregnant      Passed - Last BP in normal range    BP Readings from Last 1 Encounters:  12/28/20 136/80          metFORMIN (GLUCOPHAGE-XR) 500 MG 24 hr tablet 360 tablet 0    Sig: Take 2 tablets (1,000 mg total) by mouth 2 (two) times daily.     Endocrinology:  Diabetes - Biguanides Failed - 12/02/2021  5:29 PM      Failed - Cr in normal range and within 360 days    Creatinine, Ser  Date Value Ref Range Status  12/28/2020 1.70 (H) 0.76 - 1.27 mg/dL Final         Failed - HBA1C is between 0 and 7.9 and within 180 days    HB A1C (BAYER DCA - WAIVED)  Date Value Ref Range Status  12/28/2020 7.0 (H) 4.8 - 5.6 % Final    Comment:             Prediabetes: 5.7 - 6.4          Diabetes: >6.4          Glycemic control for adults with diabetes: <7.0          Failed - eGFR in normal range and within 360 days    GFR calc Af Amer  Date Value Ref Range Status  02/16/2020 38 (L) >59 mL/min/1.73 Final    Comment:    **In accordance with recommendations from the NKF-ASN Task force,**   [facility] is in the process of updating its eGFR calculation to the   2021 CKD-EPI creatinine equation that estimates kidney function   without a race variable.    GFR calc non Af Amer  Date Value Ref Range Status  02/16/2020 33 (L) >59 mL/min/1.73 Final   eGFR  Date Value Ref Range Status  12/28/2020 46 (L) >59 mL/min/1.73 Final         Failed - B12 Level in normal range and within 720 days    No results found for: "VITAMINB12"       Failed - Valid encounter within last 6 months    Recent Outpatient Visits           11 months ago Type 2 diabetes with nephropathy [facility])   [place], [name], DO  1 year ago Seasonal  allergic rhinitis due to pollen   [facility], [name], DO   1 year ago Type 2 diabetes with nephropathy [facility])   [place], [name], DO   2 years ago Type 2 diabetes with nephropathy ([place])   [place], [name], DO   2 years ago Routine general medical examination at a health care facility   [facility], [place], DO       Future Appointments             In 6 days Wynetta Emery, Barb Merino, DO [place], [facility]            Passed - CBC within normal limits and completed in the last 12 months    WBC  Date Value Ref Range Status  12/28/2020 7.0 3.4 - 10.8 x10E3/uL Final  02/15/2018 6.0 4.0 - 10.5 K/uL Final   RBC  Date Value Ref Range Status  12/28/2020 5.24 4.14 - 5.80 x10E6/uL Final  02/15/2018 4.44 4.22 - 5.81 MIL/uL Final   Hemoglobin  Date Value Ref Range Status  12/28/2020 14.2 13.0 - 17.7 g/dL Final   Hematocrit  Date Value Ref Range Status  12/28/2020 43.2 37.5 - 51.0 % Final   MCHC  Date Value Ref Range Status  12/28/2020 32.9 31.5 - 35.7 g/dL Final  02/15/2018 32.6 30.0 - 36.0 g/dL Final   [facility]  Date Value Ref Range Status  12/28/2020 27.1 26.6 - 33.0 pg Final  02/15/2018 27.3 26.0 - 34.0 pg Final   MCV  Date Value Ref Range Status  12/28/2020 82 79 - 97 fL Final   No results found for: "PLTCOUNTKUC", "LABPLAT", "POCPLA" RDW  Date Value Ref Range Status  12/28/2020 13.8 11.6 - 15.4 % Final         Signed Prescriptions Disp Refills   amLODipine (NORVASC) 10 MG tablet 30 tablet 0    Sig: Take 1 tablet (10 mg total) by mouth daily.     Cardiovascular: Calcium Channel Blockers 2 Failed - 12/02/2021  5:12 PM      Failed - Valid encounter within last 6 months    Recent Outpatient Visits           11 months ago Type 2 diabetes with nephropathy [facility])   [place], [place], DO   1 year ago Seasonal allergic rhinitis due to pollen    [facility], [name], DO   1 year ago Type 2 diabetes with nephropathy [facility])   [place], [name], DO   2 years ago Type 2 diabetes with nephropathy [facility])   [place], [name], DO   2 years ago Routine general medical examination at a health care facility   [facility], [place], DO       Future Appointments             In 6 days Wynetta Emery, [name], DO [place], [facility]            Passed - Last BP in normal range    BP Readings from Last 1 Encounters:  12/28/20 136/80         Passed - Last Heart Rate in normal range    Pulse Readings from Last 1 Encounters:  12/28/20 79         Refused Prescriptions Disp Refills   amLODipine (NORVASC) 10  MG tablet [Pharmacy Med Name: AMLODIPINE BESYLATE 10 MG TAB] 30 tablet 5    Sig: TAKE 1 TABLET BY MOUTH EVERY DAY     Cardiovascular: Calcium Channel Blockers 2 Failed - 12/02/2021  5:29 PM      Failed - Valid encounter within last 6 months    Recent Outpatient Visits           11 months ago Type 2 diabetes with nephropathy ([place])   [place], [place], DO   1 year ago Seasonal allergic rhinitis due to pollen   [facility], [name], DO   1 year ago Type 2 diabetes with nephropathy [facility])   [place], [name], DO   2 years ago Type 2 diabetes with nephropathy ([place])   [place], [name], DO   2 years ago Routine general medical examination at a health care facility   [facility], [place], DO       Future Appointments             In 6 days Wynetta Emery, [name], DO [place], [facility]            Passed - Last BP in normal range    BP Readings from Last 1 Encounters:  12/28/20 136/80         Passed - Last Heart Rate in normal range    Pulse Readings from Last 1 Encounters:  12/28/20 79

## 2021-12-02 NOTE — Telephone Encounter (Signed)
Caller states patient is unable to get off work and make his 4:20pm appointment for today with PCP. Caller states patient is out of his BP medication and his BP is running high. Offered patient the next available appointment and spouse stated patient needs the latest appointment, offered patient Thursday 12/08/2021 at [place] would like to know prior to scheduling will PCP send in a short supply until Thursday. Caller would like a follow up call today.    Please call back [phone]

## 2021-12-02 NOTE — Telephone Encounter (Signed)
Courtesy refill. Pt is going to schedule appt with Agent.   Requested Prescriptions  Pending Prescriptions Disp Refills   amLODipine (NORVASC) 10 MG tablet 30 tablet 0    Sig: Take 1 tablet (10 mg total) by mouth daily.     Cardiovascular: Calcium Channel Blockers 2 Failed - 12/02/2021  5:12 PM      Failed - Valid encounter within last 6 months    Recent Outpatient Visits           11 months ago Type 2 diabetes with nephropathy ([place])   [place], [place], DO   1 year ago Seasonal allergic rhinitis due to pollen   [facility] [place], [name], DO   1 year ago Type 2 diabetes with nephropathy [facility])   [place], [name], DO   2 years ago Type 2 diabetes with nephropathy [facility])   [place], [place], DO   2 years ago Routine general medical examination at a health care facility   [facility], [name], DO              Passed - Last BP in normal range    BP Readings from Last 1 Encounters:  12/28/20 136/80         Passed - Last Heart Rate in normal range    Pulse Readings from Last 1 Encounters:  12/28/20 79         Refused Prescriptions Disp Refills   amLODipine (NORVASC) 10 MG tablet [Pharmacy Med Name: AMLODIPINE BESYLATE 10 MG TAB] 30 tablet 5    Sig: TAKE 1 TABLET BY MOUTH EVERY DAY     Cardiovascular: Calcium Channel Blockers 2 Failed - 12/02/2021  5:12 PM      Failed - Valid encounter within last 6 months    Recent Outpatient Visits           11 months ago Type 2 diabetes with nephropathy ([place])   [place], [name], DO   1 year ago Seasonal allergic rhinitis due to pollen   [facility], [name], DO   1 year ago Type 2 diabetes with nephropathy [facility])   [place], [name], DO   2 years ago Type 2 diabetes with nephropathy [facility])   [place], Fran[place], DO   2 years ago  Routine general medical examination at a health care facility   [facility] [place] P, DO              Passed - Last BP in normal range    BP Readings from Last 1 Encounters:  12/28/20 136/80         Passed - Last Heart Rate in normal range    Pulse Readings from Last 1 Encounters:  12/28/20 79

## 2021-12-08 ENCOUNTER — Ambulatory Visit: Payer: Self-pay | Admitting: Family Medicine

## 2021-12-08 ENCOUNTER — Other Ambulatory Visit: Payer: Self-pay | Admitting: Family Medicine

## 2021-12-08 NOTE — Telephone Encounter (Signed)
Requested medications are due for refill today.  yes  Requested medications are on the active medications list.  yes  Last refill. 06/16/2021 #90 0 rf  Future visit scheduled.   no  Notes to clinic.  Pt more than 3 months overdue for ov.    Requested Prescriptions  Pending Prescriptions Disp Refills   irbesartan (AVAPRO) 300 MG tablet [Pharmacy Med Name: IRBESARTAN 300 MG TABLET] 7 tablet 12    Sig: TAKE 1 TABLET BY MOUTH EVERY DAY     Cardiovascular:  Angiotensin Receptor Blockers Failed - 12/08/2021  5:14 PM      Failed - Cr in normal range and within 180 days    Creatinine, Ser  Date Value Ref Range Status  12/28/2020 1.70 (H) 0.76 - 1.27 mg/dL Final         Failed - K in normal range and within 180 days    Potassium  Date Value Ref Range Status  12/28/2020 4.1 3.5 - 5.2 mmol/L Final         Failed - Valid encounter within last 6 months    Recent Outpatient Visits           11 months ago Type 2 diabetes with nephropathy ([place])   [place], [name], DO   1 year ago Seasonal allergic rhinitis due to pollen   [facility], [name], DO   1 year ago Type 2 diabetes with nephropathy [facility])   [place], [name], DO   2 years ago Type 2 diabetes with nephropathy [facility])   [place], [place], DO   2 years ago Routine general medical examination at a health care facility   [facility] [place], [place]              Passed - Patient is not pregnant      Passed - Last BP in normal range    BP Readings from Last 1 Encounters:  12/28/20 136/80

## 2021-12-11 ENCOUNTER — Other Ambulatory Visit: Payer: Self-pay | Admitting: Family Medicine

## 2021-12-12 NOTE — Telephone Encounter (Signed)
Requested Prescriptions  Pending Prescriptions Disp Refills   allopurinol (ZYLOPRIM) 100 MG tablet [Pharmacy Med Name: ALLOPURINOL 100 MG TABLET] 90 tablet 0    Sig: TAKE 1 TABLET BY [place] DAY     Endocrinology:  Gout Agents - allopurinol Failed - 12/11/2021  6:25 PM      Failed - Cr in normal range and within 360 days    Creatinine, Ser  Date Value Ref Range Status  12/28/2020 1.70 (H) 0.76 - 1.27 mg/dL Final         Passed - Uric Acid in normal range and within 360 days    Uric Acid  Date Value Ref Range Status  12/28/2020 8.3 3.8 - 8.4 mg/dL Final    Comment:               Therapeutic target for gout patients: <6.0         Passed - Valid encounter within last 12 months    Recent Outpatient Visits           11 months ago Type 2 diabetes with nephropathy ([place])   [place], [name], DO   1 year ago Seasonal allergic rhinitis due to pollen   [facility], [name], DO   1 year ago Type 2 diabetes with nephropathy ([place])   [place], [name], DO   2 years ago Type 2 diabetes with nephropathy ([place])   [place], Marissa, DO   2 years ago Routine general medical examination at a health care facility   [facility], [name], DO              Passed - CBC within normal limits and completed in the last 12 months    WBC  Date Value Ref Range Status  12/28/2020 7.0 3.4 - 10.8 x10E3/uL Final  02/15/2018 6.0 4.0 - 10.5 K/uL Final   RBC  Date Value Ref Range Status  12/28/2020 5.24 4.14 - 5.80 x10E6/uL Final  02/15/2018 4.44 4.22 - 5.81 MIL/uL Final   Hemoglobin  Date Value Ref Range Status  12/28/2020 14.2 13.0 - 17.7 g/dL Final   Hematocrit  Date Value Ref Range Status  12/28/2020 43.2 37.5 - 51.0 % Final   MCHC  Date Value Ref Range Status  12/28/2020 32.9 31.5 - 35.7 g/dL Final  02/15/2018 32.6 30.0 - 36.0 g/dL Final   [facility]  Date Value Ref Range  Status  12/28/2020 27.1 26.6 - 33.0 pg Final  02/15/2018 27.3 26.0 - 34.0 pg Final   MCV  Date Value Ref Range Status  12/28/2020 82 79 - 97 fL Final   No results found for: "PLTCOUNTKUC", "LABPLAT", "POCPLA" RDW  Date Value Ref Range Status  12/28/2020 13.8 11.6 - 15.4 % Final

## 2021-12-23 ENCOUNTER — Telehealth: Payer: Self-pay

## 2021-12-23 NOTE — Telephone Encounter (Signed)
Copied from [place] [id number]. Topic: General - Other >> Dec 23, 2021 12:26 PM Everette C wrote: Reason for CRM: The patient's wife has called to provide new insurance information for the patient  Medicare [id number] T  Please contact further if needed

## 2021-12-26 NOTE — Telephone Encounter (Signed)
Insurance information has been updated.

## 2022-01-05 ENCOUNTER — Encounter: Payer: Self-pay | Admitting: Family Medicine

## 2022-01-05 ENCOUNTER — Ambulatory Visit (INDEPENDENT_AMBULATORY_CARE_PROVIDER_SITE_OTHER): Payer: Medicaid Other | Admitting: Family Medicine

## 2022-01-05 VITALS — BP 137/81 | HR 82 | Temp 98.6°F | Ht 70.75 in | Wt 224.1 lb

## 2022-01-05 DIAGNOSIS — N183 Chronic kidney disease, stage 3 unspecified: Secondary | ICD-10-CM | POA: Diagnosis not present

## 2022-01-05 DIAGNOSIS — Z0000 Encounter for general adult medical examination without abnormal findings: Secondary | ICD-10-CM | POA: Diagnosis not present

## 2022-01-05 DIAGNOSIS — E782 Mixed hyperlipidemia: Secondary | ICD-10-CM

## 2022-01-05 DIAGNOSIS — E1121 Type 2 diabetes mellitus with diabetic nephropathy: Secondary | ICD-10-CM | POA: Diagnosis not present

## 2022-01-05 DIAGNOSIS — I1310 Hypertensive heart and chronic kidney disease without heart failure, with stage 1 through stage 4 chronic kidney disease, or unspecified chronic kidney disease: Secondary | ICD-10-CM | POA: Diagnosis not present

## 2022-01-05 MED ORDER — IRBESARTAN 300 MG PO TABS: 300.0000 mg | ORAL_TABLET | Freq: Every day | ORAL | 0 refills | Status: DC

## 2022-01-05 MED ORDER — ATORVASTATIN CALCIUM 80 MG PO TABS: 80.0000 mg | ORAL_TABLET | Freq: Every day | ORAL | 0 refills | Status: DC

## 2022-01-05 MED ORDER — HYDRALAZINE HCL 100 MG PO TABS: 100.0000 mg | ORAL_TABLET | Freq: Two times a day (BID) | ORAL | 0 refills | Status: DC

## 2022-01-05 MED ORDER — EMPAGLIFLOZIN 25 MG PO TABS: 25.0000 mg | ORAL_TABLET | Freq: Every day | ORAL | 0 refills | Status: DC

## 2022-01-05 MED ORDER — METOPROLOL TARTRATE 25 MG PO TABS: 25.0000 mg | ORAL_TABLET | Freq: Two times a day (BID) | ORAL | 0 refills | Status: DC

## 2022-01-05 MED ORDER — AMLODIPINE BESYLATE 10 MG PO TABS: 10.0000 mg | ORAL_TABLET | Freq: Every day | ORAL | 0 refills | Status: DC

## 2022-01-05 MED ORDER — HYDROCHLOROTHIAZIDE 25 MG PO TABS: 25.0000 mg | ORAL_TABLET | Freq: Every day | ORAL | 0 refills | Status: DC

## 2022-01-05 MED ORDER — SILDENAFIL CITRATE 100 MG PO TABS: 50.0000 mg | ORAL_TABLET | Freq: Every day | ORAL | 11 refills | Status: AC | PRN

## 2022-01-05 MED ORDER — METFORMIN HCL ER 500 MG PO TB24: 1000.0000 mg | ORAL_TABLET | Freq: Two times a day (BID) | ORAL | 0 refills | Status: DC

## 2022-01-05 MED ORDER — ALLOPURINOL 100 MG PO TABS: 100.0000 mg | ORAL_TABLET | Freq: Every day | ORAL | 0 refills | Status: DC

## 2022-01-05 NOTE — Progress Notes (Signed)
BP 137/81 (BP Location: Left Arm, Cuff Size: Normal)   Pulse 82   Temp 98.6 F (37 C) (Oral)   Ht 5' 10.75" (1.797 m)   Wt 224 lb 1.6 oz (101.7 kg)   SpO2 97%   BMI 31.48 kg/m    Subjective:    Patient ID: Anthony Ouch., male    DOB: 04-20-1960, 61 y.o.   MRN: [id number]  HPI: Anthony Sellers. is a 61 y.o. male presenting on 01/05/2022 for comprehensive medical examination. Current medical complaints include: none  He currently lives with: wife Interim Problems from his last visit: no  Depression Screen done today and results listed below:     01/06/2022    8:55 AM 12/28/2020   10:39 AM 08/14/2019    4:41 PM 08/13/2018    9:46 AM 02/21/2018    1:44 PM  Depression screen PHQ 2/9  Decreased Interest 0 0 0 0 0  Down, Depressed, Hopeless 0 0 0 0 0  PHQ - 2 Score 0 0 0 0 0  Altered sleeping 0 0 0 0   Tired, decreased energy 0 0 0 0   Change in appetite 0 0 0 0   Feeling bad or failure about yourself  0 0 0 0   Trouble concentrating 0 0 0 0   Moving slowly or fidgety/restless 0 0 0 0   Suicidal thoughts 0 0 0 0   PHQ-9 Score 0 0 0 0   Difficult doing work/chores Not difficult at all  Not difficult at all Not difficult at all     Past Medical History:  Past Medical History:  Diagnosis Date   Diabetes mellitus without complication ([place])    Enlarged prostate    Gout    Hyperlipidemia    Hypertension    IFG (impaired fasting glucose)    Vitamin D deficiency     Surgical History:  Past Surgical History:  Procedure Laterality Date   COLONOSCOPY WITH PROPOFOL N/A 10/18/2018   Procedure: COLONOSCOPY WITH PROPOFOL;  Surgeon: [name], MD;  Location: [facility] ENDOSCOPY;  Service: Gastroenterology;  Laterality: N/A;    Medications:  Current Outpatient Medications on File Prior to Visit  Medication Sig   ACCU-CHEK FASTCLIX LANCETS MISC 1 each by Does not apply route QID.   aspirin (ASPIRIN LOW DOSE) 81 MG EC tablet Take 1 tablet (81 mg total) by mouth daily.  Swallow whole.   blood glucose meter kit and supplies KIT Dispense based on patient and insurance preference. Use up to four times daily as directed. (FOR ICD-9 250.00, 250.01).   Blood Glucose Monitoring Suppl (ACCU-CHEK GUIDE) w/Device KIT 1 each by Does not apply route QID.   levocetirizine (XYZAL) 5 MG tablet Take 1 tablet (5 mg total) by mouth every evening.   Misc Natural Products (TART CHERRY ADVANCED PO) Take by mouth.   No current facility-administered medications on file prior to visit.    Allergies:  No Known Allergies  Social History:  Social History   Socioeconomic History   Marital status: Married    Spouse name: Not on file   Number of children: Not on file   Years of education: Not on file   Highest education level: Not on file  Occupational History   Not on file  Tobacco Use   Smoking status: Former    Packs/day: 0.25    Years: 8.00    Total pack years: 2.00    Types: Cigarettes    Quit  date: 01/23/2014    Years since quitting: 7.9   Smokeless tobacco: Never  Vaping Use   Vaping Use: Never used  Substance and Sexual Activity   Alcohol use: Yes    Alcohol/week: 0.0 standard drinks of alcohol    Comment: on occasion   Drug use: No   Sexual activity: Never  Other Topics Concern   Not on file  Social History Narrative   Not on file   Social Determinants of Health   Financial Resource Strain: Not on file  Food Insecurity: Not on file  Transportation Needs: Not on file  Physical Activity: Not on file  Stress: Not on file  Social Connections: Not on file  Intimate Partner Violence: Not on file   Social History   Tobacco Use  Smoking Status Former   Packs/day: 0.25   Years: 8.00   Total pack years: 2.00   Types: Cigarettes   Quit date: 01/23/2014   Years since quitting: 7.9  Smokeless Tobacco Never   Social History   Substance and Sexual Activity  Alcohol Use Yes   Alcohol/week: 0.0 standard drinks of alcohol   Comment: on occasion     Family History:  Family History  Problem Relation Age of Onset   Hypertension Mother    Diabetes Mother    Alzheimer's disease Father    Diabetes Sister    Hypertension Brother    Cancer Maternal Grandfather        Prostate   Cancer Sister        Breast Cancer    Past medical history, surgical history, medications, allergies, family history and social history reviewed with patient today and changes made to appropriate areas of the chart.   Review of Systems  Constitutional: Negative.   HENT: Negative.    Eyes: Negative.   Respiratory: Negative.    Cardiovascular: Negative.   Gastrointestinal: Negative.   Genitourinary: Negative.   Musculoskeletal: Negative.   Skin: Negative.   Neurological: Negative.   Endo/Heme/Allergies: Negative.   Psychiatric/Behavioral: Negative.     All other ROS negative except what is listed above and in the HPI.      Objective:    BP 137/81 (BP Location: Left Arm, Cuff Size: Normal)   Pulse 82   Temp 98.6 F (37 C) (Oral)   Ht 5' 10.75" (1.797 m)   Wt 224 lb 1.6 oz (101.7 kg)   SpO2 97%   BMI 31.48 kg/m   Wt Readings from Last 3 Encounters:  01/05/22 224 lb 1.6 oz (101.7 kg)  02/16/20 219 lb 9.6 oz (99.6 kg)  11/13/19 214 lb (97.1 kg)    Physical Exam Vitals and nursing note reviewed.  Constitutional:      General: He is not in acute distress.    Appearance: Normal appearance. He is obese. He is not ill-appearing, toxic-appearing or diaphoretic.  HENT:     Head: Normocephalic and atraumatic.     Right Ear: Tympanic membrane, ear canal and external ear normal. There is no impacted cerumen.     Left Ear: Tympanic membrane, ear canal and external ear normal. There is no impacted cerumen.     Nose: Nose normal. No congestion or rhinorrhea.     Mouth/Throat:     Mouth: Mucous membranes are moist.     Pharynx: Oropharynx is clear. No oropharyngeal exudate or posterior oropharyngeal erythema.  Eyes:     General: No scleral  icterus.       Right eye: No discharge.  Left eye: No discharge.     Extraocular Movements: Extraocular movements intact.     Conjunctiva/sclera: Conjunctivae normal.     Pupils: Pupils are equal, round, and reactive to light.  Neck:     Vascular: No carotid bruit.  Cardiovascular:     Rate and Rhythm: Normal rate and regular rhythm.     Pulses: Normal pulses.     Heart sounds: No murmur heard.    No friction rub. No gallop.  Pulmonary:     Effort: Pulmonary effort is normal. No respiratory distress.     Breath sounds: Normal breath sounds. No stridor. No wheezing, rhonchi or rales.  Chest:     Chest wall: No tenderness.  Abdominal:     General: Abdomen is flat. Bowel sounds are normal. There is no distension.     Palpations: Abdomen is soft. There is no mass.     Tenderness: There is no abdominal tenderness. There is no right CVA tenderness, left CVA tenderness, guarding or rebound.     Hernia: No hernia is present.  Genitourinary:    Comments: Genital exam deferred with shared decision making Musculoskeletal:        General: No swelling, tenderness, deformity or signs of injury.     Cervical back: Normal range of motion and neck supple. No rigidity. No muscular tenderness.     Right lower leg: No edema.     Left lower leg: No edema.  Lymphadenopathy:     Cervical: No cervical adenopathy.  Skin:    General: Skin is warm and dry.     Capillary Refill: Capillary refill takes less than 2 seconds.     Coloration: Skin is not jaundiced or pale.     Findings: No bruising, erythema, lesion or rash.  Neurological:     General: No focal deficit present.     Mental Status: He is alert and oriented to person, place, and time.     Cranial Nerves: No cranial nerve deficit.     Sensory: No sensory deficit.     Motor: No weakness.     Coordination: Coordination normal.     Gait: Gait normal.     Deep Tendon Reflexes: Reflexes normal.  Psychiatric:        Mood and Affect: Mood  normal.        Behavior: Behavior normal.        Thought Content: Thought content normal.        Judgment: Judgment normal.     Results for orders placed or performed in visit on 01/05/22  Comprehensive metabolic panel  Result Value Ref Range   Glucose 85 70 - 99 mg/dL   BUN 30 (H) 8 - 27 mg/dL   Creatinine, Ser 2.15 (H) 0.76 - 1.27 mg/dL   eGFR 34 (L) >59 mL/min/1.73   BUN/Creatinine Ratio 14 10 - 24   Sodium 141 134 - 144 mmol/L   Potassium 3.5 3.5 - 5.2 mmol/L   Chloride 102 96 - 106 mmol/L   CO2 23 20 - 29 mmol/L   Calcium 9.6 8.6 - 10.2 mg/dL   Total Protein 6.7 6.0 - 8.5 g/dL   Albumin 4.3 3.8 - 4.9 g/dL   Globulin, Total 2.4 1.5 - 4.5 g/dL   Albumin/Globulin Ratio 1.8 1.2 - 2.2   Bilirubin Total 0.4 0.0 - 1.2 mg/dL   Alkaline Phosphatase 84 44 - 121 IU/L   AST 21 0 - 40 IU/L   ALT 16 0 - 44 IU/L  CBC with Differential/Platelet  Result Value Ref Range   WBC 7.7 3.4 - 10.8 x10E3/uL   RBC 4.84 4.14 - 5.80 x10E6/uL   Hemoglobin 13.2 13.0 - 17.7 g/dL   Hematocrit 40.0 37.5 - 51.0 %   MCV 83 79 - 97 fL   MCH 27.3 26.6 - 33.0 pg   MCHC 33.0 31.5 - 35.7 g/dL   RDW 14.0 11.6 - 15.4 %   Platelets 302 150 - 450 x10E3/uL   Neutrophils 66 Not Estab. %   Lymphs 21 Not Estab. %   Monocytes 8 Not Estab. %   Eos 4 Not Estab. %   Basos 1 Not Estab. %   Neutrophils Absolute 5.1 1.4 - 7.0 x10E3/uL   Lymphocytes Absolute 1.6 0.7 - 3.1 x10E3/uL   Monocytes Absolute 0.6 0.1 - 0.9 x10E3/uL   EOS (ABSOLUTE) 0.3 0.0 - 0.4 x10E3/uL   Basophils Absolute 0.1 0.0 - 0.2 x10E3/uL   Immature Granulocytes 0 Not Estab. %   Immature Grans (Abs) 0.0 0.0 - 0.1 x10E3/uL  Lipid Panel w/o Chol/HDL Ratio  Result Value Ref Range   Cholesterol, Total 165 100 - 199 mg/dL   Triglycerides 145 0 - 149 mg/dL   HDL 37 (L) >39 mg/dL   VLDL Cholesterol Cal 26 5 - 40 mg/dL   LDL Chol Calc (NIH) 102 (H) 0 - 99 mg/dL  PSA  Result Value Ref Range   Prostate Specific Ag, Serum 2.2 0.0 - 4.0 ng/mL  TSH   Result Value Ref Range   TSH 1.710 0.450 - 4.500 uIU/mL      Assessment & Plan:   Problem List Items Addressed This Visit       Cardiovascular and Mediastinum   Hypertensive heart/kidney disease without HF and with CKD stage III ([place])    Rechecking labs today. Await results. Refills given.       Relevant Medications   amLODipine (NORVASC) 10 MG tablet   atorvastatin (LIPITOR) 80 MG tablet   hydrALAZINE (APRESOLINE) 100 MG tablet   hydrochlorothiazide (HYDRODIURIL) 25 MG tablet   irbesartan (AVAPRO) 300 MG tablet   metoprolol tartrate (LOPRESSOR) 25 MG tablet   sildenafil (VIAGRA) 100 MG tablet     Endocrine   Type 2 diabetes with nephropathy (HCC)    Had been off his medicine until about a month ago. A1c elevated at 7.1. Will continue current regimen and recheck 3 motnhs. Call with any concerns.       Relevant Medications   atorvastatin (LIPITOR) 80 MG tablet   empagliflozin (JARDIANCE) 25 MG TABS tablet   irbesartan (AVAPRO) 300 MG tablet   metFORMIN (GLUCOPHAGE-XR) 500 MG 24 hr tablet     Other   Hyperlipidemia    Rechecking labs today. Await results. Refills given.       Relevant Medications   amLODipine (NORVASC) 10 MG tablet   atorvastatin (LIPITOR) 80 MG tablet   hydrALAZINE (APRESOLINE) 100 MG tablet   hydrochlorothiazide (HYDRODIURIL) 25 MG tablet   irbesartan (AVAPRO) 300 MG tablet   metoprolol tartrate (LOPRESSOR) 25 MG tablet   sildenafil (VIAGRA) 100 MG tablet   Other Visit Diagnoses     Routine general medical examination at a health care facility    -  Primary   Vaccines declined. Screening labs checked today. Colonoscopy up to date. Continue diet and exercise. Call with any concerns.   Relevant Orders   Bayer DCA Hb A1c Waived   Comprehensive metabolic panel (Completed)   CBC with Differential/Platelet (Completed)  Lipid Panel w/o Chol/HDL Ratio (Completed)   PSA (Completed)   TSH (Completed)   Urinalysis, Routine w reflex microscopic    Microalbumin, Urine Waived        LABORATORY TESTING:  Health maintenance labs ordered today as discussed above.   The natural history of prostate cancer and ongoing controversy regarding screening and potential treatment outcomes of prostate cancer has been discussed with the patient. The meaning of a false positive PSA and a false negative PSA has been discussed. He indicates understanding of the limitations of this screening test and wishes to proceed with screening PSA testing.   IMMUNIZATIONS:   - Tdap: Tetanus vaccination status reviewed: last tetanus booster within 10 years. - Influenza: Refused - Pneumovax: Refused - Prevnar: Refused - COVID: Refused - HPV: Not applicable - Shingrix vaccine: Refused  SCREENING: - Colonoscopy: Up to date  Discussed with patient purpose of the colonoscopy is to detect colon cancer at curable precancerous or early stages   PATIENT COUNSELING:    Sexuality: Discussed sexually transmitted diseases, partner selection, use of condoms, avoidance of unintended pregnancy  and contraceptive alternatives.   Advised to avoid cigarette smoking.  I discussed with the patient that most people either abstain from alcohol or drink within safe limits (<=14/week and <=4 drinks/occasion for males, <=7/weeks and <= 3 drinks/occasion for females) and that the risk for alcohol disorders and other health effects rises proportionally with the number of drinks per week and how often a drinker exceeds daily limits.  Discussed cessation/primary prevention of drug use and availability of treatment for abuse.   Diet: Encouraged to adjust caloric intake to maintain  or achieve ideal body weight, to reduce intake of dietary saturated fat and total fat, to limit sodium intake by avoiding high sodium foods and not adding table salt, and to maintain adequate dietary potassium and calcium preferably from fresh fruits, vegetables, and low-fat dairy products.    stressed the  importance of regular exercise  Injury prevention: Discussed safety belts, safety helmets, smoke detector, smoking near bedding or upholstery.   Dental health: Discussed importance of regular tooth brushing, flossing, and dental visits.   Follow up plan: NEXT PREVENTATIVE PHYSICAL DUE IN 1 YEAR. Return in about 3 months (around 04/06/2022).

## 2022-01-06 LAB — URINALYSIS, ROUTINE W REFLEX MICROSCOPIC
Bilirubin, UA: NEGATIVE
Glucose, UA: NEGATIVE
Ketones, UA: NEGATIVE
Leukocytes,UA: NEGATIVE
Nitrite, UA: NEGATIVE
RBC, UA: NEGATIVE
Specific Gravity, UA: 1.025 (ref 1.005–1.030)
Urobilinogen, Ur: 0.2 mg/dL (ref 0.2–1.0)
pH, UA: 5 (ref 5.0–7.5)

## 2022-01-06 LAB — CBC WITH DIFFERENTIAL/PLATELET
Basophils Absolute: 0.1 10*3/uL (ref 0.0–0.2)
Basos: 1 %
EOS (ABSOLUTE): 0.3 10*3/uL (ref 0.0–0.4)
Eos: 4 %
Hematocrit: 40 % (ref 37.5–51.0)
Hemoglobin: 13.2 g/dL (ref 13.0–17.7)
Immature Grans (Abs): 0 10*3/uL (ref 0.0–0.1)
Immature Granulocytes: 0 %
Lymphocytes Absolute: 1.6 10*3/uL (ref 0.7–3.1)
Lymphs: 21 %
MCH: 27.3 pg (ref 26.6–33.0)
MCHC: 33 g/dL (ref 31.5–35.7)
MCV: 83 fL (ref 79–97)
Monocytes Absolute: 0.6 10*3/uL (ref 0.1–0.9)
Monocytes: 8 %
Neutrophils Absolute: 5.1 10*3/uL (ref 1.4–7.0)
Neutrophils: 66 %
Platelets: 302 10*3/uL (ref 150–450)
RBC: 4.84 x10E6/uL (ref 4.14–5.80)
RDW: 14 % (ref 11.6–15.4)
WBC: 7.7 10*3/uL (ref 3.4–10.8)

## 2022-01-06 LAB — COMPREHENSIVE METABOLIC PANEL
ALT: 16 IU/L (ref 0–44)
AST: 21 IU/L (ref 0–40)
Albumin/Globulin Ratio: 1.8 (ref 1.2–2.2)
Albumin: 4.3 g/dL (ref 3.8–4.9)
Alkaline Phosphatase: 84 IU/L (ref 44–121)
BUN/Creatinine Ratio: 14 (ref 10–24)
BUN: 30 mg/dL — ABNORMAL HIGH (ref 8–27)
Bilirubin Total: 0.4 mg/dL (ref 0.0–1.2)
CO2: 23 mmol/L (ref 20–29)
Calcium: 9.6 mg/dL (ref 8.6–10.2)
Chloride: 102 mmol/L (ref 96–106)
Creatinine, Ser: 2.15 mg/dL — ABNORMAL HIGH (ref 0.76–1.27)
Globulin, Total: 2.4 g/dL (ref 1.5–4.5)
Glucose: 85 mg/dL (ref 70–99)
Potassium: 3.5 mmol/L (ref 3.5–5.2)
Sodium: 141 mmol/L (ref 134–144)
Total Protein: 6.7 g/dL (ref 6.0–8.5)
eGFR: 34 mL/min/{1.73_m2} — ABNORMAL LOW (ref >59)

## 2022-01-06 LAB — LIPID PANEL W/O CHOL/HDL RATIO
Cholesterol, Total: 165 mg/dL (ref 100–199)
HDL: 37 mg/dL — ABNORMAL LOW (ref >39)
LDL Chol Calc (NIH): 102 mg/dL — ABNORMAL HIGH (ref 0–99)
Triglycerides: 145 mg/dL (ref 0–149)
VLDL Cholesterol Cal: 26 mg/dL (ref 5–40)

## 2022-01-06 LAB — MICROSCOPIC EXAMINATION: Bacteria, UA: NONE SEEN

## 2022-01-06 LAB — MICROALBUMIN, URINE WAIVED
Creatinine, Urine Waived: 50 mg/dL (ref 10–300)
Microalb, Ur Waived: 150 mg/L — ABNORMAL HIGH (ref 0–19)
Microalb/Creat Ratio: >300 mg/g — ABNORMAL HIGH (ref <30)

## 2022-01-06 LAB — BAYER DCA HB A1C WAIVED: HB A1C (BAYER DCA - WAIVED): 7.1 % — ABNORMAL HIGH (ref 4.8–5.6)

## 2022-01-06 LAB — TSH: TSH: 1.71 u[IU]/mL (ref 0.450–4.500)

## 2022-01-06 LAB — PSA: Prostate Specific Ag, Serum: 2.2 ng/mL (ref 0.0–4.0)

## 2022-01-06 NOTE — Assessment & Plan Note (Signed)
Had been off his medicine until about a month ago. A1c elevated at 7.1. Will continue current regimen and recheck 3 motnhs. Call with any concerns.

## 2022-01-06 NOTE — Assessment & Plan Note (Signed)
Rechecking labs today. Await results. Refills given.

## 2022-01-07 ENCOUNTER — Other Ambulatory Visit: Payer: Self-pay | Admitting: Family Medicine

## 2022-01-08 ENCOUNTER — Other Ambulatory Visit: Payer: Self-pay | Admitting: Family Medicine

## 2022-01-09 NOTE — Telephone Encounter (Signed)
Change of pharmacy Requested Prescriptions  Pending Prescriptions Disp Refills   metoprolol tartrate (LOPRESSOR) 25 MG tablet [Pharmacy Med Name: METOPROLOL TARTRATE 25 MG TAB] 180 tablet 0    Sig: TAKE 1 TABLET BY MOUTH TWICE A DAY     Cardiovascular:  Beta Blockers Passed - 01/07/2022  8:42 AM      Passed - Last BP in normal range    BP Readings from Last 1 Encounters:  01/05/22 137/81         Passed - Last Heart Rate in normal range    Pulse Readings from Last 1 Encounters:  01/05/22 82         Passed - Valid encounter within last 6 months    Recent Outpatient Visits           4 days ago Routine general medical examination at a health care facility   [facility], [place], DO   1 year ago Type 2 diabetes with nephropathy ([place])   [place], [name], DO   1 year ago Seasonal allergic rhinitis due to pollen   [facility], [name], DO   1 year ago Type 2 diabetes with nephropathy ([place])   [place], [name], DO   2 years ago Type 2 diabetes with nephropathy ([place])   [place], [name], DO       Future Appointments             In 2 months Johnson, [name], DO [place], [facility]             hydrALAZINE (APRESOLINE) 100 MG tablet [Pharmacy Med Name: HYDRALAZINE 100 MG TABLET] 180 tablet 0    Sig: TAKE 1 TABLET BY MOUTH TWICE A DAY     Cardiovascular:  Vasodilators Failed - 01/07/2022  8:42 AM      Failed - ANA Screen, Ifa, Serum in normal range and within 360 days    No results found for: "ANA", "ANATITER", "LABANTI"       Passed - HCT in normal range and within 360 days    Hematocrit  Date Value Ref Range Status  01/05/2022 40.0 37.5 - 51.0 % Final         Passed - HGB in normal range and within 360 days    Hemoglobin  Date Value Ref Range Status  01/05/2022 13.2 13.0 - 17.7 g/dL Final         Passed - RBC in normal range and within 360  days    RBC  Date Value Ref Range Status  01/05/2022 4.84 4.14 - 5.80 x10E6/uL Final  02/15/2018 4.44 4.22 - 5.81 MIL/uL Final         Passed - WBC in normal range and within 360 days    WBC  Date Value Ref Range Status  01/05/2022 7.7 3.4 - 10.8 x10E3/uL Final  02/15/2018 6.0 4.0 - 10.5 K/uL Final         Passed - PLT in normal range and within 360 days    Platelets  Date Value Ref Range Status  01/05/2022 302 150 - 450 x10E3/uL Final         Passed - Last BP in normal range    BP Readings from Last 1 Encounters:  01/05/22 137/81         Passed - Valid encounter within last 12 months    Recent Outpatient Visits  4 days ago Routine general medical examination at a health care facility   [place], [place], DO   1 year ago Type 2 diabetes with nephropathy [facility])   [place], [name], DO   1 year ago Seasonal allergic rhinitis due to pollen   [facility], [name], DO   1 year ago Type 2 diabetes with nephropathy [facility])   [place], [name], DO   2 years ago Type 2 diabetes with nephropathy ([place])   [place], [place], DO       Future Appointments             In 2 months Johnson, [name], DO [place], [facility]             amLODipine (NORVASC) 10 MG tablet [Pharmacy Med Name: AMLODIPINE BESYLATE 10 MG TAB] 90 tablet 0    Sig: TAKE 1 TABLET BY MOUTH EVERY DAY     Cardiovascular: Calcium Channel Blockers 2 Passed - 01/07/2022  8:42 AM      Passed - Last BP in normal range    BP Readings from Last 1 Encounters:  01/05/22 137/81         Passed - Last Heart Rate in normal range    Pulse Readings from Last 1 Encounters:  01/05/22 82         Passed - Valid encounter within last 6 months    Recent Outpatient Visits           4 days ago Routine general medical examination at a health care facility   [facility], [place], DO   1 year ago Type 2 diabetes with nephropathy [facility])   [place], [name], DO   1 year ago Seasonal allergic rhinitis due to pollen   [facility], [name], DO   1 year ago Type 2 diabetes with nephropathy [facility])   [place], [name], DO   2 years ago Type 2 diabetes with nephropathy [facility])   [place], Barb Merino, DO       Future Appointments             In 2 months Johnson, Barb Merino, DO [place], [facility]

## 2022-01-10 ENCOUNTER — Other Ambulatory Visit: Payer: Self-pay | Admitting: Family Medicine

## 2022-01-10 NOTE — Telephone Encounter (Signed)
Refilled 01/05/22.

## 2022-01-11 ENCOUNTER — Other Ambulatory Visit: Payer: Self-pay

## 2022-01-11 ENCOUNTER — Ambulatory Visit: Payer: Self-pay

## 2022-01-11 MED ORDER — LEVOCETIRIZINE DIHYDROCHLORIDE 5 MG PO TABS: 5.0000 mg | ORAL_TABLET | Freq: Every evening | ORAL | 3 refills | Status: DC

## 2022-01-11 NOTE — Telephone Encounter (Signed)
Called patient to verify which pharmacy he wants medications sent to. No answer, no VM set up unable to leave message.

## 2022-01-11 NOTE — Telephone Encounter (Signed)
Requested Prescriptions  Pending Prescriptions Disp Refills   levocetirizine (XYZAL) 5 MG tablet 90 tablet 3    Sig: Take 1 tablet (5 mg total) by mouth every evening.     Ear, Nose, and Throat:  Antihistamines - levocetirizine dihydrochloride Failed - 01/11/2022 12:09 PM      Failed - Cr in normal range and within 360 days    Creatinine, Ser  Date Value Ref Range Status  01/05/2022 2.15 (H) 0.76 - 1.27 mg/dL Final         Passed - eGFR is 10 or above and within 360 days    GFR calc Af Amer  Date Value Ref Range Status  02/16/2020 38 (L) >59 mL/min/1.73 Final    Comment:    **In accordance with recommendations from the NKF-ASN Task force,**   [facility] is in the process of updating its eGFR calculation to the   2021 CKD-EPI creatinine equation that estimates kidney function   without a race variable.    GFR calc non Af Amer  Date Value Ref Range Status  02/16/2020 33 (L) >59 mL/min/1.73 Final   eGFR  Date Value Ref Range Status  01/05/2022 34 (L) >59 mL/min/1.73 Final         Passed - Valid encounter within last 12 months    Recent Outpatient Visits           6 days ago Routine general medical examination at a health care facility   [facility], [place], DO   1 year ago Type 2 diabetes with neph[facility])   [place], [place], DO   1 year ago Seasonal allergic rhinitis due to pollen   [facility], [name], DO   1 year ago Type 2 diabetes with nephropathy [facility])   [place], [name], DO   2 years ago Type 2 diabetes with nephropathy [facility])   [place], [place], DO       Future Appointments             In 2 months Johnson, Barb Merino, DO [place], [facility]

## 2022-01-11 NOTE — Telephone Encounter (Signed)
  Chief Complaint: medication management  Symptoms: NA Frequency: today Pertinent Negatives: NA Disposition: '[]'$ ED /'[]'$ Urgent Care (no appt availability in office) / '[]'$ Appointment(In office/virtual)/ '[]'$  [place] [facility]/ '[x]'$ Home Care/ '[]'$ Refused Recommended Disposition /'[]'$ [place] [facility]/ '[]'$  Follow-up with PCP Additional Notes: spoke with pt's wife. Advised her per OV on 01/05/22 pt is suppose to be taking Jardiance. Reviewed pt's medications and had no further questions or concerns.   Summary: med management   Pt wife called in and stated she was unsure if pt should be taking empagliflozin (JARDIANCE) 25 MG TABS tablet. Stated pt has not been taking for a while.  Pt seeking clinical.         Reason for Disposition  Caller has medicine question only, adult not sick, AND triager answers question  Answer Assessment - Initial Assessment Questions 1. NAME of MEDICINE: "What medicine(s) are you calling about?"     Jardiance 2. QUESTION: "What is your question?" (e.g., double dose of medicine, side effect)     Wanting to see if pt suppose to be taking or not 3. PRESCRIBER: "Who prescribed the medicine?" Reason: if prescribed by specialist, call should be referred to that group.     Dr. Wynetta Emery  Protocols used: Medication Question Call-A-AH

## 2022-01-11 NOTE — Telephone Encounter (Signed)
Requested by interface surescripts . Last refill doc . 01/05/22. Med refill too soon Requested Prescriptions  Refused Prescriptions Disp Refills   metoprolol tartrate (LOPRESSOR) 25 MG tablet [Pharmacy Med Name: METOPROLOL TARTRATE 25 MG TAB] 180 tablet 0    Sig: TAKE 1 TABLET BY MOUTH TWICE A DAY     Cardiovascular:  Beta Blockers Passed - 01/10/2022  7:21 PM      Passed - Last BP in normal range    BP Readings from Last 1 Encounters:  01/05/22 137/81         Passed - Last Heart Rate in normal range    Pulse Readings from Last 1 Encounters:  01/05/22 82         Passed - Valid encounter within last 6 months    Recent Outpatient Visits           6 days ago Routine general medical examination at a health care facility   [facility], [place], DO   1 year ago Type 2 diabetes with nephropathy [facility])   [place], [name], DO   1 year ago Seasonal allergic rhinitis due to pollen   [facility], [name], DO   1 year ago Type 2 diabetes with nephropathy ([place])   [place], [name], DO   2 years ago Type 2 diabetes with nephropathy ([place])   [place], [place], DO       Future Appointments             In 2 months Johnson, [name], DO [place], [facility]             irbesartan (AVAPRO) 300 MG tablet [Pharmacy Med Name: IRBESARTAN 300 MG TABLET] 90 tablet 0    Sig: TAKE 1 TABLET BY MOUTH EVERY DAY     Cardiovascular:  Angiotensin Receptor Blockers Failed - 01/10/2022  7:21 PM      Failed - Cr in normal range and within 180 days    Creatinine, Ser  Date Value Ref Range Status  01/05/2022 2.15 (H) 0.76 - 1.27 mg/dL Final         Passed - K in normal range and within 180 days    Potassium  Date Value Ref Range Status  01/05/2022 3.5 3.5 - 5.2 mmol/L Final         Passed - Patient is not pregnant      Passed - Last BP in normal range    BP Readings from  Last 1 Encounters:  01/05/22 137/81         Passed - Valid encounter within last 6 months    Recent Outpatient Visits           6 days ago Routine general medical examination at a health care facility   [facility], [place], DO   1 year ago Type 2 diabetes with nephropathy [facility])   [place], [name], DO   1 year ago Seasonal allergic rhinitis due to pollen   [facility], [name], DO   1 year ago Type 2 diabetes with nephropathy [facility])   [place], [name], DO   2 years ago Type 2 diabetes with nephropathy [facility])   [place], [name], DO       Future Appointments             In 2  months Johnson, [name], DO [facility], [facility]             amLODipine (NORVASC) 10 MG tablet [Pharmacy Med Name: AMLODIPINE BESYLATE 10 MG TAB] 90 tablet 0    Sig: TAKE 1 TABLET BY MOUTH EVERY DAY     Cardiovascular: Calcium Channel Blockers 2 Passed - 01/10/2022  7:21 PM      Passed - Last BP in normal range    BP Readings from Last 1 Encounters:  01/05/22 137/81         Passed - Last Heart Rate in normal range    Pulse Readings from Last 1 Encounters:  01/05/22 82         Passed - Valid encounter within last 6 months    Recent Outpatient Visits           6 days ago Routine general medical examination at a health care facility   [facility], [place], DO   1 year ago Type 2 diabetes with nephropathy [facility])   [place], [name], DO   1 year ago Seasonal allergic rhinitis due to pollen   [facility], [name], DO   1 year ago Type 2 diabetes with nephropathy [facility])   [place], [name], DO   2 years ago Type 2 diabetes with nephropathy ([place])   [place], [place], DO       Future Appointments             In 2 months Johnson, [name], DO [place], [facility]             JARDIANCE 25 MG TABS tablet [employer] Med Name: JARDIANCE 25 MG TABLET] 90 tablet 0    Sig: TAKE 1 TABLET BY MOUTH DAILY BEFORE BREAKFAST.     Endocrinology:  Diabetes - SGLT2 Inhibitors Failed - 01/10/2022  7:21 PM      Failed - Cr in normal range and within 360 days    Creatinine, Ser  Date Value Ref Range Status  01/05/2022 2.15 (H) 0.76 - 1.27 mg/dL Final         Failed - eGFR in normal range and within 360 days    GFR calc Af Amer  Date Value Ref Range Status  02/16/2020 38 (L) >59 mL/min/1.73 Final    Comment:    **In accordance with recommendations from the NKF-ASN Task force,**   [facility] is in the process of updating its eGFR calculation to the   2021 CKD-EPI creatinine equation that estimates kidney function   without a race variable.    GFR calc non Af Amer  Date Value Ref Range Status  02/16/2020 33 (L) >59 mL/min/1.73 Final   eGFR  Date Value Ref Range Status  01/05/2022 34 (L) >59 mL/min/1.73 Final         Passed - HBA1C is between 0 and 7.9 and within 180 days    HB A1C (BAYER DCA - WAIVED)  Date Value Ref Range Status  01/05/2022 7.1 (H) 4.8 - 5.6 % Final    Comment:             Prediabetes: 5.7 - 6.4          Diabetes: >6.4          Glycemic control for adults with diabetes: <7.0          Passed - Valid encounter within last 6 months  Recent Outpatient Visits           6 days ago Routine general medical examination at a health care facility   [facility], [place], DO   1 year ago Type 2 diabetes with nephropathy [facility])   [place], [name], DO   1 year ago Seasonal allergic rhinitis due to pollen   [facility], [name], DO   1 year ago Type 2 diabetes with nephropathy [facility])   [place], [name], DO   2 years ago Type 2 diabetes with nephropathy [facility])   [place], [name], DO       Future Appointments              In 2 months Johnson, Barb Merino, DO [place], [facility]

## 2022-01-11 NOTE — Telephone Encounter (Signed)
[facility] [place] that sent refill request to verify medications were dispensed to patient . Pharmacy staff reports medications picked up at [place] location.

## 2022-01-13 ENCOUNTER — Telehealth: Payer: Self-pay | Admitting: Family Medicine

## 2022-01-13 DIAGNOSIS — M10371 Gout due to renal impairment, right ankle and foot: Secondary | ICD-10-CM

## 2022-01-13 NOTE — Telephone Encounter (Signed)
OK for dme order and I'll sign

## 2022-01-13 NOTE — Telephone Encounter (Signed)
Copied from [place] [id number]. Topic: General - Inquiry >> Jan 13, 2022 10:14 AM Penni Bombard wrote: Pt 's wife called asking for an order for ace bandages for his gout.  They have medicaid and the pharmacy told him if his doctor ordered it the medicaid would pay.  She said it was to help support his feet.  CB#  [phone]   [facility] [place]

## 2022-01-20 ENCOUNTER — Ambulatory Visit: Payer: Self-pay | Admitting: *Deleted

## 2022-01-20 NOTE — Telephone Encounter (Signed)
  Chief Complaint: patient's wife on DPR reports patient had reaction after taking jardiance  Symptoms: started jardiance Monday and by Wednesday had episode at work and "fell out could not walk". Not comprehending well. Blood sugars 156 fasting . Patient stopped medication and has not had sx since. Denies sx now and patient is at work.  Frequency: 2 days ago  Pertinent Negatives: Patient denies sx now  Disposition: '[]'$ ED /'[]'$ Urgent Care (no appt availability in office) / '[x]'$ Appointment(In office/virtual)/ '[]'$  [place] [facility]/ '[]'$ Home Care/ '[]'$ Refused Recommended Disposition /'[]'$ [place] [facility]/ '[]'$  Follow-up with PCP Additional Notes:   Recommended patient / wife keep log of blood sugars and call back or go to UC/ ED if any sx. Appt scheduled with PCP 01/24/22 for  review of medications.     Reason for Disposition  [1] Caller has URGENT medicine question about med that PCP or specialist prescribed AND [2] triager unable to answer question  Answer Assessment - Initial Assessment Questions 1. NAME of MEDICINE: "What medicine(s) are you calling about?"     Jardiance 25 mg daily before breakfast  2. QUESTION: "What is your question?" (e.g., double dose of medicine, side effect)     Started medication jardiance Monday and Wednesday "fell out at work". Could not walk and difficult comprehending . 3. PRESCRIBER: "Who prescribed the medicine?" Reason: if prescribed by specialist, call should be referred to that group.     PCP 4. SYMPTOMS: "Do you have any symptoms?" If Yes, ask: "What symptoms are you having?"  "How bad are the symptoms (e.g., mild, moderate, severe)     Yes see above. Patient has stopped medication at this time  no sx reported now . 5. PREGNANCY:  "Is there any chance that you are pregnant?" "When was your last menstrual period?"     na  Protocols used: Medication Question Call-A-AH

## 2022-01-23 ENCOUNTER — Other Ambulatory Visit: Payer: Self-pay | Admitting: Family Medicine

## 2022-01-24 ENCOUNTER — Ambulatory Visit: Payer: Medicaid Other | Admitting: Family Medicine

## 2022-01-25 NOTE — Telephone Encounter (Signed)
Unable to refill per protocol, Rx medication request is too soon. Last refill 01/05/22 for 90 days.  Requested Prescriptions  Pending Prescriptions Disp Refills   hydrALAZINE (APRESOLINE) 100 MG tablet [Pharmacy Med Name: HYDRALAZINE 100 MG TABLET] 180 tablet 0    Sig: TAKE 1 TABLET BY MOUTH TWICE A DAY     Cardiovascular:  Vasodilators Failed - 01/23/2022  8:45 PM      Failed - ANA Screen, Ifa, Serum in normal range and within 360 days    No results found for: "ANA", "ANATITER", "LABANTI"       Passed - HCT in normal range and within 360 days    Hematocrit  Date Value Ref Range Status  01/05/2022 40.0 37.5 - 51.0 % Final         Passed - HGB in normal range and within 360 days    Hemoglobin  Date Value Ref Range Status  01/05/2022 13.2 13.0 - 17.7 g/dL Final         Passed - RBC in normal range and within 360 days    RBC  Date Value Ref Range Status  01/05/2022 4.84 4.14 - 5.80 x10E6/uL Final  02/15/2018 4.44 4.22 - 5.81 MIL/uL Final         Passed - WBC in normal range and within 360 days    WBC  Date Value Ref Range Status  01/05/2022 7.7 3.4 - 10.8 x10E3/uL Final  02/15/2018 6.0 4.0 - 10.5 K/uL Final         Passed - PLT in normal range and within 360 days    Platelets  Date Value Ref Range Status  01/05/2022 302 150 - 450 x10E3/uL Final         Passed - Last BP in normal range    BP Readings from Last 1 Encounters:  01/05/22 137/81         Passed - Valid encounter within last 12 months    Recent Outpatient Visits           2 weeks ago Routine general medical examination at a health care facility   [facility], [place], DO   1 year ago Type 2 diabetes with nephropathy [facility])   [place], [name], DO   1 year ago Seasonal allergic rhinitis due to pollen   [facility], [name], DO   1 year ago Type 2 diabetes with nephropathy [facility])   [place], [name], DO   2 years ago  Type 2 diabetes with nephropathy ([place])   [place], [place], DO       Future Appointments             In 1 week Wynetta Emery, [name], DO [place], [facility]   In 2 months Johnson, [name], DO [place], [facility]             hydrochlorothiazide (HYDRODIURIL) 25 MG tablet [Pharmacy Med Name: HYDROCHLOROTHIAZIDE 25 MG TAB] 90 tablet 0    Sig: TAKE 1 TABLET (25 MG TOTAL) BY MOUTH DAILY.     Cardiovascular: Diuretics - Thiazide Failed - 01/23/2022  8:45 PM      Failed - Cr in normal range and within 180 days    Creatinine, Ser  Date Value Ref Range Status  01/05/2022 2.15 (H) 0.76 - 1.27 mg/dL Final         Passed - K in normal range and within 180  days    Potassium  Date Value Ref Range Status  01/05/2022 3.5 3.5 - 5.2 mmol/L Final         Passed - Na in normal range and within 180 days    Sodium  Date Value Ref Range Status  01/05/2022 141 134 - 144 mmol/L Final         Passed - Last BP in normal range    BP Readings from Last 1 Encounters:  01/05/22 137/81         Passed - Valid encounter within last 6 months    Recent Outpatient Visits           2 weeks ago Routine general medical examination at a health care facility   [facility], [place] P, DO   1 year ago Type 2 diabetes with nephropathy [facility])   [place], [name], DO   1 year ago Seasonal allergic rhinitis due to pollen   [facility], [name], DO   1 year ago Type 2 diabetes with nephropathy [facility])   [place], [name], DO   2 years ago Type 2 diabetes with nephropathy [facility])   [place], Barb Merino, DO       Future Appointments             In 1 week Wynetta Emery, Barb Merino, DO [employer], [place]   In 2 months Wynetta Emery, Barb Merino, DO [employer], [facility]

## 2022-02-01 ENCOUNTER — Other Ambulatory Visit: Payer: Self-pay | Admitting: Family Medicine

## 2022-02-02 NOTE — Telephone Encounter (Signed)
Unable to refill per protocol, Rx request is too soon.  Requested Prescriptions  Pending Prescriptions Disp Refills   atorvastatin (LIPITOR) 80 MG tablet [Pharmacy Med Name: ATORVASTATIN 80 MG TABLET] 90 tablet 1    Sig: TAKE 1 TABLET BY MOUTH DAILY AT 6 PM.     Cardiovascular:  Antilipid - Statins Failed - 02/01/2022  9:40 PM      Failed - Lipid Panel in normal range within the last 12 months    Cholesterol, Total  Date Value Ref Range Status  01/05/2022 165 100 - 199 mg/dL Final   Cholesterol Piccolo, Waived  Date Value Ref Range Status  11/19/2014 101 <200 mg/dL Final    Comment:                            Desirable                <200                         Borderline High      200- 239                         High                     >239    LDL Chol Calc (NIH)  Date Value Ref Range Status  01/05/2022 102 (H) 0 - 99 mg/dL Final   HDL  Date Value Ref Range Status  01/05/2022 37 (L) >39 mg/dL Final   Triglycerides  Date Value Ref Range Status  01/05/2022 145 0 - 149 mg/dL Final   Triglycerides Piccolo,Waived  Date Value Ref Range Status  11/19/2014 61 <150 mg/dL Final    Comment:                            Normal                   <150                         Borderline High     150 - 199                         High                200 - 499                         Very High                >499          Passed - Patient is not pregnant      Passed - Valid encounter within last 12 months    Recent Outpatient Visits           4 weeks ago Routine general medical examination at a health care facility   [facility], [place], DO   1 year ago Type 2 diabetes with nephropathy [facility])   [place], [name], DO   1 year ago Seasonal allergic rhinitis due to pollen   [facility], [name], DO   1 year ago Type 2 diabetes with nephropathy ([place])  [place], [name], DO   2 years ago  Type 2 diabetes with nephropathy [facility])   [place], [place], DO       Future Appointments             In 4 days Wynetta Emery, Barb Merino, DO [employer], [facility]   In 2 months Johnson, [name], DO [facility], [facility]             hydrALAZINE (APRESOLINE) 100 MG tablet [employer] Med Name: HYDRALAZINE 100 MG TABLET] 180 tablet 0    Sig: TAKE 1 TABLET BY MOUTH TWICE A DAY     Cardiovascular:  Vasodilators Failed - 02/01/2022  9:40 PM      Failed - ANA Screen, Ifa, Serum in normal range and within 360 days    No results found for: "ANA", "ANATITER", "LABANTI"       Passed - HCT in normal range and within 360 days    Hematocrit  Date Value Ref Range Status  01/05/2022 40.0 37.5 - 51.0 % Final         Passed - HGB in normal range and within 360 days    Hemoglobin  Date Value Ref Range Status  01/05/2022 13.2 13.0 - 17.7 g/dL Final         Passed - RBC in normal range and within 360 days    RBC  Date Value Ref Range Status  01/05/2022 4.84 4.14 - 5.80 x10E6/uL Final  02/15/2018 4.44 4.22 - 5.81 MIL/uL Final         Passed - WBC in normal range and within 360 days    WBC  Date Value Ref Range Status  01/05/2022 7.7 3.4 - 10.8 x10E3/uL Final  02/15/2018 6.0 4.0 - 10.5 K/uL Final         Passed - PLT in normal range and within 360 days    Platelets  Date Value Ref Range Status  01/05/2022 302 150 - 450 x10E3/uL Final         Passed - Last BP in normal range    BP Readings from Last 1 Encounters:  01/05/22 137/81         Passed - Valid encounter within last 12 months    Recent Outpatient Visits           4 weeks ago Routine general medical examination at a health care facility   [facility], [place] P, DO   1 year ago Type 2 diabetes with nephropathy [facility])   [place], [name], DO   1 year ago Seasonal allergic rhinitis due to pollen   [facility], [name], DO   1  year ago Type 2 diabetes with nephropathy [facility])   [place], [name], DO   2 years ago Type 2 diabetes with nephropathy [facility])   [place], Barb Merino, DO       Future Appointments             In 4 days Wynetta Emery, Barb Merino, DO [employer], [place]   In 2 months Wynetta Emery, Barb Merino, DO [employer], [facility]

## 2022-02-06 ENCOUNTER — Ambulatory Visit: Payer: Medicaid Other | Admitting: Family Medicine

## 2022-02-15 ENCOUNTER — Telehealth: Payer: Self-pay

## 2022-02-15 NOTE — Patient Instructions (Signed)
Mr. Brosh,  I hope you are doing well and having a nice week so far.  I am reaching out on behalf of the population health pharmacy team with [facility] and Dr. Durenda Age office to see if we could schedule a time to review your medications via telephone appointment?  As a pharmacist, I go over your medications with you to discuss affordability, adherence, efficacy, and address any questions or concerns you may have.  This is a service that is no cost to you, either.  Please give me a call if you would like to schedule; my direct line is [phone].  Thank you and have a great day!  Paulina Fusi, PharmD, DPLA

## 2022-02-15 NOTE — Progress Notes (Signed)
Patient appearing on report for [employer] [facility] - Hypertension Control report due to last documented ambulatory blood pressure of 137/81 on 112/14/23. Next appointment with PCP is due March 2024.  Outreached patient to discuss hypertension control and medication management.  Patient also identified as new to managed medicaid with additional diagnosis of diabetes. Attempted to call patient and wife(designated party release) at listed numbers but no answer, no voicemail.  Will send MyChart message to contact me to set up telephone appointment.  Darlina Guys, PharmD, DPLA

## 2022-02-20 ENCOUNTER — Other Ambulatory Visit: Payer: Self-pay | Admitting: Family Medicine

## 2022-02-21 NOTE — Telephone Encounter (Signed)
Rx was sent to pharmacy on 01/05/22 and 01/09/22.   Requested Prescriptions  Refused Prescriptions Disp Refills   metFORMIN (GLUCOPHAGE-XR) 500 MG 24 hr tablet [Pharmacy Med Name: METFORMIN HCL ER 500 MG TABLET] 360 tablet 0    Sig: TAKE 2 TABLETS BY MOUTH TWICE A DAY     Endocrinology:  Diabetes - Biguanides Failed - 02/20/2022 11:12 AM      Failed - Cr in normal range and within 360 days    Creatinine, Ser  Date Value Ref Range Status  01/05/2022 2.15 (H) 0.76 - 1.27 mg/dL Final         Failed - eGFR in normal range and within 360 days    GFR calc Af Amer  Date Value Ref Range Status  02/16/2020 38 (L) >59 mL/min/1.73 Final    Comment:    **In accordance with recommendations from the NKF-ASN Task force,**   [facility] is in the process of updating its eGFR calculation to the   2021 CKD-EPI creatinine equation that estimates kidney function   without a race variable.    GFR calc non Af Amer  Date Value Ref Range Status  02/16/2020 33 (L) >59 mL/min/1.73 Final   eGFR  Date Value Ref Range Status  01/05/2022 34 (L) >59 mL/min/1.73 Final         Failed - B12 Level in normal range and within 720 days    No results found for: "VITAMINB12"       Passed - HBA1C is between 0 and 7.9 and within 180 days    HB A1C (BAYER DCA - WAIVED)  Date Value Ref Range Status  01/05/2022 7.1 (H) 4.8 - 5.6 % Final    Comment:             Prediabetes: 5.7 - 6.4          Diabetes: >6.4          Glycemic control for adults with diabetes: <7.0          Passed - Valid encounter within last 6 months    Recent Outpatient Visits           1 month ago Routine general medical examination at a health care facility   [place], [place] P, DO   1 year ago Type 2 diabetes with nephropathy [facility])   [place], [name], DO   1 year ago Seasonal allergic rhinitis due to pollen   [place] P, DO    2 years ago Type 2 diabetes with nephropathy [facility])   [place], [place] P, DO   2 years ago Type 2 diabetes with nephropathy [facility])   [place], [place], DO       Future Appointments             In 1 month Johnson, [name], DO [place], [facility]            Passed - CBC within normal limits and completed in the last 12 months    WBC  Date Value Ref Range Status  01/05/2022 7.7 3.4 - 10.8 x10E3/uL Final  02/15/2018 6.0 4.0 - 10.5 K/uL Final   RBC  Date Value Ref Range Status  01/05/2022 4.84 4.14 - 5.80 x10E6/uL Final  02/15/2018 4.44 4.22 - 5.81 MIL/uL Final   Hemoglobin  Date Value Ref Range Status  01/05/2022 13.2 13.0 - 17.7 g/dL Final   Hematocrit  Date Value Ref Range Status  01/05/2022 40.0 37.5 - 51.0 % Final   MCHC  Date Value Ref Range Status  01/05/2022 33.0 31.5 - 35.7 g/dL Final  02/15/2018 32.6 30.0 - 36.0 g/dL Final   [facility]  Date Value Ref Range Status  01/05/2022 27.3 26.6 - 33.0 pg Final  02/15/2018 27.3 26.0 - 34.0 pg Final   MCV  Date Value Ref Range Status  01/05/2022 83 79 - 97 fL Final   No results found for: "PLTCOUNTKUC", "LABPLAT", "POCPLA" RDW  Date Value Ref Range Status  01/05/2022 14.0 11.6 - 15.4 % Final          hydrochlorothiazide (HYDRODIURIL) 25 MG tablet [Pharmacy Med Name: HYDROCHLOROTHIAZIDE 25 MG TAB] 90 tablet 0    Sig: TAKE 1 TABLET (25 MG TOTAL) BY MOUTH DAILY.     Cardiovascular: Diuretics - Thiazide Failed - 02/20/2022 11:12 AM      Failed - Cr in normal range and within 180 days    Creatinine, Ser  Date Value Ref Range Status  01/05/2022 2.15 (H) 0.76 - 1.27 mg/dL Final         Passed - K in normal range and within 180 days    Potassium  Date Value Ref Range Status  01/05/2022 3.5 3.5 - 5.2 mmol/L Final         Passed - Na in normal range and within 180 days    Sodium  Date Value Ref Range Status  01/05/2022  141 134 - 144 mmol/L Final         Passed - Last BP in normal range    BP Readings from Last 1 Encounters:  01/05/22 137/81         Passed - Valid encounter within last 6 months    Recent Outpatient Visits           1 month ago Routine general medical examination at a health care facility   [place], [place] P, DO   1 year ago Type 2 diabetes with nephropathy [facility])   [place], [name], DO   1 year ago Seasonal allergic rhinitis due to pollen   [place], [place] P, DO   2 years ago Type 2 diabetes with nephropathy [facility])   [place], [name], DO   2 years ago Type 2 diabetes with nephropathy ([place])   [place], [place], DO       Future Appointments             In 1 month Johnson, [name], DO [place], [facility]             atorvastatin (LIPITOR) 80 MG tablet [Pharmacy Med Name: ATORVASTATIN 80 MG TABLET] 90 tablet 1    Sig: TAKE 1 TABLET BY MOUTH DAILY AT 6 PM.     Cardiovascular:  Antilipid - Statins Failed - 02/20/2022 11:12 AM      Failed - Lipid Panel in normal range within the last 12 months    Cholesterol, Total  Date Value Ref Range Status  01/05/2022 165 100 - 199 mg/dL Final   Cholesterol Piccolo, Waived  Date Value Ref Range Status  11/19/2014 101 <200 mg/dL Final    Comment:  Desirable                <200                         Borderline High      200- 239                         High                     >239    LDL Chol Calc (NIH)  Date Value Ref Range Status  01/05/2022 102 (H) 0 - 99 mg/dL Final   HDL  Date Value Ref Range Status  01/05/2022 37 (L) >39 mg/dL Final   Triglycerides  Date Value Ref Range Status  01/05/2022 145 0 - 149 mg/dL Final   Triglycerides Piccolo,Waived  Date Value Ref Range Status  11/19/2014 61  <150 mg/dL Final    Comment:                            Normal                   <150                         Borderline High     150 - 199                         High                200 - 499                         Very High                >499          Passed - Patient is not pregnant      Passed - Valid encounter within last 12 months    Recent Outpatient Visits           1 month ago Routine general medical examination at a health care facility   [place], [place], DO   1 year ago Type 2 diabetes with nephropathy [facility])   [place], [name], DO   1 year ago Seasonal allergic rhinitis due to pollen   [place] P, DO   2 years ago Type 2 diabetes with nephropathy [facility])   [place], [name], DO   2 years ago Type 2 diabetes with nephropathy ([place])   [place], [place], DO       Future Appointments             In 1 month Johnson, [name], DO [place], [facility]             hydrALAZINE (APRESOLINE) 100 MG tablet [Pharmacy Med Name: HYDRALAZINE 100 MG TABLET] 180 tablet 0    Sig: TAKE 1 TABLET BY MOUTH TWICE A DAY     Cardiovascular:  Vasodilators Failed - 02/20/2022 11:12 AM      Failed - ANA Screen, Ifa, Serum in normal range and within 360 days    No results found  for: "ANA", "ANATITER", "LABANTI"       Passed - HCT in normal range and within 360 days    Hematocrit  Date Value Ref Range Status  01/05/2022 40.0 37.5 - 51.0 % Final         Passed - HGB in normal range and within 360 days    Hemoglobin  Date Value Ref Range Status  01/05/2022 13.2 13.0 - 17.7 g/dL Final         Passed - RBC in normal range and within 360 days    RBC  Date Value Ref Range Status  01/05/2022 4.84 4.14 - 5.80 x10E6/uL Final  02/15/2018 4.44 4.22 - 5.81 MIL/uL Final          Passed - WBC in normal range and within 360 days    WBC  Date Value Ref Range Status  01/05/2022 7.7 3.4 - 10.8 x10E3/uL Final  02/15/2018 6.0 4.0 - 10.5 K/uL Final         Passed - PLT in normal range and within 360 days    Platelets  Date Value Ref Range Status  01/05/2022 302 150 - 450 x10E3/uL Final         Passed - Last BP in normal range    BP Readings from Last 1 Encounters:  01/05/22 137/81         Passed - Valid encounter within last 12 months    Recent Outpatient Visits           1 month ago Routine general medical examination at a health care facility   [place], [place] P, DO   1 year ago Type 2 diabetes with nephropathy [facility])   [place], [name], DO   1 year ago Seasonal allergic rhinitis due to pollen   [place], [name], DO   2 years ago Type 2 diabetes with nephropathy [facility])   [place], [name], DO   2 years ago Type 2 diabetes with nephropathy [facility])   [place], [name], DO       Future Appointments             In 1 month Johnson, Barb Merino, DO [place], [facility]

## 2022-02-27 ENCOUNTER — Telehealth: Payer: Self-pay

## 2022-02-27 NOTE — Progress Notes (Signed)
   02/27/2022  Patient ID: Anthony Feil Sr., male   DOB: [birth year], 61 y.o.   MRN: [id number] Patient appearing on report for being new to managed medicaid with a diagnosis of diabetes and uncontrolled A1c.  Outreached patient via MyChart to schedule a telephone appointment to discuss diabetes control and medication management.  Darlina Guys, PharmD, DPLA

## 2022-02-28 ENCOUNTER — Other Ambulatory Visit: Payer: Self-pay | Admitting: Family Medicine

## 2022-03-01 NOTE — Telephone Encounter (Signed)
Unable to refill per protocol, Rx request is too soon. Last refill 01/05/22 for 90 days. Next refill March.  Requested Prescriptions  Pending Prescriptions Disp Refills   metFORMIN (GLUCOPHAGE-XR) 500 MG 24 hr tablet [Pharmacy Med Name: METFORMIN HCL ER 500 MG TABLET] 360 tablet 0    Sig: TAKE 2 TABLETS BY MOUTH TWICE A DAY     Endocrinology:  Diabetes - Biguanides Failed - 02/28/2022 12:26 PM      Failed - Cr in normal range and within 360 days    Creatinine, Ser  Date Value Ref Range Status  01/05/2022 2.15 (H) 0.76 - 1.27 mg/dL Final         Failed - eGFR in normal range and within 360 days    GFR calc Af Amer  Date Value Ref Range Status  02/16/2020 38 (L) >59 mL/min/1.73 Final    Comment:    **In accordance with recommendations from the NKF-ASN Task force,**   [facility] is in the process of updating its eGFR calculation to the   2021 CKD-EPI creatinine equation that estimates kidney function   without a race variable.    GFR calc non Af Amer  Date Value Ref Range Status  02/16/2020 33 (L) >59 mL/min/1.73 Final   eGFR  Date Value Ref Range Status  01/05/2022 34 (L) >59 mL/min/1.73 Final         Failed - B12 Level in normal range and within 720 days    No results found for: "VITAMINB12"       Passed - HBA1C is between 0 and 7.9 and within 180 days    HB A1C (BAYER DCA - WAIVED)  Date Value Ref Range Status  01/05/2022 7.1 (H) 4.8 - 5.6 % Final    Comment:             Prediabetes: 5.7 - 6.4          Diabetes: >6.4          Glycemic control for adults with diabetes: <7.0          Passed - Valid encounter within last 6 months    Recent Outpatient Visits           1 month ago Routine general medical examination at a health care facility   [place], [place] P, DO   1 year ago Type 2 diabetes with nephropathy [facility])   [place], [name], DO   1 year ago Seasonal allergic rhinitis due to pollen    [place] P, DO   2 years ago Type 2 diabetes with nephropathy [facility])   [place], [place] P, DO   2 years ago Type 2 diabetes with nephropathy [facility])   [place], [place], DO       Future Appointments             In 1 month Johnson, [name], DO [place], [facility]            Passed - CBC within normal limits and completed in the last 12 months    WBC  Date Value Ref Range Status  01/05/2022 7.7 3.4 - 10.8 x10E3/uL Final  02/15/2018 6.0 4.0 - 10.5 K/uL Final   RBC  Date Value Ref Range Status  01/05/2022 4.84 4.14 - 5.80 x10E6/uL Final  02/15/2018 4.44 4.22 - 5.81 MIL/uL Final  Hemoglobin  Date Value Ref Range Status  01/05/2022 13.2 13.0 - 17.7 g/dL Final   Hematocrit  Date Value Ref Range Status  01/05/2022 40.0 37.5 - 51.0 % Final   MCHC  Date Value Ref Range Status  01/05/2022 33.0 31.5 - 35.7 g/dL Final  02/15/2018 32.6 30.0 - 36.0 g/dL Final   [facility]  Date Value Ref Range Status  01/05/2022 27.3 26.6 - 33.0 pg Final  02/15/2018 27.3 26.0 - 34.0 pg Final   MCV  Date Value Ref Range Status  01/05/2022 83 79 - 97 fL Final   No results found for: "PLTCOUNTKUC", "LABPLAT", "POCPLA" RDW  Date Value Ref Range Status  01/05/2022 14.0 11.6 - 15.4 % Final          hydrochlorothiazide (HYDRODIURIL) 25 MG tablet [Pharmacy Med Name: HYDROCHLOROTHIAZIDE 25 MG TAB] 90 tablet 0    Sig: TAKE 1 TABLET (25 MG TOTAL) BY MOUTH DAILY.     Cardiovascular: Diuretics - Thiazide Failed - 02/28/2022 12:26 PM      Failed - Cr in normal range and within 180 days    Creatinine, Ser  Date Value Ref Range Status  01/05/2022 2.15 (H) 0.76 - 1.27 mg/dL Final         Passed - K in normal range and within 180 days    Potassium  Date Value Ref Range Status  01/05/2022 3.5 3.5 - 5.2 mmol/L Final         Passed - Na in normal range and within 180 days     Sodium  Date Value Ref Range Status  01/05/2022 141 134 - 144 mmol/L Final         Passed - Last BP in normal range    BP Readings from Last 1 Encounters:  01/05/22 137/81         Passed - Valid encounter within last 6 months    Recent Outpatient Visits           1 month ago Routine general medical examination at a health care facility   [place], [place] P, DO   1 year ago Type 2 diabetes with nephropathy [facility])   [place], [name], DO   1 year ago Seasonal allergic rhinitis due to pollen   [place], [place] P, DO   2 years ago Type 2 diabetes with nephropathy [facility])   [place], [name], DO   2 years ago Type 2 diabetes with nephropathy ([place])   [place], [place], DO       Future Appointments             In 1 month Johnson, [name], DO [place], [facility]             hydrALAZINE (APRESOLINE) 100 MG tablet [Pharmacy Med Name: HYDRALAZINE 100 MG TABLET] 180 tablet 0    Sig: TAKE 1 TABLET BY MOUTH TWICE A DAY     Cardiovascular:  Vasodilators Failed - 02/28/2022 12:26 PM      Failed - ANA Screen, Ifa, Serum in normal range and within 360 days    No results found for: "ANA", "ANATITER", "LABANTI"       Passed - HCT in normal range and within 360 days    Hematocrit  Date Value Ref Range Status  01/05/2022 40.0 37.5 - 51.0 % Final  Passed - HGB in normal range and within 360 days    Hemoglobin  Date Value Ref Range Status  01/05/2022 13.2 13.0 - 17.7 g/dL Final         Passed - RBC in normal range and within 360 days    RBC  Date Value Ref Range Status  01/05/2022 4.84 4.14 - 5.80 x10E6/uL Final  02/15/2018 4.44 4.22 - 5.81 MIL/uL Final         Passed - WBC in normal range and within 360 days    WBC  Date Value Ref Range Status  01/05/2022 7.7 3.4 - 10.8  x10E3/uL Final  02/15/2018 6.0 4.0 - 10.5 K/uL Final         Passed - PLT in normal range and within 360 days    Platelets  Date Value Ref Range Status  01/05/2022 302 150 - 450 x10E3/uL Final         Passed - Last BP in normal range    BP Readings from Last 1 Encounters:  01/05/22 137/81         Passed - Valid encounter within last 12 months    Recent Outpatient Visits           1 month ago Routine general medical examination at a health care facility   [place], [place] P, DO   1 year ago Type 2 diabetes with nephropathy [facility])   [place], [name], DO   1 year ago Seasonal allergic rhinitis due to pollen   [place], [place] P, DO   2 years ago Type 2 diabetes with nephropathy [facility])   [place], [name], DO   2 years ago Type 2 diabetes with nephropathy ([place])   [place], [place], DO       Future Appointments             In 1 month Johnson, [name], DO [place], [facility]             atorvastatin (LIPITOR) 80 MG tablet [Pharmacy Med Name: ATORVASTATIN 80 MG TABLET] 90 tablet 1    Sig: TAKE 1 TABLET BY MOUTH DAILY AT 6 PM.     Cardiovascular:  Antilipid - Statins Failed - 02/28/2022 12:26 PM      Failed - Lipid Panel in normal range within the last 12 months    Cholesterol, Total  Date Value Ref Range Status  01/05/2022 165 100 - 199 mg/dL Final   Cholesterol Piccolo, Waived  Date Value Ref Range Status  11/19/2014 101 <200 mg/dL Final    Comment:                            Desirable                <200                         Borderline High      200- 239                         High                     >239    LDL Chol Calc (NIH)  Date Value  Ref Range Status  01/05/2022 102 (H) 0 - 99 mg/dL Final   HDL  Date Value Ref Range Status  01/05/2022 37 (L) >39 mg/dL  Final   Triglycerides  Date Value Ref Range Status  01/05/2022 145 0 - 149 mg/dL Final   Triglycerides Piccolo,Waived  Date Value Ref Range Status  11/19/2014 61 <150 mg/dL Final    Comment:                            Normal                   <150                         Borderline High     150 - 199                         High                200 - 499                         Very High                >499          Passed - Patient is not pregnant      Passed - Valid encounter within last 12 months    Recent Outpatient Visits           1 month ago Routine general medical examination at a health care facility   [place], [place], DO   1 year ago Type 2 diabetes with nephropathy [facility])   [place], [name], DO   1 year ago Seasonal allergic rhinitis due to pollen   [place] P, DO   2 years ago Type 2 diabetes with nephropathy [facility])   [place] P, DO   2 years ago Type 2 diabetes with nephropathy [facility])   [place], [name], DO       Future Appointments             In 1 month Johnson, Barb Merino, DO [place], [facility]

## 2022-03-13 ENCOUNTER — Telehealth: Payer: Self-pay

## 2022-03-13 NOTE — Progress Notes (Signed)
   Care Guide Note  03/13/2022 Name: Anthony RAUTH Sr. MRN: [id number] DOB: 06/13/1960  Referred by: Valerie Roys, DO Reason for referral : Care Coordination (Outreach to schedule with Pharm d New MM DM )   Anthony F Start Sr. is a 62 y.o. year old male who is a primary care patient of Valerie Roys, DO. Anthony Feil Sr. was referred to the pharmacist for assistance related to HTN and DM.    An unsuccessful telephone outreach was attempted today to contact the patient who was referred to the pharmacy team for assistance with medication management. Additional attempts will be made to contact the patient.   Noreene Larsson, [place], [place] [address] Direct Dial: [phone] [email or web address]

## 2022-03-20 ENCOUNTER — Other Ambulatory Visit: Payer: Self-pay | Admitting: Family Medicine

## 2022-03-21 NOTE — Telephone Encounter (Signed)
Requested Prescriptions  Pending Prescriptions Disp Refills   hydrochlorothiazide (HYDRODIURIL) 25 MG tablet [Pharmacy Med Name: HYDROCHLOROTHIAZIDE 25 MG TAB] 90 tablet 0    Sig: TAKE 1 TABLET (25 MG TOTAL) BY MOUTH DAILY.     Cardiovascular: Diuretics - Thiazide Failed - 03/20/2022  9:10 AM      Failed - Cr in normal range and within 180 days    Creatinine, Ser  Date Value Ref Range Status  01/05/2022 2.15 (H) 0.76 - 1.27 mg/dL Final         Passed - K in normal range and within 180 days    Potassium  Date Value Ref Range Status  01/05/2022 3.5 3.5 - 5.2 mmol/L Final         Passed - Na in normal range and within 180 days    Sodium  Date Value Ref Range Status  01/05/2022 141 134 - 144 mmol/L Final         Passed - Last BP in normal range    BP Readings from Last 1 Encounters:  01/05/22 137/81         Passed - Valid encounter within last 6 months    Recent Outpatient Visits           2 months ago Routine general medical examination at a health care facility   [place], [place] P, DO   1 year ago Type 2 diabetes with nephropathy [facility])   [place], [name], DO   1 year ago Seasonal allergic rhinitis due to pollen   [place], [place] P, DO   2 years ago Type 2 diabetes with nephropathy [facility])   [place], [name], DO   2 years ago Type 2 diabetes with nephropathy ([address])   [place], [place], DO       Future Appointments             In 2 weeks Johnson, [name], DO [place], [facility]             atorvastatin (LIPITOR) 80 MG tablet [Pharmacy Med Name: ATORVASTATIN 80 MG TABLET] 90 tablet 0    Sig: TAKE 1 TABLET BY MOUTH DAILY AT 6 PM.     Cardiovascular:  Antilipid - Statins Failed - 03/20/2022  9:10 AM      Failed - Lipid Panel in normal range within the last 12 months     Cholesterol, Total  Date Value Ref Range Status  01/05/2022 165 100 - 199 mg/dL Final   Cholesterol Piccolo, Waived  Date Value Ref Range Status  11/19/2014 101 <200 mg/dL Final    Comment:                            Desirable                <200                         Borderline High      200- 239                         High                     >239  LDL Chol Calc (NIH)  Date Value Ref Range Status  01/05/2022 102 (H) 0 - 99 mg/dL Final   HDL  Date Value Ref Range Status  01/05/2022 37 (L) >39 mg/dL Final   Triglycerides  Date Value Ref Range Status  01/05/2022 145 0 - 149 mg/dL Final   Triglycerides Piccolo,Waived  Date Value Ref Range Status  11/19/2014 61 <150 mg/dL Final    Comment:                            Normal                   <150                         Borderline High     150 - 199                         High                200 - 499                         Very High                >499          Passed - Patient is not pregnant      Passed - Valid encounter within last 12 months    Recent Outpatient Visits           2 months ago Routine general medical examination at a health care facility   [place], [place], DO   1 year ago Type 2 diabetes with nephropathy ([place])   [place], [name], DO   1 year ago Seasonal allergic rhinitis due to pollen   [place] P, DO   2 years ago Type 2 diabetes with nephropathy [facility])   [place], [name], DO   2 years ago Type 2 diabetes with nephropathy ([place])   [place], [place], DO       Future Appointments             In 2 weeks Wynetta Emery, Barb Merino, DO [place], [facility]             metFORMIN (GLUCOPHAGE-XR) 500 MG 24 hr tablet [Pharmacy Med Name: METFORMIN HCL ER 500 MG TABLET] 360 tablet 0     Sig: TAKE 2 [place] A DAY     Endocrinology:  Diabetes - Biguanides Failed - 03/20/2022  9:10 AM      Failed - Cr in normal range and within 360 days    Creatinine, Ser  Date Value Ref Range Status  01/05/2022 2.15 (H) 0.76 - 1.27 mg/dL Final         Failed - eGFR in normal range and within 360 days    GFR calc Af Amer  Date Value Ref Range Status  02/16/2020 38 (L) >59 mL/min/1.73 Final    Comment:    **In accordance with recommendations from the NKF-ASN Task force,**   [facility] is in the process of updating its eGFR calculation to the   2021 CKD-EPI creatinine equation that estimates kidney function   without  a race variable.    GFR calc non Af Amer  Date Value Ref Range Status  02/16/2020 33 (L) >59 mL/min/1.73 Final   eGFR  Date Value Ref Range Status  01/05/2022 34 (L) >59 mL/min/1.73 Final         Failed - B12 Level in normal range and within 720 days    No results found for: "VITAMINB12"       Passed - HBA1C is between 0 and 7.9 and within 180 days    HB A1C (BAYER DCA - WAIVED)  Date Value Ref Range Status  01/05/2022 7.1 (H) 4.8 - 5.6 % Final    Comment:             Prediabetes: 5.7 - 6.4          Diabetes: >6.4          Glycemic control for adults with diabetes: <7.0          Passed - Valid encounter within last 6 months    Recent Outpatient Visits           2 months ago Routine general medical examination at a health care facility   [place], [place] P, DO   1 year ago Type 2 diabetes with nephropathy [facility])   [place], [name], DO   1 year ago Seasonal allergic rhinitis due to pollen   [place], [name], DO   2 years ago Type 2 diabetes with nephropathy [facility])   [place], [name], DO   2 years ago Type 2 diabetes with nephropathy ([place])   [place], [place], DO        Future Appointments             In 2 weeks Johnson, Barb Merino, DO [place], [facility]            Passed - CBC within normal limits and completed in the last 12 months    WBC  Date Value Ref Range Status  01/05/2022 7.7 3.4 - 10.8 x10E3/uL Final  02/15/2018 6.0 4.0 - 10.5 K/uL Final   RBC  Date Value Ref Range Status  01/05/2022 4.84 4.14 - 5.80 x10E6/uL Final  02/15/2018 4.44 4.22 - 5.81 MIL/uL Final   Hemoglobin  Date Value Ref Range Status  01/05/2022 13.2 13.0 - 17.7 g/dL Final   Hematocrit  Date Value Ref Range Status  01/05/2022 40.0 37.5 - 51.0 % Final   MCHC  Date Value Ref Range Status  01/05/2022 33.0 31.5 - 35.7 g/dL Final  02/15/2018 32.6 30.0 - 36.0 g/dL Final   [facility]  Date Value Ref Range Status  01/05/2022 27.3 26.6 - 33.0 pg Final  02/15/2018 27.3 26.0 - 34.0 pg Final   MCV  Date Value Ref Range Status  01/05/2022 83 79 - 97 fL Final   No results found for: "PLTCOUNTKUC", "LABPLAT", "POCPLA" RDW  Date Value Ref Range Status  01/05/2022 14.0 11.6 - 15.4 % Final

## 2022-03-27 NOTE — Progress Notes (Signed)
   Care Guide Note  03/27/2022 Name: ABHIRAM GOZA Sr. MRN: [id number] DOB: Jan 29, 1960  Referred by: Valerie Roys, DO Reason for referral : Care Coordination (Outreach to schedule with Pharm d New MM DM )   Harrison F Goehring Sr. is a 62 y.o. year old male who is a primary care patient of Valerie Roys, DO. [name] was referred to the pharmacist for assistance related to DM.    A second unsuccessful telephone outreach was attempted today to contact the patient who was referred to the pharmacy team for assistance with medication management. Additional attempts will be made to contact the patient.  Noreene Larsson, [place], [place] [address] Direct Dial: [phone] [email or web address]

## 2022-03-31 ENCOUNTER — Other Ambulatory Visit: Payer: Self-pay | Admitting: Family Medicine

## 2022-04-03 NOTE — Telephone Encounter (Signed)
Requested medication (s) are due for refill today:   Yes for all 3  Requested medication (s) are on the active medication list:   Yes for all 3  Future visit scheduled:   Yes in 3 days   Last ordered: Allopurinol 01/05/2022 #90, 0 refills;   Metformin 03/21/2022 #360, 0 refills;  HCTZ 03/21/2022 #90, 0 refills  Returned because a uric acid and B12 are due per protocol.       Requested Prescriptions  Pending Prescriptions Disp Refills   allopurinol (ZYLOPRIM) 100 MG tablet [Pharmacy Med Name: ALLOPURINOL 100 MG TABLET] 90 tablet 0    Sig: TAKE 1 TABLET BY [place] DAY     Endocrinology:  Gout Agents - allopurinol Failed - 03/31/2022  3:49 PM      Failed - Uric Acid in normal range and within 360 days    Uric Acid  Date Value Ref Range Status  12/28/2020 8.3 3.8 - 8.4 mg/dL Final    Comment:               Therapeutic target for gout patients: <6.0         Failed - Cr in normal range and within 360 days    Creatinine, Ser  Date Value Ref Range Status  01/05/2022 2.15 (H) 0.76 - 1.27 mg/dL Final         Passed - Valid encounter within last 12 months    Recent Outpatient Visits           2 months ago Routine general medical examination at a health care facility   [place], [place] P, DO   1 year ago Type 2 diabetes with nephropathy [facility])   [place], [name], DO   2 years ago Seasonal allergic rhinitis due to pollen   [place], [name], DO   2 years ago Type 2 diabetes with nephropathy [facility])   [place], [name], DO   2 years ago Type 2 diabetes with nephropathy ([place])   [place], [place], DO       Future Appointments             In 3 days Johnson, Barb Merino, DO [place], [facility]            Passed - CBC within normal limits and completed in the last 12 months    WBC   Date Value Ref Range Status  01/05/2022 7.7 3.4 - 10.8 x10E3/uL Final  02/15/2018 6.0 4.0 - 10.5 K/uL Final   RBC  Date Value Ref Range Status  01/05/2022 4.84 4.14 - 5.80 x10E6/uL Final  02/15/2018 4.44 4.22 - 5.81 MIL/uL Final   Hemoglobin  Date Value Ref Range Status  01/05/2022 13.2 13.0 - 17.7 g/dL Final   Hematocrit  Date Value Ref Range Status  01/05/2022 40.0 37.5 - 51.0 % Final   MCHC  Date Value Ref Range Status  01/05/2022 33.0 31.5 - 35.7 g/dL Final  02/15/2018 32.6 30.0 - 36.0 g/dL Final   [facility]  Date Value Ref Range Status  01/05/2022 27.3 26.6 - 33.0 pg Final  02/15/2018 27.3 26.0 - 34.0 pg Final   MCV  Date Value Ref Range Status  01/05/2022 83 79 - 97 fL Final   No results found for: "PLTCOUNTKUC", "LABPLAT", "POCPLA" RDW  Date Value Ref Range Status  01/05/2022 14.0  11.6 - 15.4 % Final          metFORMIN (GLUCOPHAGE-XR) 500 MG 24 hr tablet [Pharmacy Med Name: METFORMIN HCL ER 500 MG TABLET] 360 tablet 0    Sig: TAKE 2 TABLETS BY MOUTH TWICE A DAY     Endocrinology:  Diabetes - Biguanides Failed - 03/31/2022  3:49 PM      Failed - Cr in normal range and within 360 days    Creatinine, Ser  Date Value Ref Range Status  01/05/2022 2.15 (H) 0.76 - 1.27 mg/dL Final         Failed - eGFR in normal range and within 360 days    GFR calc Af Amer  Date Value Ref Range Status  02/16/2020 38 (L) >59 mL/min/1.73 Final    Comment:    **In accordance with recommendations from the NKF-ASN Task force,**   [facility] is in the process of updating its eGFR calculation to the   2021 CKD-EPI creatinine equation that estimates kidney function   without a race variable.    GFR calc non Af Amer  Date Value Ref Range Status  02/16/2020 33 (L) >59 mL/min/1.73 Final   eGFR  Date Value Ref Range Status  01/05/2022 34 (L) >59 mL/min/1.73 Final         Failed - B12 Level in normal range and within 720 days    No results found for: "VITAMINB12"       Passed -  HBA1C is between 0 and 7.9 and within 180 days    HB A1C (BAYER DCA - WAIVED)  Date Value Ref Range Status  01/05/2022 7.1 (H) 4.8 - 5.6 % Final    Comment:             Prediabetes: 5.7 - 6.4          Diabetes: >6.4          Glycemic control for adults with diabetes: <7.0          Passed - Valid encounter within last 6 months    Recent Outpatient Visits           2 months ago Routine general medical examination at a health care facility   [place], [place] P, DO   1 year ago Type 2 diabetes with nephropathy [facility])   [place], [name], DO   2 years ago Seasonal allergic rhinitis due to pollen   [place], [place] P, DO   2 years ago Type 2 diabetes with nephropathy [facility])   [place], [name], DO   2 years ago Type 2 diabetes with nephropathy [facility])   [place], [address], DO       Future Appointments             In 3 days Johnson, Barb Merino, DO [place], [facility]            Passed - CBC within normal limits and completed in the last 12 months    WBC  Date Value Ref Range Status  01/05/2022 7.7 3.4 - 10.8 x10E3/uL Final  02/15/2018 6.0 4.0 - 10.5 K/uL Final   RBC  Date Value Ref Range Status  01/05/2022 4.84 4.14 - 5.80 x10E6/uL Final  02/15/2018 4.44 4.22 - 5.81 MIL/uL Final   Hemoglobin  Date Value Ref Range Status  01/05/2022 13.2 13.0 - 17.7  g/dL Final   Hematocrit  Date Value Ref Range Status  01/05/2022 40.0 37.5 - 51.0 % Final   MCHC  Date Value Ref Range Status  01/05/2022 33.0 31.5 - 35.7 g/dL Final  02/15/2018 32.6 30.0 - 36.0 g/dL Final   [facility]  Date Value Ref Range Status  01/05/2022 27.3 26.6 - 33.0 pg Final  02/15/2018 27.3 26.0 - 34.0 pg Final   MCV  Date Value Ref Range Status  01/05/2022 83 79 - 97 fL Final   No results found for: "PLTCOUNTKUC",  "LABPLAT", "POCPLA" RDW  Date Value Ref Range Status  01/05/2022 14.0 11.6 - 15.4 % Final          hydrochlorothiazide (HYDRODIURIL) 25 MG tablet [Pharmacy Med Name: HYDROCHLOROTHIAZIDE 25 MG TAB] 90 tablet 0    Sig: TAKE 1 TABLET (25 MG TOTAL) BY MOUTH DAILY.     Cardiovascular: Diuretics - Thiazide Failed - 03/31/2022  3:49 PM      Failed - Cr in normal range and within 180 days    Creatinine, Ser  Date Value Ref Range Status  01/05/2022 2.15 (H) 0.76 - 1.27 mg/dL Final         Passed - K in normal range and within 180 days    Potassium  Date Value Ref Range Status  01/05/2022 3.5 3.5 - 5.2 mmol/L Final         Passed - Na in normal range and within 180 days    Sodium  Date Value Ref Range Status  01/05/2022 141 134 - 144 mmol/L Final         Passed - Last BP in normal range    BP Readings from Last 1 Encounters:  01/05/22 137/81         Passed - Valid encounter within last 6 months    Recent Outpatient Visits           2 months ago Routine general medical examination at a health care facility   [place], [place] P, DO   1 year ago Type 2 diabetes with nephropathy [facility])   [place], [name], DO   2 years ago Seasonal allergic rhinitis due to pollen   [place] P, DO   2 years ago Type 2 diabetes with nephropathy [facility])   [place], [name], DO   2 years ago Type 2 diabetes with nephropathy [facility])   [place], [name], DO       Future Appointments             In 3 days Valerie Roys, DO [place], [facility]

## 2022-04-03 NOTE — Telephone Encounter (Signed)
Called patient to see if he has enough medication to last until his scheduled appointment on Thursday with Dr Wynetta Emery. Unable to leave message as patient's voicemail is not set up.

## 2022-04-05 NOTE — Progress Notes (Signed)
   Care Guide Note  04/05/2022 Name: Anthony MASOUD Sr. MRN: [id number] DOB: [birth year]  Referred by: Valerie Roys, DO Reason for referral : Care Coordination (Outreach to schedule with Pharm d New MM DM )   Anthony F Kohlmeyer Sr. is a 62 y.o. year old male who is a primary care patient of Valerie Roys, DO. Anthony Feil Sr. was referred to the pharmacist for assistance related to DM.    A third unsuccessful telephone outreach was attempted today to contact the patient who was referred to the pharmacy team for assistance with medication management. The Population Health team is pleased to engage with this patient at any time in the future upon receipt of referral and should he/she be interested in assistance from the [facility] team.   Noreene Larsson, [place], [place] [address] Direct Dial: [phone] [email or web address]

## 2022-04-06 ENCOUNTER — Encounter: Payer: Self-pay | Admitting: Family Medicine

## 2022-04-06 ENCOUNTER — Ambulatory Visit (INDEPENDENT_AMBULATORY_CARE_PROVIDER_SITE_OTHER): Payer: Medicaid Other | Admitting: Family Medicine

## 2022-04-06 VITALS — BP 132/86 | HR 83 | Temp 98.5°F | Ht 70.75 in | Wt 218.3 lb

## 2022-04-06 DIAGNOSIS — I1310 Hypertensive heart and chronic kidney disease without heart failure, with stage 1 through stage 4 chronic kidney disease, or unspecified chronic kidney disease: Secondary | ICD-10-CM

## 2022-04-06 DIAGNOSIS — E1121 Type 2 diabetes mellitus with diabetic nephropathy: Secondary | ICD-10-CM

## 2022-04-06 DIAGNOSIS — N183 Chronic kidney disease, stage 3 unspecified: Secondary | ICD-10-CM | POA: Diagnosis not present

## 2022-04-06 LAB — BAYER DCA HB A1C WAIVED: HB A1C (BAYER DCA - WAIVED): 6.6 % — ABNORMAL HIGH (ref 4.8–5.6)

## 2022-04-06 MED ORDER — IRBESARTAN 300 MG PO TABS: 300.0000 mg | ORAL_TABLET | Freq: Every day | ORAL | 1 refills | Status: DC

## 2022-04-06 MED ORDER — AMLODIPINE BESYLATE 10 MG PO TABS: 10.0000 mg | ORAL_TABLET | Freq: Every day | ORAL | 1 refills | Status: DC

## 2022-04-06 MED ORDER — ATORVASTATIN CALCIUM 80 MG PO TABS: 80.0000 mg | ORAL_TABLET | Freq: Every day | ORAL | 1 refills | Status: DC

## 2022-04-06 MED ORDER — HYDROCHLOROTHIAZIDE 25 MG PO TABS: 25.0000 mg | ORAL_TABLET | Freq: Every day | ORAL | 1 refills | Status: DC

## 2022-04-06 MED ORDER — EMPAGLIFLOZIN 25 MG PO TABS: 25.0000 mg | ORAL_TABLET | Freq: Every day | ORAL | 1 refills | Status: DC

## 2022-04-06 MED ORDER — METFORMIN HCL ER 500 MG PO TB24: 1000.0000 mg | ORAL_TABLET | Freq: Two times a day (BID) | ORAL | 1 refills | Status: DC

## 2022-04-06 MED ORDER — HYDRALAZINE HCL 100 MG PO TABS: 100.0000 mg | ORAL_TABLET | Freq: Two times a day (BID) | ORAL | 1 refills | Status: DC

## 2022-04-06 MED ORDER — METOPROLOL TARTRATE 25 MG PO TABS: 25.0000 mg | ORAL_TABLET | Freq: Two times a day (BID) | ORAL | 1 refills | Status: DC

## 2022-04-06 MED ORDER — ALLOPURINOL 100 MG PO TABS: 100.0000 mg | ORAL_TABLET | Freq: Every day | ORAL | 1 refills | Status: DC

## 2022-04-06 NOTE — Assessment & Plan Note (Signed)
Doing well with A1c of 6.6. Continue current regimen. Continue to monitor. Recheck 3 months. Call with any concerns.

## 2022-04-06 NOTE — Assessment & Plan Note (Signed)
Under good control on current regimen. Continue current regimen. Continue to monitor. Call with any concerns. Refills given. Labs drawn today.   

## 2022-04-06 NOTE — Patient Instructions (Addendum)
Patient has an appointment for April 10th at 2:30 PM. If patient has any questions regarding his appointment. Patient would need to reach out to their office directly at [phone].

## 2022-04-06 NOTE — Progress Notes (Signed)
BP 132/86   Pulse 83   Temp 98.5 F (36.9 C) (Oral)   Ht 5' 10.75" (1.797 m)   Wt 218 lb 4.8 oz (99 kg)   SpO2 96%   BMI 30.66 kg/m    Subjective:    Patient ID: Anthony Ouch., male    DOB: Mar 07, 1960, 62 y.o.   MRN: [id number]  HPI: Anthony Crute. is a 62 y.o. male  Chief Complaint  Patient presents with   Diabetes    Patient declines having a recent Diabetic Eye Exam.    DIABETES Hypoglycemic episodes:no Polydipsia/polyuria: no Visual disturbance: no Chest pain: no Paresthesias: no Glucose Monitoring: no  Accucheck frequency: Not Checking Taking Insulin?: no Blood Pressure Monitoring: not checking Retinal Examination: Not up to Date Foot Exam: Up to Date Diabetic Education: Completed Pneumovax: Up to Date Influenza: Not up to Date Aspirin: no  HYPERTENSION with Chronic Kidney Disease Hypertension status: controlled  Satisfied with current treatment? yes Duration of hypertension: chronic BP monitoring frequency:  not checking BP medication side effects:  no Medication compliance: excellent compliance Aspirin: no Recurrent headaches: no Visual changes: no Palpitations: no Dyspnea: no Chest pain: no Lower extremity edema: no Dizzy/lightheaded: no  Relevant past medical, surgical, family and social history reviewed and updated as indicated. Interim medical history since our last visit reviewed. Allergies and medications reviewed and updated.  Review of Systems  Constitutional: Negative.   Respiratory: Negative.    Cardiovascular: Negative.   Gastrointestinal: Negative.   Musculoskeletal: Negative.   Neurological: Negative.   Psychiatric/Behavioral: Negative.      Per HPI unless specifically indicated above     Objective:    BP 132/86   Pulse 83   Temp 98.5 F (36.9 C) (Oral)   Ht 5' 10.75" (1.797 m)   Wt 218 lb 4.8 oz (99 kg)   SpO2 96%   BMI 30.66 kg/m   Wt Readings from Last 3 Encounters:  04/06/22 218 lb 4.8 oz (99 kg)   01/05/22 224 lb 1.6 oz (101.7 kg)  02/16/20 219 lb 9.6 oz (99.6 kg)    Physical Exam Vitals and nursing note reviewed.  Constitutional:      General: He is not in acute distress.    Appearance: Normal appearance. He is not ill-appearing, toxic-appearing or diaphoretic.  HENT:     Head: Normocephalic and atraumatic.     Right Ear: External ear normal.     Left Ear: External ear normal.     Nose: Nose normal.     Mouth/Throat:     Mouth: Mucous membranes are moist.     Pharynx: Oropharynx is clear.  Eyes:     General: No scleral icterus.       Right eye: No discharge.        Left eye: No discharge.     Extraocular Movements: Extraocular movements intact.     Conjunctiva/sclera: Conjunctivae normal.     Pupils: Pupils are equal, round, and reactive to light.  Cardiovascular:     Rate and Rhythm: Normal rate and regular rhythm.     Pulses: Normal pulses.     Heart sounds: Normal heart sounds. No murmur heard.    No friction rub. No gallop.  Pulmonary:     Effort: Pulmonary effort is normal. No respiratory distress.     Breath sounds: Normal breath sounds. No stridor. No wheezing, rhonchi or rales.  Chest:     Chest wall: No tenderness.  Musculoskeletal:  General: Normal range of motion.     Cervical back: Normal range of motion and neck supple.  Skin:    General: Skin is warm and dry.     Capillary Refill: Capillary refill takes less than 2 seconds.     Coloration: Skin is not jaundiced or pale.     Findings: No bruising, erythema, lesion or rash.  Neurological:     General: No focal deficit present.     Mental Status: He is alert and oriented to person, place, and time. Mental status is at baseline.  Psychiatric:        Mood and Affect: Mood normal.        Behavior: Behavior normal.        Thought Content: Thought content normal.        Judgment: Judgment normal.     Results for orders placed or performed in visit on 01/05/22  Microscopic Examination   Urine   Result Value Ref Range   WBC, UA 0-5 0 - 5 /hpf   RBC, Urine 0-2 0 - 2 /hpf   Epithelial Cells (non renal) 0-10 0 - 10 /hpf   Casts Present (A) None seen /lpf   Cast Type Hyaline casts N/A   Bacteria, UA None seen None seen/Few  Comprehensive metabolic panel  Result Value Ref Range   Glucose 85 70 - 99 mg/dL   BUN 30 (H) 8 - 27 mg/dL   Creatinine, Ser 2.15 (H) 0.76 - 1.27 mg/dL   eGFR 34 (L) >59 mL/min/1.73   BUN/Creatinine Ratio 14 10 - 24   Sodium 141 134 - 144 mmol/L   Potassium 3.5 3.5 - 5.2 mmol/L   Chloride 102 96 - 106 mmol/L   CO2 23 20 - 29 mmol/L   Calcium 9.6 8.6 - 10.2 mg/dL   Total Protein 6.7 6.0 - 8.5 g/dL   Albumin 4.3 3.8 - 4.9 g/dL   Globulin, Total 2.4 1.5 - 4.5 g/dL   Albumin/Globulin Ratio 1.8 1.2 - 2.2   Bilirubin Total 0.4 0.0 - 1.2 mg/dL   Alkaline Phosphatase 84 44 - 121 IU/L   AST 21 0 - 40 IU/L   ALT 16 0 - 44 IU/L  CBC with Differential/Platelet  Result Value Ref Range   WBC 7.7 3.4 - 10.8 x10E3/uL   RBC 4.84 4.14 - 5.80 x10E6/uL   Hemoglobin 13.2 13.0 - 17.7 g/dL   Hematocrit 40.0 37.5 - 51.0 %   MCV 83 79 - 97 fL   MCH 27.3 26.6 - 33.0 pg   MCHC 33.0 31.5 - 35.7 g/dL   RDW 14.0 11.6 - 15.4 %   Platelets 302 150 - 450 x10E3/uL   Neutrophils 66 Not Estab. %   Lymphs 21 Not Estab. %   Monocytes 8 Not Estab. %   Eos 4 Not Estab. %   Basos 1 Not Estab. %   Neutrophils Absolute 5.1 1.4 - 7.0 x10E3/uL   Lymphocytes Absolute 1.6 0.7 - 3.1 x10E3/uL   Monocytes Absolute 0.6 0.1 - 0.9 x10E3/uL   EOS (ABSOLUTE) 0.3 0.0 - 0.4 x10E3/uL   Basophils Absolute 0.1 0.0 - 0.2 x10E3/uL   Immature Granulocytes 0 Not Estab. %   Immature Grans (Abs) 0.0 0.0 - 0.1 x10E3/uL  Lipid Panel w/o Chol/HDL Ratio  Result Value Ref Range   Cholesterol, Total 165 100 - 199 mg/dL   Triglycerides 145 0 - 149 mg/dL   HDL 37 (L) >39 mg/dL   VLDL Cholesterol Cal 26 5 -  40 mg/dL   LDL Chol Calc (NIH) 102 (H) 0 - 99 mg/dL  PSA  Result Value Ref Range   Prostate  Specific Ag, Serum 2.2 0.0 - 4.0 ng/mL  TSH  Result Value Ref Range   TSH 1.710 0.450 - 4.500 uIU/mL  Urinalysis, Routine w reflex microscopic  Result Value Ref Range   Specific Gravity, UA 1.025 1.005 - 1.030   pH, UA 5.0 5.0 - 7.5   Color, UA Yellow Yellow   Appearance Ur Clear Clear   Leukocytes,UA Negative Negative   Protein,UA 3+ (A) Negative/Trace   Glucose, UA Negative Negative   Ketones, UA Negative Negative   RBC, UA Negative Negative   Bilirubin, UA Negative Negative   Urobilinogen, Ur 0.2 0.2 - 1.0 mg/dL   Nitrite, UA Negative Negative   Microscopic Examination See below:   Microalbumin, Urine Waived  Result Value Ref Range   Microalb, Ur Waived 150 (H) 0 - 19 mg/L   Creatinine, Urine Waived 50 10 - 300 mg/dL   Microalb/Creat Ratio >300 (H) <30 mg/g  Bayer DCA Hb A1c Waived  Result Value Ref Range   HB A1C (BAYER DCA - WAIVED) 7.1 (H) 4.8 - 5.6 %      Assessment & Plan:   Problem List Items Addressed This Visit       Cardiovascular and Mediastinum   Hypertensive heart/kidney disease without HF and with CKD stage III ([place])    Under good control on current regimen. Continue current regimen. Continue to monitor. Call with any concerns. Refills given. Labs drawn today.       Relevant Medications   amLODipine (NORVASC) 10 MG tablet   atorvastatin (LIPITOR) 80 MG tablet   hydrALAZINE (APRESOLINE) 100 MG tablet   hydrochlorothiazide (HYDRODIURIL) 25 MG tablet   irbesartan (AVAPRO) 300 MG tablet   metoprolol tartrate (LOPRESSOR) 25 MG tablet     Endocrine   Type 2 diabetes with nephropathy ([place]) - Primary    Doing well with A1c of 6.6. Continue current regimen. Continue to monitor. Recheck 3 months. Call with any concerns.       Relevant Medications   atorvastatin (LIPITOR) 80 MG tablet   empagliflozin (JARDIANCE) 25 MG TABS tablet   irbesartan (AVAPRO) 300 MG tablet   metFORMIN (GLUCOPHAGE-XR) 500 MG 24 hr tablet   Other Relevant Orders   Bayer DCA Hb  A1c Waived   Basic metabolic panel     Follow up plan: Return in about 3 months (around 07/07/2022).

## 2022-04-07 LAB — BASIC METABOLIC PANEL
BUN/Creatinine Ratio: 17 (ref 10–24)
BUN: 36 mg/dL — ABNORMAL HIGH (ref 8–27)
CO2: 19 mmol/L — ABNORMAL LOW (ref 20–29)
Calcium: 9.2 mg/dL (ref 8.6–10.2)
Chloride: 106 mmol/L (ref 96–106)
Creatinine, Ser: 2.09 mg/dL — ABNORMAL HIGH (ref 0.76–1.27)
Glucose: 94 mg/dL (ref 70–99)
Potassium: 4 mmol/L (ref 3.5–5.2)
Sodium: 144 mmol/L (ref 134–144)
eGFR: 36 mL/min/{1.73_m2} — ABNORMAL LOW (ref >59)

## 2022-06-20 ENCOUNTER — Other Ambulatory Visit: Payer: Self-pay | Admitting: Family Medicine

## 2022-06-21 NOTE — Telephone Encounter (Signed)
Unable to refill per protocol, Rx request is too soon. Last refill 04/06/22 for 90 and 1 refill.  Requested Prescriptions  Pending Prescriptions Disp Refills   atorvastatin (LIPITOR) 80 MG tablet [Pharmacy Med Name: ATORVASTATIN 80 MG TABLET] 90 tablet 1    Sig: TAKE 1 TABLET BY MOUTH EVERY DAY     Cardiovascular:  Antilipid - Statins Failed - 06/20/2022  1:30 AM      Failed - Lipid Panel in normal range within the last 12 months    Cholesterol, Total  Date Value Ref Range Status  01/05/2022 165 100 - 199 mg/dL Final   Cholesterol Piccolo, Waived  Date Value Ref Range Status  11/19/2014 101 <200 mg/dL Final    Comment:                            Desirable                <200                         Borderline High      200- 239                         High                     >239    LDL Chol Calc (NIH)  Date Value Ref Range Status  01/05/2022 102 (H) 0 - 99 mg/dL Final   HDL  Date Value Ref Range Status  01/05/2022 37 (L) >39 mg/dL Final   Triglycerides  Date Value Ref Range Status  01/05/2022 145 0 - 149 mg/dL Final   Triglycerides Piccolo,Waived  Date Value Ref Range Status  11/19/2014 61 <150 mg/dL Final    Comment:                            Normal                   <150                         Borderline High     150 - 199                         High                200 - 499                         Very High                >499          Passed - Patient is not pregnant      Passed - Valid encounter within last 12 months    Recent Outpatient Visits           2 months ago Type 2 diabetes with nephropathy (HCC)   [place] [facility] [place], [name], DO   5 months ago Routine general medical examination at a health care facility   [facility] [place], [place] P, DO   1 year ago Type 2 diabetes with nephropathy [facility])   [facility] [place], [name], DO  2 years ago Seasonal allergic rhinitis  due to pollen   [facility] [place], [place] P, DO   2 years ago Type 2 diabetes with nephropathy [facility])   [place] [facility] [name], DO       Future Appointments             In 2 weeks Laural Benes, Oralia Rud, DO [facility], [facility]

## 2022-07-07 ENCOUNTER — Encounter: Payer: Self-pay | Admitting: Family Medicine

## 2022-07-07 ENCOUNTER — Ambulatory Visit (INDEPENDENT_AMBULATORY_CARE_PROVIDER_SITE_OTHER): Payer: Medicaid Other | Admitting: Family Medicine

## 2022-07-07 VITALS — BP 151/90 | HR 80 | Temp 98.5°F | Ht 70.0 in | Wt 218.2 lb

## 2022-07-07 DIAGNOSIS — I1310 Hypertensive heart and chronic kidney disease without heart failure, with stage 1 through stage 4 chronic kidney disease, or unspecified chronic kidney disease: Secondary | ICD-10-CM

## 2022-07-07 DIAGNOSIS — Z7984 Long term (current) use of oral hypoglycemic drugs: Secondary | ICD-10-CM

## 2022-07-07 DIAGNOSIS — E782 Mixed hyperlipidemia: Secondary | ICD-10-CM

## 2022-07-07 DIAGNOSIS — E1121 Type 2 diabetes mellitus with diabetic nephropathy: Secondary | ICD-10-CM | POA: Diagnosis not present

## 2022-07-07 DIAGNOSIS — E559 Vitamin D deficiency, unspecified: Secondary | ICD-10-CM

## 2022-07-07 DIAGNOSIS — M10371 Gout due to renal impairment, right ankle and foot: Secondary | ICD-10-CM

## 2022-07-07 DIAGNOSIS — N183 Chronic kidney disease, stage 3 unspecified: Secondary | ICD-10-CM

## 2022-07-07 MED ORDER — HYDRALAZINE HCL 100 MG PO TABS: 100.0000 mg | ORAL_TABLET | Freq: Two times a day (BID) | ORAL | 1 refills | Status: DC

## 2022-07-07 MED ORDER — IRBESARTAN 300 MG PO TABS: 300.0000 mg | ORAL_TABLET | Freq: Every day | ORAL | 1 refills | Status: DC

## 2022-07-07 MED ORDER — EMPAGLIFLOZIN 25 MG PO TABS: 25.0000 mg | ORAL_TABLET | Freq: Every day | ORAL | 1 refills | Status: DC

## 2022-07-07 MED ORDER — HYDROCHLOROTHIAZIDE 25 MG PO TABS: 25.0000 mg | ORAL_TABLET | Freq: Every day | ORAL | 1 refills | Status: DC

## 2022-07-07 MED ORDER — METOPROLOL TARTRATE 25 MG PO TABS: 25.0000 mg | ORAL_TABLET | Freq: Two times a day (BID) | ORAL | 1 refills | Status: DC

## 2022-07-07 MED ORDER — ALLOPURINOL 100 MG PO TABS: 100.0000 mg | ORAL_TABLET | Freq: Every day | ORAL | 1 refills | Status: DC

## 2022-07-07 MED ORDER — ATORVASTATIN CALCIUM 80 MG PO TABS: 80.0000 mg | ORAL_TABLET | Freq: Every day | ORAL | 1 refills | Status: DC

## 2022-07-07 MED ORDER — AMLODIPINE BESYLATE 10 MG PO TABS: 10.0000 mg | ORAL_TABLET | Freq: Every day | ORAL | 1 refills | Status: DC

## 2022-07-07 MED ORDER — METFORMIN HCL ER 500 MG PO TB24: 1000.0000 mg | ORAL_TABLET | Freq: Two times a day (BID) | ORAL | 1 refills | Status: DC

## 2022-07-07 NOTE — Assessment & Plan Note (Addendum)
Running a little high, but good at home. Will continue current regimen. Call with any concerns. Recheck 3 months.

## 2022-07-07 NOTE — Progress Notes (Signed)
BP (!) 151/90 (BP Location: Left Arm, Cuff Size: Normal)   Pulse 80   Temp 98.5 F (36.9 C) (Oral)   Ht 5\' 10"  (1.778 m)   Wt 218 lb 3.2 oz (99 kg)   SpO2 95%   BMI 31.31 kg/m    Subjective:    Patient ID: Anthony Perches., male    DOB: 01-17-1961, 62 y.o.   MRN: [id number]  HPI: Anthony Sellers. is a 62 y.o. male  Chief Complaint  Patient presents with   Diabetes   DIABETES Hypoglycemic episodes:no Polydipsia/polyuria: no Visual disturbance: no Chest pain: no Paresthesias: no Glucose Monitoring: no  Accucheck frequency: Not Checking Taking Insulin?: no Blood Pressure Monitoring: not checking Retinal Examination: unsure Foot Exam: Up to Date Diabetic Education: Completed Pneumovax: Up to Date Influenza: Up to Date Aspirin: yes  Not gout flares. Tolerating medicine well.  HYPERTENSION / HYPERLIPIDEMIA Satisfied with current treatment? yes Duration of hypertension: chronic BP monitoring frequency: not checking BP medication side effects: no Past BP meds: amlodipine, HCTZ, metoprolol, hydralazine, irbesartan Duration of hyperlipidemia: chronic Cholesterol medication side effects: no Cholesterol supplements: none Past cholesterol medications: atorvastatin Medication compliance: excellent compliance Aspirin: yes Recent stressors: no Recurrent headaches: no Visual changes: no Palpitations: no Dyspnea: no Chest pain: no Lower extremity edema: no Dizzy/lightheaded: no  Relevant past medical, surgical, family and social history reviewed and updated as indicated. Interim medical history since our last visit reviewed. Allergies and medications reviewed and updated.  Review of Systems  Constitutional: Negative.   Respiratory: Negative.    Cardiovascular: Negative.   Gastrointestinal: Negative.   Musculoskeletal: Negative.   Psychiatric/Behavioral: Negative.      Per HPI unless specifically indicated above     Objective:    BP (!) 151/90 (BP  Location: Left Arm, Cuff Size: Normal)   Pulse 80   Temp 98.5 F (36.9 C) (Oral)   Ht 5\' 10"  (1.778 m)   Wt 218 lb 3.2 oz (99 kg)   SpO2 95%   BMI 31.31 kg/m   Wt Readings from Last 3 Encounters:  07/07/22 218 lb 3.2 oz (99 kg)  04/06/22 218 lb 4.8 oz (99 kg)  01/05/22 224 lb 1.6 oz (101.7 kg)    Physical Exam Vitals and nursing note reviewed.  Constitutional:      General: He is not in acute distress.    Appearance: Normal appearance. He is not ill-appearing, toxic-appearing or diaphoretic.  HENT:     Head: Normocephalic and atraumatic.     Right Ear: External ear normal.     Left Ear: External ear normal.     Nose: Nose normal.     Mouth/Throat:     Mouth: Mucous membranes are moist.     Pharynx: Oropharynx is clear.  Eyes:     General: No scleral icterus.       Right eye: No discharge.        Left eye: No discharge.     Extraocular Movements: Extraocular movements intact.     Conjunctiva/sclera: Conjunctivae normal.     Pupils: Pupils are equal, round, and reactive to light.  Cardiovascular:     Rate and Rhythm: Normal rate and regular rhythm.     Pulses: Normal pulses.     Heart sounds: Normal heart sounds. No murmur heard.    No friction rub. No gallop.  Pulmonary:     Effort: Pulmonary effort is normal. No respiratory distress.     Breath sounds: Normal breath  sounds. No stridor. No wheezing, rhonchi or rales.  Chest:     Chest wall: No tenderness.  Musculoskeletal:        General: Normal range of motion.     Cervical back: Normal range of motion and neck supple.  Skin:    General: Skin is warm and dry.     Capillary Refill: Capillary refill takes less than 2 seconds.     Coloration: Skin is not jaundiced or pale.     Findings: No bruising, erythema, lesion or rash.  Neurological:     General: No focal deficit present.     Mental Status: He is alert and oriented to person, place, and time. Mental status is at baseline.  Psychiatric:        Mood and  Affect: Mood normal.        Behavior: Behavior normal.        Thought Content: Thought content normal.        Judgment: Judgment normal.     Results for orders placed or performed in visit on 04/06/22  Bayer DCA Hb A1c Waived  Result Value Ref Range   HB A1C (BAYER DCA - WAIVED) 6.6 (H) 4.8 - 5.6 %  Basic metabolic panel  Result Value Ref Range   Glucose 94 70 - 99 mg/dL   BUN 36 (H) 8 - 27 mg/dL   Creatinine, Ser 1.61 (H) 0.76 - 1.27 mg/dL   eGFR 36 (L) >09 UE/AVW/0.98   BUN/Creatinine Ratio 17 10 - 24   Sodium 144 134 - 144 mmol/L   Potassium 4.0 3.5 - 5.2 mmol/L   Chloride 106 96 - 106 mmol/L   CO2 19 (L) 20 - 29 mmol/L   Calcium 9.2 8.6 - 10.2 mg/dL      Assessment & Plan:   Problem List Items Addressed This Visit       Cardiovascular and Mediastinum   Hypertensive heart/kidney disease without HF and with CKD stage III (HCC)    Running a little high, but good at home. Will continue current regimen. Call with any concerns. Recheck 3 months.        Relevant Medications   amLODipine (NORVASC) 10 MG tablet   atorvastatin (LIPITOR) 80 MG tablet   hydrALAZINE (APRESOLINE) 100 MG tablet   hydrochlorothiazide (HYDRODIURIL) 25 MG tablet   irbesartan (AVAPRO) 300 MG tablet   metoprolol tartrate (LOPRESSOR) 25 MG tablet   Other Relevant Orders   CBC with Differential/Platelet   Comprehensive metabolic panel     Endocrine   Type 2 diabetes with nephropathy (HCC) - Primary    Stable with A1c of 6.9. Continue current regimen. Continue to monitor. Call with any concerns.       Relevant Medications   atorvastatin (LIPITOR) 80 MG tablet   empagliflozin (JARDIANCE) 25 MG TABS tablet   irbesartan (AVAPRO) 300 MG tablet   metFORMIN (GLUCOPHAGE-XR) 500 MG 24 hr tablet   Other Relevant Orders   CBC with Differential/Platelet   Bayer DCA Hb A1c Waived   Comprehensive metabolic panel     Musculoskeletal and Integument   Acute gout due to renal impairment involving toe of  right foot    Under good control on current regimen. Continue current regimen. Continue to monitor. Call with any concerns. Refills given. Labs drawn today.       Relevant Medications   allopurinol (ZYLOPRIM) 100 MG tablet   Other Relevant Orders   CBC with Differential/Platelet   Comprehensive metabolic panel   Uric  acid     Other   Hyperlipidemia    Under good control on current regimen. Continue current regimen. Continue to monitor. Call with any concerns. Refills given. Labs drawn today.       Relevant Medications   amLODipine (NORVASC) 10 MG tablet   atorvastatin (LIPITOR) 80 MG tablet   hydrALAZINE (APRESOLINE) 100 MG tablet   hydrochlorothiazide (HYDRODIURIL) 25 MG tablet   irbesartan (AVAPRO) 300 MG tablet   metoprolol tartrate (LOPRESSOR) 25 MG tablet   Other Relevant Orders   CBC with Differential/Platelet   Comprehensive metabolic panel   Lipid Panel w/o Chol/HDL Ratio   Vitamin D deficiency    Rechecking labs today. Await results. Treat as needed.       Relevant Orders   CBC with Differential/Platelet   Comprehensive metabolic panel   VITAMIN D 25 Hydroxy (Vit-D Deficiency, Fractures)     Follow up plan: Return in about 3 months (around 10/07/2022).

## 2022-07-07 NOTE — Assessment & Plan Note (Signed)
Rechecking labs today. Await results. Treat as needed.  °

## 2022-07-07 NOTE — Assessment & Plan Note (Signed)
Under good control on current regimen. Continue current regimen. Continue to monitor. Call with any concerns. Refills given. Labs drawn today.   

## 2022-07-07 NOTE — Assessment & Plan Note (Signed)
Stable with A1c of 6.9. Continue current regimen. Continue to monitor. Call with any concerns.

## 2022-07-08 LAB — COMPREHENSIVE METABOLIC PANEL
ALT: 12 IU/L (ref 0–44)
AST: 18 IU/L (ref 0–40)
Albumin: 4.4 g/dL (ref 3.8–4.9)
Alkaline Phosphatase: 94 IU/L (ref 44–121)
BUN/Creatinine Ratio: 12 (ref 10–24)
BUN: 29 mg/dL — ABNORMAL HIGH (ref 8–27)
Bilirubin Total: 0.5 mg/dL (ref 0.0–1.2)
CO2: 22 mmol/L (ref 20–29)
Calcium: 9.3 mg/dL (ref 8.6–10.2)
Chloride: 106 mmol/L (ref 96–106)
Creatinine, Ser: 2.34 mg/dL — ABNORMAL HIGH (ref 0.76–1.27)
Globulin, Total: 2.4 g/dL (ref 1.5–4.5)
Glucose: 102 mg/dL — ABNORMAL HIGH (ref 70–99)
Potassium: 4 mmol/L (ref 3.5–5.2)
Sodium: 144 mmol/L (ref 134–144)
Total Protein: 6.8 g/dL (ref 6.0–8.5)
eGFR: 31 mL/min/{1.73_m2} — ABNORMAL LOW (ref >59)

## 2022-07-08 LAB — CBC WITH DIFFERENTIAL/PLATELET
Basophils Absolute: 0 10*3/uL (ref 0.0–0.2)
Basos: 1 %
EOS (ABSOLUTE): 0.5 10*3/uL — ABNORMAL HIGH (ref 0.0–0.4)
Eos: 7 %
Hematocrit: 39.6 % (ref 37.5–51.0)
Hemoglobin: 13.2 g/dL (ref 13.0–17.7)
Immature Grans (Abs): 0 10*3/uL (ref 0.0–0.1)
Immature Granulocytes: 0 %
Lymphocytes Absolute: 1.5 10*3/uL (ref 0.7–3.1)
Lymphs: 19 %
MCH: 27.4 pg (ref 26.6–33.0)
MCHC: 33.3 g/dL (ref 31.5–35.7)
MCV: 82 fL (ref 79–97)
Monocytes Absolute: 0.6 10*3/uL (ref 0.1–0.9)
Monocytes: 8 %
Neutrophils Absolute: 5 10*3/uL (ref 1.4–7.0)
Neutrophils: 65 %
Platelets: 238 10*3/uL (ref 150–450)
RBC: 4.82 x10E6/uL (ref 4.14–5.80)
RDW: 13.7 % (ref 11.6–15.4)
WBC: 7.7 10*3/uL (ref 3.4–10.8)

## 2022-07-08 LAB — LIPID PANEL W/O CHOL/HDL RATIO
Cholesterol, Total: 162 mg/dL (ref 100–199)
HDL: 40 mg/dL (ref >39)
LDL Chol Calc (NIH): 100 mg/dL — ABNORMAL HIGH (ref 0–99)
Triglycerides: 121 mg/dL (ref 0–149)
VLDL Cholesterol Cal: 22 mg/dL (ref 5–40)

## 2022-07-08 LAB — VITAMIN D 25 HYDROXY (VIT D DEFICIENCY, FRACTURES): Vit D, 25-Hydroxy: 17.6 ng/mL — ABNORMAL LOW (ref 30.0–100.0)

## 2022-07-08 LAB — BAYER DCA HB A1C WAIVED: HB A1C (BAYER DCA - WAIVED): 6.9 % — ABNORMAL HIGH (ref 4.8–5.6)

## 2022-07-08 LAB — URIC ACID: Uric Acid: 8.2 mg/dL (ref 3.8–8.4)

## 2022-07-10 ENCOUNTER — Telehealth: Payer: Self-pay

## 2022-07-10 ENCOUNTER — Other Ambulatory Visit: Payer: Self-pay | Admitting: Family Medicine

## 2022-07-10 NOTE — Telephone Encounter (Signed)
Spoke with patient's wife Harriett Sine and was informed that patient has not had a recent Diabetic Eye Exam since about two years ago. Patient last Diabetic Eye Exam was requested from [facility].

## 2022-07-10 NOTE — Telephone Encounter (Signed)
-----   Message from [name], [place] sent at 07/07/2022  4:50 PM EDT ----- Call wife to see who his eye doctor is and if he's had an exam this year

## 2022-07-13 ENCOUNTER — Ambulatory Visit: Payer: Self-pay

## 2022-07-13 NOTE — Telephone Encounter (Signed)
  Chief Complaint: Medication question Symptoms: none Frequency:  Pertinent Negatives: Patient denies  Disposition: [] ED /[] Urgent Care (no appt availability in office) / [] Appointment(In office/virtual)/ []  [place] [facility]/ [] Home Care/ [] Refused Recommended Disposition /[] [place] [facility]/ [x]  Follow-up with PCP Additional Notes: Spoke with pt's wife, Anthony Sellers. She is concerned about pt's diabetes medication being discontinued. She is wondering if there is another medication he can take or what else can be done to help pt control blood sugar.  Pt has upcoming appts with both kidney specialist and Dr. Laural Benes. Please advise.    Summary: med management   The patient's wife is calling and requesting to speak with a nurse. Dr. Laural Benes advised him to stop taking two of his medications, one of which is insulin, and she is very concerned.  Anthony Sellers is requesting a callback.  Seeking clinical advice.      [name], DO 07/10/2022  4:46 PM EDT     Please let him know that his kidneys got worse. When is he seeing his kidney doctor again? Because he needs to see him ASAP. I also need him to stop the hydrochlorothiazide and metformin and come see me again in 2 weeks.   Reason for Disposition  [1] Caller requesting NON-URGENT health information AND [2] PCP's office is the best resource  Answer Assessment - Initial Assessment Questions 1. REASON FOR CALL or QUESTION: "What is your reason for calling today?" or "How can I best help you?" or "What question do you have that I can help answer?"     Wife is concerned about pt's diabetes as he has been taken off his diabetes medication.  Protocols used: Information Only Call - No Triage-A-AH

## 2022-07-13 NOTE — Telephone Encounter (Signed)
We will recheck his kidney function at his appointment on 7/1 and determine what sugar medicine we can restart at that time.

## 2022-07-13 NOTE — Telephone Encounter (Signed)
Left message for patient's wife Harriett Sine advising her to give our office a call back to discuss Dr Henriette Combs recommendations in regards to the previous telephone encounter.   OK for [facility] to give note if patient calls back.

## 2022-07-17 ENCOUNTER — Other Ambulatory Visit: Payer: Self-pay | Admitting: Nephrology

## 2022-07-17 DIAGNOSIS — N1832 Chronic kidney disease, stage 3b: Secondary | ICD-10-CM

## 2022-07-24 ENCOUNTER — Other Ambulatory Visit: Payer: Self-pay | Admitting: Family Medicine

## 2022-07-25 ENCOUNTER — Encounter: Payer: Self-pay | Admitting: Family Medicine

## 2022-07-25 ENCOUNTER — Ambulatory Visit (INDEPENDENT_AMBULATORY_CARE_PROVIDER_SITE_OTHER): Payer: PRIVATE HEALTH INSURANCE | Admitting: Family Medicine

## 2022-07-25 ENCOUNTER — Other Ambulatory Visit: Payer: Self-pay | Admitting: Family Medicine

## 2022-07-25 VITALS — BP 178/106 | HR 79 | Wt 222.2 lb

## 2022-07-25 DIAGNOSIS — I1310 Hypertensive heart and chronic kidney disease without heart failure, with stage 1 through stage 4 chronic kidney disease, or unspecified chronic kidney disease: Secondary | ICD-10-CM

## 2022-07-25 DIAGNOSIS — N1832 Chronic kidney disease, stage 3b: Secondary | ICD-10-CM

## 2022-07-25 DIAGNOSIS — E1121 Type 2 diabetes mellitus with diabetic nephropathy: Secondary | ICD-10-CM | POA: Diagnosis not present

## 2022-07-25 DIAGNOSIS — N183 Chronic kidney disease, stage 3 unspecified: Secondary | ICD-10-CM

## 2022-07-25 MED ORDER — HYDRALAZINE HCL 100 MG PO TABS: 100.0000 mg | ORAL_TABLET | Freq: Three times a day (TID) | ORAL | 1 refills | Status: DC

## 2022-07-25 MED ORDER — RYBELSUS 3 MG PO TABS: 3.0000 mg | ORAL_TABLET | Freq: Every day | ORAL | 0 refills | Status: DC

## 2022-07-25 MED ORDER — RYBELSUS 7 MG PO TABS
7.0000 mg | ORAL_TABLET | Freq: Every day | ORAL | 1 refills | Status: DC
Start: 2022-07-25 — End: 2023-01-08

## 2022-07-25 NOTE — Telephone Encounter (Signed)
Rx was sent to [id number]  on 07/07/22 #90/1 RF.   Requested Prescriptions  Pending Prescriptions Disp Refills   atorvastatin (LIPITOR) 80 MG tablet [Pharmacy Med Name: ATORVASTATIN 80 MG TABLET] 90 tablet 1    Sig: TAKE 1 TABLET BY MOUTH EVERY DAY     Cardiovascular:  Antilipid - Statins Failed - 07/24/2022 11:22 AM      Failed - Lipid Panel in normal range within the last 12 months    Cholesterol, Total  Date Value Ref Range Status  07/07/2022 162 100 - 199 mg/dL Final   Cholesterol Piccolo, Waived  Date Value Ref Range Status  11/19/2014 101 <200 mg/dL Final    Comment:                            Desirable                <200                         Borderline High      200- 239                         High                     >239    LDL Chol Calc (NIH)  Date Value Ref Range Status  07/07/2022 100 (H) 0 - 99 mg/dL Final   HDL  Date Value Ref Range Status  07/07/2022 40 >39 mg/dL Final   Triglycerides  Date Value Ref Range Status  07/07/2022 121 0 - 149 mg/dL Final   Triglycerides Piccolo,Waived  Date Value Ref Range Status  11/19/2014 61 <150 mg/dL Final    Comment:                            Normal                   <150                         Borderline High     150 - 199                         High                200 - 499                         Very High                >499          Passed - Patient is not pregnant      Passed - Valid encounter within last 12 months    Recent Outpatient Visits           2 weeks ago Type 2 diabetes with nephropathy (HCC)   [place] [facility] [place], [name], DO   3 months ago Type 2 diabetes with nephropathy [facility])   [facility] [place], [name], DO   6 months ago Routine general medical examination at a health care facility   [facility] [place], [place] P, DO   1 year ago Type 2 diabetes  with nephropathy [facility])   [place] [facility] [place], [place] P, DO   2 years ago Seasonal allergic rhinitis due to pollen   [facility] [name], DO       Future Appointments             Today [name], DO [place] [facility], [facility]   In 2 months Laural Benes, Oralia Rud, DO  [facility], [facility]

## 2022-07-25 NOTE — Progress Notes (Signed)
BP (!) 178/106   Pulse 79   Wt 222 lb 3.2 oz (100.8 kg)   SpO2 96%   BMI 31.88 kg/m    Subjective:    Patient ID: Anthony Sellers., male    DOB: 07-22-1960, 62 y.o.   MRN: [id number]  HPI: [name]. is a 62 y.o. male  Chief Complaint  Patient presents with   Hypertension   Diabetes   HYPERTENSION with Chronic Kidney Disease Hypertension status: uncontrolled  Satisfied with current treatment? no Duration of hypertension: chronic BP monitoring frequency:  rarely BP medication side effects:  no Medication compliance: good compliance Previous BP meds: amlodipine, hydralazine, irbesartan, metoprolol Aspirin: no Recurrent headaches: no Visual changes: no Palpitations: no Dyspnea: no Chest pain: no Lower extremity edema: no Dizzy/lightheaded: no  DIABETES Hypoglycemic episodes:no Polydipsia/polyuria: no Visual disturbance: no Chest pain: no Paresthesias: no Glucose Monitoring: no Taking Insulin?: no Blood Pressure Monitoring: not checking Retinal Examination: Not up to Date Foot Exam: Up to Date Diabetic Education: Completed Pneumovax: Up to Date Influenza: Up to Date Aspirin: yes   Relevant past medical, surgical, family and social history reviewed and updated as indicated. Interim medical history since our last visit reviewed. Allergies and medications reviewed and updated.  Review of Systems  Constitutional: Negative.   Respiratory: Negative.    Cardiovascular: Negative.   Gastrointestinal: Negative.   Musculoskeletal: Negative.   Neurological: Negative.   Psychiatric/Behavioral: Negative.      Per HPI unless specifically indicated above     Objective:    BP (!) 178/106   Pulse 79   Wt 222 lb 3.2 oz (100.8 kg)   SpO2 96%   BMI 31.88 kg/m   Wt Readings from Last 3 Encounters:  07/25/22 222 lb 3.2 oz (100.8 kg)  07/07/22 218 lb 3.2 oz (99 kg)  04/06/22 218 lb 4.8 oz (99 kg)    Physical Exam Vitals and nursing note reviewed.   Constitutional:      General: He is not in acute distress.    Appearance: Normal appearance. He is not ill-appearing, toxic-appearing or diaphoretic.  HENT:     Head: Normocephalic and atraumatic.     Right Ear: External ear normal.     Left Ear: External ear normal.     Nose: Nose normal.     Mouth/Throat:     Mouth: Mucous membranes are moist.     Pharynx: Oropharynx is clear.  Eyes:     General: No scleral icterus.       Right eye: No discharge.        Left eye: No discharge.     Extraocular Movements: Extraocular movements intact.     Conjunctiva/sclera: Conjunctivae normal.     Pupils: Pupils are equal, round, and reactive to light.  Cardiovascular:     Rate and Rhythm: Normal rate and regular rhythm.     Pulses: Normal pulses.     Heart sounds: Normal heart sounds. No murmur heard.    No friction rub. No gallop.  Pulmonary:     Effort: Pulmonary effort is normal. No respiratory distress.     Breath sounds: Normal breath sounds. No stridor. No wheezing, rhonchi or rales.  Chest:     Chest wall: No tenderness.  Musculoskeletal:        General: Normal range of motion.     Cervical back: Normal range of motion and neck supple.  Skin:    General: Skin is warm and dry.  Capillary Refill: Capillary refill takes less than 2 seconds.     Coloration: Skin is not jaundiced or pale.     Findings: No bruising, erythema, lesion or rash.  Neurological:     General: No focal deficit present.     Mental Status: He is alert and oriented to person, place, and time. Mental status is at baseline.  Psychiatric:        Mood and Affect: Mood normal.        Behavior: Behavior normal.        Thought Content: Thought content normal.        Judgment: Judgment normal.     Results for orders placed or performed in visit on 07/07/22  CBC with Differential/Platelet  Result Value Ref Range   WBC 7.7 3.4 - 10.8 x10E3/uL   RBC 4.82 4.14 - 5.80 x10E6/uL   Hemoglobin 13.2 13.0 - 17.7 g/dL    Hematocrit 10.2 72.5 - 51.0 %   MCV 82 79 - 97 fL   MCH 27.4 26.6 - 33.0 pg   MCHC 33.3 31.5 - 35.7 g/dL   RDW 36.6 44.0 - 34.7 %   Platelets 238 150 - 450 x10E3/uL   Neutrophils 65 Not Estab. %   Lymphs 19 Not Estab. %   Monocytes 8 Not Estab. %   Eos 7 Not Estab. %   Basos 1 Not Estab. %   Neutrophils Absolute 5.0 1.4 - 7.0 x10E3/uL   Lymphocytes Absolute 1.5 0.7 - 3.1 x10E3/uL   Monocytes Absolute 0.6 0.1 - 0.9 x10E3/uL   EOS (ABSOLUTE) 0.5 (H) 0.0 - 0.4 x10E3/uL   Basophils Absolute 0.0 0.0 - 0.2 x10E3/uL   Immature Granulocytes 0 Not Estab. %   Immature Grans (Abs) 0.0 0.0 - 0.1 x10E3/uL  Bayer DCA Hb A1c Waived  Result Value Ref Range   HB A1C (BAYER DCA - WAIVED) 6.9 (H) 4.8 - 5.6 %  Comprehensive metabolic panel  Result Value Ref Range   Glucose 102 (H) 70 - 99 mg/dL   BUN 29 (H) 8 - 27 mg/dL   Creatinine, Ser 4.25 (H) 0.76 - 1.27 mg/dL   eGFR 31 (L) >95 GL/OVF/6.43   BUN/Creatinine Ratio 12 10 - 24   Sodium 144 134 - 144 mmol/L   Potassium 4.0 3.5 - 5.2 mmol/L   Chloride 106 96 - 106 mmol/L   CO2 22 20 - 29 mmol/L   Calcium 9.3 8.6 - 10.2 mg/dL   Total Protein 6.8 6.0 - 8.5 g/dL   Albumin 4.4 3.8 - 4.9 g/dL   Globulin, Total 2.4 1.5 - 4.5 g/dL   Bilirubin Total 0.5 0.0 - 1.2 mg/dL   Alkaline Phosphatase 94 44 - 121 IU/L   AST 18 0 - 40 IU/L   ALT 12 0 - 44 IU/L  Lipid Panel w/o Chol/HDL Ratio  Result Value Ref Range   Cholesterol, Total 162 100 - 199 mg/dL   Triglycerides 329 0 - 149 mg/dL   HDL 40 >51 mg/dL   VLDL Cholesterol Cal 22 5 - 40 mg/dL   LDL Chol Calc (NIH) 884 (H) 0 - 99 mg/dL  Uric acid  Result Value Ref Range   Uric Acid 8.2 3.8 - 8.4 mg/dL  VITAMIN D 25 Hydroxy (Vit-D Deficiency, Fractures)  Result Value Ref Range   Vit D, 25-Hydroxy 17.6 (L) 30.0 - 100.0 ng/mL      Assessment & Plan:   Problem List Items Addressed This Visit  Cardiovascular and Mediastinum   Hypertensive heart/kidney disease without HF and with CKD stage  III (HCC)    Not under good control. Will increase his hydralazine to TID and recheck in about a month. Call with any concerns.       Relevant Medications   hydrALAZINE (APRESOLINE) 100 MG tablet     Endocrine   Type 2 diabetes with nephropathy (HCC) - Primary    Off metformin due to renal function. Previously had bad reaction to jardiance where he passed out. Will start him on rybelsus. Recheck 3 months. Call with any concerns.       Relevant Medications   Semaglutide (RYBELSUS) 3 MG TABS   Semaglutide (RYBELSUS) 7 MG TABS     Genitourinary   Stage 3 chronic kidney disease    Following with nephrology. Continue to monitor.         Follow up plan: Return in about 4 weeks (around 08/22/2022).

## 2022-07-26 ENCOUNTER — Ambulatory Visit: Payer: Self-pay | Admitting: *Deleted

## 2022-07-26 ENCOUNTER — Ambulatory Visit
Admission: RE | Admit: 2022-07-26 | Discharge: 2022-07-26 | Disposition: A | Discharge status: Home / Self Care | Payer: PRIVATE HEALTH INSURANCE | Source: Ambulatory Visit | Attending: Nephrology | Admitting: Nephrology

## 2022-07-26 DIAGNOSIS — E1122 Type 2 diabetes mellitus with diabetic chronic kidney disease: Secondary | ICD-10-CM | POA: Diagnosis present

## 2022-07-26 DIAGNOSIS — N1832 Chronic kidney disease, stage 3b: Secondary | ICD-10-CM | POA: Diagnosis present

## 2022-07-26 NOTE — Telephone Encounter (Signed)
Rx 07/07/22 #90 1RF- should be on file- too soon Requested Prescriptions  Pending Prescriptions Disp Refills   irbesartan (AVAPRO) 300 MG tablet [Pharmacy Med Name: IRBESARTAN 300 MG TABLET] 90 tablet 1    Sig: TAKE 1 TABLET BY MOUTH EVERY DAY     Cardiovascular:  Angiotensin Receptor Blockers Failed - 07/25/2022  9:48 PM      Failed - Cr in normal range and within 180 days    Creatinine, Ser  Date Value Ref Range Status  07/07/2022 2.34 (H) 0.76 - 1.27 mg/dL Final         Failed - Last BP in normal range    BP Readings from Last 1 Encounters:  07/25/22 (!) 178/106         Passed - K in normal range and within 180 days    Potassium  Date Value Ref Range Status  07/07/2022 4.0 3.5 - 5.2 mmol/L Final         Passed - Patient is not pregnant      Passed - Valid encounter within last 6 months    Recent Outpatient Visits           Yesterday    [place] [facility] [place], [place] P, DO   2 weeks ago Type 2 diabetes with nephropathy [facility])   [place] [facility] [place], [name], DO   3 months ago Type 2 diabetes with nephropathy [facility])   [place] [facility] [place], [name], DO   6 months ago Routine general medical examination at a health care facility   [facility] [place], [place] P, DO   1 year ago Type 2 diabetes with nephropathy [facility])   [place] [facility] [name], DO       Future Appointments             In 2 months Laural Benes, Oralia Rud, DO [place] [facility], [facility]

## 2022-07-26 NOTE — Telephone Encounter (Signed)
Message from Randol Kern sent at 07/26/2022 10:05 AM EDT  Summary: Medication questions, pt is unsure of what he should be taking   [name] called reporting that the patient does not know which medicines he should be taking. She is requesting to speak to the clinic.  ----- Message from Randol Kern sent at 07/26/2022 10:05 AM EDT ----- [name] called reporting that the patient does not know which medicines he should be taking. She is requesting to speak to the clinic.          Call History   Type Contact Phone/Fax User  07/26/2022 10:05 AM EDT Phone (Incoming) Jennette Dubin (Emergency Contact) [phone] Pilar Plate    Reason for Disposition  [1] Caller has URGENT medicine question about med that PCP or specialist prescribed AND [2] triager unable to answer question    Needing to know if the metformin was discontinued.   It's no longer on his medication list.   Dr. Henriette Combs note is not complete so unable to answer this question.  Went over all his medications with wife and clarified the names and dosages.  Answer Assessment - Initial Assessment Questions 1. NAME of MEDICINE: "What medicine(s) are you calling about?"     Returned wife's call.  He was taken off 2 medicines by Dr Laural Benes.   I'm not sure which 2 medicines were stopped.    I fill his pill box so I wanted to be sure I have all his medication correct.      I want to go over his medications 2. QUESTION: "What is your question?" (e.g., double dose of medicine, side effect)     I want to go over all his medications.   I need clarification especially for the metformin.   Did she stop the metformin?  And what was the other new medication she started him on?    I went through all his medications with wife, Dalon Blattel.   I let her know the hydralazine was changed from 2 times a day to 3 times a day.     I let her know [place] Pepper was stopped.    Rybelsus 3 mg to be taken once a day for 30 days then to start  Rybelsus 7 mg once a day after the 30 days were complete and continue at 7 mg once a day.   The metformin was not on his medication list.  Was this stopped?    Dr. Laural Benes has not completed her office note so I wasn't able to determine the order for the metformin other than it's not on his medication list.  3. PRESCRIBER: "Who prescribed the medicine?" Reason: if prescribed by specialist, call should be referred to that group.     Dr. Laural Benes 4. SYMPTOMS: "Do you have any symptoms?" If Yes, ask: "What symptoms are you having?"  "How bad are the symptoms (e.g., mild, moderate, severe)     N/A 5. PREGNANCY:  "Is there any chance that you are pregnant?" "When was your last menstrual period?"     N/A  Protocols used: Medication Question Call-A-AH

## 2022-07-26 NOTE — Telephone Encounter (Signed)
Called and spoke with pt wife Harriett Sine and informed her of pts current medication list.

## 2022-07-26 NOTE — Telephone Encounter (Signed)
He has been off metformin since his appointment in June. OK to print med list for her

## 2022-07-26 NOTE — Telephone Encounter (Signed)
  Chief Complaint: Wife, Anthony Sellers wanted to go over his medication list since Dr. Laural Benes recently made some changes.   This was done.    Her office note is not complete.   There is a question as to whether he is to continue the metformin or was it discontinued.   It's no longer on the medication list but do not see anything about it being discontinued. Symptoms: Has the metformin been discontinued? Frequency: N/A Pertinent Negatives: Patient denies N/A Disposition: [] ED /[] Urgent Care (no appt availability in office) / [] Appointment(In office/virtual)/ []  [place] [facility]/ [] Home Care/ [] Refused Recommended Disposition /[] [place] [facility]/ [x]  Follow-up with PCP Additional Notes: I let Harriett Sine, wife know that someone would call her back regarding the metformin.  Call on her cell phone at [phone].  The home number is not in service [phone])  Message sent to Dr. Olevia Perches

## 2022-07-28 ENCOUNTER — Encounter: Payer: Self-pay | Admitting: Family Medicine

## 2022-07-28 NOTE — Assessment & Plan Note (Signed)
Not under good control. Will increase his hydralazine to TID and recheck in about a month. Call with any concerns.

## 2022-07-28 NOTE — Assessment & Plan Note (Signed)
Following with nephrology.  Continue to monitor.  

## 2022-07-28 NOTE — Assessment & Plan Note (Addendum)
Off metformin due to renal function. Previously had bad reaction to jardiance where he passed out. Will start him on rybelsus. Recheck 3 months. Call with any concerns.

## 2022-09-06 ENCOUNTER — Encounter: Payer: Self-pay | Admitting: Family Medicine

## 2022-10-09 ENCOUNTER — Encounter: Payer: Self-pay | Admitting: Family Medicine

## 2022-10-09 ENCOUNTER — Ambulatory Visit: Payer: PRIVATE HEALTH INSURANCE | Admitting: Family Medicine

## 2022-10-09 VITALS — BP 151/82 | HR 76 | Temp 97.8°F | Wt 229.8 lb

## 2022-10-09 DIAGNOSIS — I1310 Hypertensive heart and chronic kidney disease without heart failure, with stage 1 through stage 4 chronic kidney disease, or unspecified chronic kidney disease: Secondary | ICD-10-CM | POA: Diagnosis not present

## 2022-10-09 DIAGNOSIS — N183 Chronic kidney disease, stage 3 unspecified: Secondary | ICD-10-CM | POA: Diagnosis not present

## 2022-10-09 DIAGNOSIS — Z7984 Long term (current) use of oral hypoglycemic drugs: Secondary | ICD-10-CM | POA: Diagnosis not present

## 2022-10-09 DIAGNOSIS — E1121 Type 2 diabetes mellitus with diabetic nephropathy: Secondary | ICD-10-CM | POA: Diagnosis not present

## 2022-10-09 MED ORDER — HYDRALAZINE HCL 100 MG PO TABS: 100.0000 mg | ORAL_TABLET | Freq: Three times a day (TID) | ORAL | 1 refills | Status: DC

## 2022-10-09 MED ORDER — HYDRALAZINE HCL 25 MG PO TABS
25.0000 mg | ORAL_TABLET | Freq: Three times a day (TID) | ORAL | 1 refills | Status: DC
Start: 2022-10-09 — End: 2023-01-04

## 2022-10-09 NOTE — Assessment & Plan Note (Signed)
Will recheck levels today and adjust rybelsus dose as needed. Call with any concerns.

## 2022-10-09 NOTE — Progress Notes (Signed)
BP (!) 151/82   Pulse 76   Temp 97.8 F (36.6 C) (Oral)   Wt 229 lb 12.8 oz (104.2 kg)   SpO2 97%   BMI 32.97 kg/m    Subjective:    Patient ID: Anthony Sellers., male    DOB: [birth year], 62 y.o.   MRN: [id number]  HPI: [name]. is a 62 y.o. male  Chief Complaint  Patient presents with   Diabetes   DIABETES Hypoglycemic episodes:no Polydipsia/polyuria: no Visual disturbance: no Chest pain: no Paresthesias: no Glucose Monitoring: no  Accucheck frequency: Not Checking Taking Insulin?: no Blood Pressure Monitoring: rarely Retinal Examination: Not up to Date Foot Exam: Up to Date Diabetic Education: Completed Pneumovax: Up to Date Influenza:  Declined Aspirin: yes  HYPERTENSION with Chronic Kidney Disease Hypertension status: uncontrolled  Satisfied with current treatment? no Duration of hypertension: chronic BP monitoring frequency:  rarely BP range:  BP medication side effects:  no Medication compliance: good compliance Previous BP meds:hyralazine, amlodipine, irbesartan, metoprolol Aspirin: yes Recurrent headaches: no Visual changes: no Palpitations: no Dyspnea: no Chest pain: no Lower extremity edema: no Dizzy/lightheaded: no  Relevant past medical, surgical, family and social history reviewed and updated as indicated. Interim medical history since our last visit reviewed. Allergies and medications reviewed and updated.  Review of Systems  Constitutional: Negative.   Respiratory: Negative.    Cardiovascular: Negative.   Gastrointestinal: Negative.   Musculoskeletal: Negative.   Neurological: Negative.   Psychiatric/Behavioral: Negative.      Per HPI unless specifically indicated above     Objective:    BP (!) 151/82   Pulse 76   Temp 97.8 F (36.6 C) (Oral)   Wt 229 lb 12.8 oz (104.2 kg)   SpO2 97%   BMI 32.97 kg/m   Wt Readings from Last 3 Encounters:  10/09/22 229 lb 12.8 oz (104.2 kg)  07/25/22 222 lb 3.2 oz (100.8  kg)  07/07/22 218 lb 3.2 oz (99 kg)    Physical Exam Vitals and nursing note reviewed.  Constitutional:      General: He is not in acute distress.    Appearance: Normal appearance. He is not ill-appearing, toxic-appearing or diaphoretic.  HENT:     Head: Normocephalic and atraumatic.     Right Ear: External ear normal.     Left Ear: External ear normal.     Nose: Nose normal.     Mouth/Throat:     Mouth: Mucous membranes are moist.     Pharynx: Oropharynx is clear.  Eyes:     General: No scleral icterus.       Right eye: No discharge.        Left eye: No discharge.     Extraocular Movements: Extraocular movements intact.     Conjunctiva/sclera: Conjunctivae normal.     Pupils: Pupils are equal, round, and reactive to light.  Cardiovascular:     Rate and Rhythm: Normal rate and regular rhythm.     Pulses: Normal pulses.     Heart sounds: Normal heart sounds. No murmur heard.    No friction rub. No gallop.  Pulmonary:     Effort: Pulmonary effort is normal. No respiratory distress.     Breath sounds: Normal breath sounds. No stridor. No wheezing, rhonchi or rales.  Chest:     Chest wall: No tenderness.  Musculoskeletal:        General: Normal range of motion.     Cervical back: Normal range of  motion and neck supple.  Skin:    General: Skin is warm and dry.     Capillary Refill: Capillary refill takes less than 2 seconds.     Coloration: Skin is not jaundiced or pale.     Findings: No bruising, erythema, lesion or rash.  Neurological:     General: No focal deficit present.     Mental Status: He is alert and oriented to person, place, and time. Mental status is at baseline.  Psychiatric:        Mood and Affect: Mood normal.        Behavior: Behavior normal.        Thought Content: Thought content normal.        Judgment: Judgment normal.     Results for orders placed or performed in visit on 07/07/22  CBC with Differential/Platelet  Result Value Ref Range   WBC 7.7  3.4 - 10.8 x10E3/uL   RBC 4.82 4.14 - 5.80 x10E6/uL   Hemoglobin 13.2 13.0 - 17.7 g/dL   Hematocrit 37.1 06.2 - 51.0 %   MCV 82 79 - 97 fL   MCH 27.4 26.6 - 33.0 pg   MCHC 33.3 31.5 - 35.7 g/dL   RDW 69.4 85.4 - 62.7 %   Platelets 238 150 - 450 x10E3/uL   Neutrophils 65 Not Estab. %   Lymphs 19 Not Estab. %   Monocytes 8 Not Estab. %   Eos 7 Not Estab. %   Basos 1 Not Estab. %   Neutrophils Absolute 5.0 1.4 - 7.0 x10E3/uL   Lymphocytes Absolute 1.5 0.7 - 3.1 x10E3/uL   Monocytes Absolute 0.6 0.1 - 0.9 x10E3/uL   EOS (ABSOLUTE) 0.5 (H) 0.0 - 0.4 x10E3/uL   Basophils Absolute 0.0 0.0 - 0.2 x10E3/uL   Immature Granulocytes 0 Not Estab. %   Immature Grans (Abs) 0.0 0.0 - 0.1 x10E3/uL  Bayer DCA Hb A1c Waived  Result Value Ref Range   HB A1C (BAYER DCA - WAIVED) 6.9 (H) 4.8 - 5.6 %  Comprehensive metabolic panel  Result Value Ref Range   Glucose 102 (H) 70 - 99 mg/dL   BUN 29 (H) 8 - 27 mg/dL   Creatinine, Ser 0.35 (H) 0.76 - 1.27 mg/dL   eGFR 31 (L) >00 XF/GHW/2.99   BUN/Creatinine Ratio 12 10 - 24   Sodium 144 134 - 144 mmol/L   Potassium 4.0 3.5 - 5.2 mmol/L   Chloride 106 96 - 106 mmol/L   CO2 22 20 - 29 mmol/L   Calcium 9.3 8.6 - 10.2 mg/dL   Total Protein 6.8 6.0 - 8.5 g/dL   Albumin 4.4 3.8 - 4.9 g/dL   Globulin, Total 2.4 1.5 - 4.5 g/dL   Bilirubin Total 0.5 0.0 - 1.2 mg/dL   Alkaline Phosphatase 94 44 - 121 IU/L   AST 18 0 - 40 IU/L   ALT 12 0 - 44 IU/L  Lipid Panel w/o Chol/HDL Ratio  Result Value Ref Range   Cholesterol, Total 162 100 - 199 mg/dL   Triglycerides 371 0 - 149 mg/dL   HDL 40 >69 mg/dL   VLDL Cholesterol Cal 22 5 - 40 mg/dL   LDL Chol Calc (NIH) 678 (H) 0 - 99 mg/dL  Uric acid  Result Value Ref Range   Uric Acid 8.2 3.8 - 8.4 mg/dL  VITAMIN D 25 Hydroxy (Vit-D Deficiency, Fractures)  Result Value Ref Range   Vit D, 25-Hydroxy 17.6 (L) 30.0 - 100.0 ng/mL  Assessment & Plan:   Problem List Items Addressed This Visit        Cardiovascular and Mediastinum   Hypertensive heart/kidney disease without HF and with CKD stage III (HCC) - Primary    BP still running high. Will increase his hydralazine to 125mg  TID and recheck in about a month. Call with any concerns.       Relevant Medications   hydrALAZINE (APRESOLINE) 100 MG tablet   hydrALAZINE (APRESOLINE) 25 MG tablet   Other Relevant Orders   Basic metabolic panel     Endocrine   Type 2 diabetes with nephropathy (HCC)    Will recheck levels today and adjust rybelsus dose as needed. Call with any concerns.       Relevant Orders   Hgb A1c w/o eAG     Follow up plan: Return in about 4 weeks (around 11/06/2022).

## 2022-10-09 NOTE — Assessment & Plan Note (Signed)
BP still running high. Will increase his hydralazine to 125mg  TID and recheck in about a month. Call with any concerns.

## 2022-10-09 NOTE — Patient Instructions (Addendum)
Start taking 125mg  (1 of the 100mg  pills he has at home and 1 25mg  pill I've sent into the pharmacy) of hydralazine 3x a day and we'll recheck in about a month

## 2022-10-10 LAB — BASIC METABOLIC PANEL
BUN/Creatinine Ratio: 16 (ref 10–24)
BUN: 35 mg/dL — ABNORMAL HIGH (ref 8–27)
CO2: 20 mmol/L (ref 20–29)
Calcium: 8.9 mg/dL (ref 8.6–10.2)
Chloride: 103 mmol/L (ref 96–106)
Creatinine, Ser: 2.13 mg/dL — ABNORMAL HIGH (ref 0.76–1.27)
Glucose: 140 mg/dL — ABNORMAL HIGH (ref 70–99)
Potassium: 3.5 mmol/L (ref 3.5–5.2)
Sodium: 142 mmol/L (ref 134–144)
eGFR: 35 mL/min/{1.73_m2} — ABNORMAL LOW (ref >59)

## 2022-10-10 LAB — HGB A1C W/O EAG: Hgb A1c MFr Bld: 7.1 % — ABNORMAL HIGH (ref 4.8–5.6)

## 2022-10-11 ENCOUNTER — Ambulatory Visit: Payer: Self-pay | Admitting: *Deleted

## 2022-10-11 NOTE — Telephone Encounter (Signed)
Message from Turkey B sent at 10/11/2022  8:47 AM EDT  Summary: med directions   Pt needs clarification on med, Hydrozaline, she states it says its 125mg  take 3 times a day, but pt had 25mg  taking once a day.          Call History  Contact Date/Time Type Contact Phone/Fax User  10/11/2022 08:45 AM EDT Phone (Incoming) Jennette Dubin (Emergency Contact) [phone] Eliseo Gum, Deedra Ehrich   Reason for Disposition  Caller has medicine question only, adult not sick, AND triager answers question  Answer Assessment - Initial Assessment Questions 1. NAME of MEDICINE: "What medicine(s) are you calling about?"     Hydralazine 125 mg and same medicine at 25 mg 2. QUESTION: "What is your question?" (e.g., double dose of medicine, side effect)     Wife has question.  I put out his pills for him every week.  He went to the dr but I see some changes have been made.  How should he be taking  this?    Referencing the chart I let her know he is to take both the 100 mg tablet and the 25 mg tablet together to equal 125 mg total 3 times a day.  She verbalized understanding.  He is on Rybelsus 7 mg and only has a few pills left.   He was originally given samples and now he is on the higher dose of 7 mg and needs a prescription for it.    I let her know I would let Dr. Laural Benes know this so a refill could be sent in.   3. PRESCRIBER: "Who prescribed the medicine?" Reason: if prescribed by specialist, call should be referred to that group.     Dr. Laural Benes 4. SYMPTOMS: "Do you have any symptoms?" If Yes, ask: "What symptoms are you having?"  "How bad are the symptoms (e.g., mild, moderate, severe)     N/A 5. PREGNANCY:  "Is there any chance that you are pregnant?" "When was your last menstrual period?"     N/A  Protocols used: Medication Question Call-A-AH

## 2022-10-11 NOTE — Telephone Encounter (Signed)
  Chief Complaint: Wife, Harriett Sine needed clarification on how he should be taking the hydralazine 100 mg and 25 mg tablets.   I let her know he needed to take both of them 3 times a day.   She verbalized understanding. Also needs a refill of the Rybelsus 7 mg.    The pharmacy did not have a prescription for it and he only has a few pills left. Symptoms: N/A Frequency: N/A Pertinent Negatives: Patient denies N/A Disposition: [] ED /[] Urgent Care (no appt availability in office) / [] Appointment(In office/virtual)/ []  [facility]/ [] Home Care/ [] Refused Recommended Disposition /[] [facility]/ [x]  Follow-up with PCP Additional Notes: Message sent to Dr. Laural Benes letting her know he needs a refill of the Rybelsus.   Pharmacy did not have an rx for it per wife when they picked up his hydralazine.

## 2022-10-12 NOTE — Telephone Encounter (Signed)
We were waiting on his results to see if he needed to go up on his rybelsus. Should be at his pharmacy now.

## 2022-10-13 NOTE — Telephone Encounter (Signed)
Patient wife nancy notified that script has been sent to the pharmacy. Voiced understanding

## 2022-11-07 ENCOUNTER — Encounter: Payer: Self-pay | Admitting: Family Medicine

## 2022-11-07 ENCOUNTER — Ambulatory Visit (INDEPENDENT_AMBULATORY_CARE_PROVIDER_SITE_OTHER): Payer: PRIVATE HEALTH INSURANCE | Admitting: Family Medicine

## 2022-11-07 VITALS — BP 130/82 | HR 81 | Ht 70.0 in | Wt 228.8 lb

## 2022-11-07 DIAGNOSIS — I1310 Hypertensive heart and chronic kidney disease without heart failure, with stage 1 through stage 4 chronic kidney disease, or unspecified chronic kidney disease: Secondary | ICD-10-CM | POA: Diagnosis not present

## 2022-11-07 DIAGNOSIS — N183 Chronic kidney disease, stage 3 unspecified: Secondary | ICD-10-CM

## 2022-11-07 NOTE — Assessment & Plan Note (Signed)
Under good control. Continue current regimen. Refills up to date. Labs drawn today.

## 2022-11-07 NOTE — Progress Notes (Signed)
BP 130/82   Pulse 81   Ht 5\' 10"  (1.778 m)   Wt 228 lb 12.8 oz (103.8 kg)   SpO2 97%   BMI 32.83 kg/m    Subjective:    Patient ID: Anthony Perches., male    DOB: Dec 11, 1960, 62 y.o.   MRN: [id number]  HPI: Anthony Mcglory. is a 61 y.o. male  Chief Complaint  Patient presents with   Hypertension   HYPERTENSION with Chronic Kidney Disease Hypertension status: controlled  Satisfied with current treatment? yes Duration of hypertension: chronic BP monitoring frequency:  rarely BP medication side effects:  no Medication compliance: excellent compliance Aspirin: yes Recurrent headaches: no Visual changes: no Palpitations: no Dyspnea: no Chest pain: no Lower extremity edema: no Dizzy/lightheaded: no  Relevant past medical, surgical, family and social history reviewed and updated as indicated. Interim medical history since our last visit reviewed. Allergies and medications reviewed and updated.  Review of Systems  Constitutional: Negative.   Respiratory: Negative.    Cardiovascular: Negative.   Gastrointestinal: Negative.   Musculoskeletal: Negative.   Neurological: Negative.   Psychiatric/Behavioral: Negative.      Per HPI unless specifically indicated above     Objective:    BP 130/82   Pulse 81   Ht 5\' 10"  (1.778 m)   Wt 228 lb 12.8 oz (103.8 kg)   SpO2 97%   BMI 32.83 kg/m   Wt Readings from Last 3 Encounters:  11/07/22 228 lb 12.8 oz (103.8 kg)  10/09/22 229 lb 12.8 oz (104.2 kg)  07/25/22 222 lb 3.2 oz (100.8 kg)    Physical Exam Vitals and nursing note reviewed.  Constitutional:      General: He is not in acute distress.    Appearance: Normal appearance. He is not ill-appearing, toxic-appearing or diaphoretic.  HENT:     Head: Normocephalic and atraumatic.     Right Ear: External ear normal.     Left Ear: External ear normal.     Nose: Nose normal.     Mouth/Throat:     Mouth: Mucous membranes are moist.     Pharynx: Oropharynx is  clear.  Eyes:     General: No scleral icterus.       Right eye: No discharge.        Left eye: No discharge.     Extraocular Movements: Extraocular movements intact.     Conjunctiva/sclera: Conjunctivae normal.     Pupils: Pupils are equal, round, and reactive to light.  Cardiovascular:     Rate and Rhythm: Normal rate and regular rhythm.     Pulses: Normal pulses.     Heart sounds: Normal heart sounds. No murmur heard.    No friction rub. No gallop.  Pulmonary:     Effort: Pulmonary effort is normal. No respiratory distress.     Breath sounds: Normal breath sounds. No stridor. No wheezing, rhonchi or rales.  Chest:     Chest wall: No tenderness.  Musculoskeletal:        General: Normal range of motion.     Cervical back: Normal range of motion and neck supple.  Skin:    General: Skin is warm and dry.     Capillary Refill: Capillary refill takes less than 2 seconds.     Coloration: Skin is not jaundiced or pale.     Findings: No bruising, erythema, lesion or rash.  Neurological:     General: No focal deficit present.  Mental Status: He is alert and oriented to person, place, and time. Mental status is at baseline.  Psychiatric:        Mood and Affect: Mood normal.        Behavior: Behavior normal.        Thought Content: Thought content normal.        Judgment: Judgment normal.     Results for orders placed or performed in visit on 10/09/22  Hgb A1c w/o eAG  Result Value Ref Range   Hgb A1c MFr Bld 7.1 (H) 4.8 - 5.6 %  Basic metabolic panel  Result Value Ref Range   Glucose 140 (H) 70 - 99 mg/dL   BUN 35 (H) 8 - 27 mg/dL   Creatinine, Ser 6.04 (H) 0.76 - 1.27 mg/dL   eGFR 35 (L) >54 UJ/WJX/9.14   BUN/Creatinine Ratio 16 10 - 24   Sodium 142 134 - 144 mmol/L   Potassium 3.5 3.5 - 5.2 mmol/L   Chloride 103 96 - 106 mmol/L   CO2 20 20 - 29 mmol/L   Calcium 8.9 8.6 - 10.2 mg/dL      Assessment & Plan:   Problem List Items Addressed This Visit        Cardiovascular and Mediastinum   Hypertensive heart/kidney disease without HF and with CKD stage III (HCC) - Primary    Under good control. Continue current regimen. Refills up to date. Labs drawn today.      Relevant Medications   hydrochlorothiazide (HYDRODIURIL) 25 MG tablet   Other Relevant Orders   Basic metabolic panel     Follow up plan: Return in about 2 months (around 01/07/2023).

## 2022-11-08 LAB — BASIC METABOLIC PANEL
BUN/Creatinine Ratio: 15 (ref 10–24)
BUN: 44 mg/dL — ABNORMAL HIGH (ref 8–27)
CO2: 20 mmol/L (ref 20–29)
Calcium: 9 mg/dL (ref 8.6–10.2)
Chloride: 104 mmol/L (ref 96–106)
Creatinine, Ser: 2.93 mg/dL — ABNORMAL HIGH (ref 0.76–1.27)
Glucose: 135 mg/dL — ABNORMAL HIGH (ref 70–99)
Potassium: 4.1 mmol/L (ref 3.5–5.2)
Sodium: 145 mmol/L — ABNORMAL HIGH (ref 134–144)
eGFR: 24 mL/min/{1.73_m2} — ABNORMAL LOW (ref >59)

## 2022-12-21 ENCOUNTER — Other Ambulatory Visit: Payer: Self-pay | Admitting: Family Medicine

## 2022-12-25 NOTE — Telephone Encounter (Signed)
Requested medication (s) are due for refill today - no  Requested medication (s) are on the active medication list - yes  Future visit scheduled -yes  Last refill: 07/25/22 #90 1RF  Notes to clinic: off protocol- provider review   Requested Prescriptions  Pending Prescriptions Disp Refills   RYBELSUS 7 MG TABS [Pharmacy Med Name: RYBELSUS 7 MG TABLET] 90 tablet 1    Sig: Take 1 tablet (7 mg total) by mouth daily.     Off-Protocol Failed - 12/21/2022  9:54 AM      Failed - Medication not assigned to a protocol, review manually.      Passed - Valid encounter within last 12 months    Recent Outpatient Visits           1 month ago Hypertensive heart and kidney disease without heart failure and with stage 3 chronic kidney disease, unspecified whether stage 3a or 3b CKD (HCC)   [place] [facility] [place], [name], DO   2 months ago Hypertensive heart and kidney disease without heart failure and with stage 3 chronic kidney disease, unspecified whether stage 3a or 3b CKD (HCC)   [place] [facility] [place], [name], DO   5 months ago Type 2 diabetes with nephropathy [facility])   [place] [facility] [place], [name], DO   5 months ago Type 2 diabetes with nephropathy [facility])   [place] [facility] [place], [name], DO   8 months ago Type 2 diabetes with nephropathy [facility])   [place] [facility] [place], Oralia Rud, DO       Future Appointments             In 2 weeks Laural Benes, Oralia Rud, DO [place] [facility]               Requested Prescriptions  Pending Prescriptions Disp Refills   RYBELSUS 7 MG TABS [Pharmacy Med Name: RYBELSUS 7 MG TABLET] 90 tablet 1    Sig: Take 1 tablet (7 mg total) by mouth daily.     Off-Protocol Failed - 12/21/2022  9:54 AM      Failed - Medication not assigned to a protocol, review manually.      Passed - Valid encounter within last 12 months     Recent Outpatient Visits           1 month ago Hypertensive heart and kidney disease without heart failure and with stage 3 chronic kidney disease, unspecified whether stage 3a or 3b CKD (HCC)   [place] [facility] [place], [name], DO   2 months ago Hypertensive heart and kidney disease without heart failure and with stage 3 chronic kidney disease, unspecified whether stage 3a or 3b CKD (HCC)   [place] [facility] [place], [name], DO   5 months ago Type 2 diabetes with nephropathy [facility])   [place] [facility] [place], [name], DO   5 months ago Type 2 diabetes with nephropathy [facility])   [place] [facility] [place], [name], DO   8 months ago Type 2 diabetes with nephropathy [facility])   [place] [facility] [name], DO       Future Appointments             In 2 weeks Laural Benes, Oralia Rud, DO [place] [facility]

## 2023-01-03 ENCOUNTER — Other Ambulatory Visit: Payer: Self-pay | Admitting: Family Medicine

## 2023-01-04 NOTE — Telephone Encounter (Signed)
Requested Prescriptions  Pending Prescriptions Disp Refills   hydrALAZINE (APRESOLINE) 25 MG tablet [Pharmacy Med Name: HYDRALAZINE 25 MG TABLET] 90 tablet 1    Sig: TAKE 1 TABLET BY MOUTH 3 X A DAY WITH 1 100MG  HYDRALAZINE 3X A DAY FOR 125MG  THREE TIMES A DAY     Cardiovascular:  Vasodilators Failed - 01/04/2023  8:50 AM      Failed - ANA Screen, Ifa, Serum in normal range and within 360 days    No results found for: "ANA", "ANATITER", "LABANTI"       Passed - HCT in normal range and within 360 days    Hematocrit  Date Value Ref Range Status  07/07/2022 39.6 37.5 - 51.0 % Final         Passed - HGB in normal range and within 360 days    Hemoglobin  Date Value Ref Range Status  07/07/2022 13.2 13.0 - 17.7 g/dL Final         Passed - RBC in normal range and within 360 days    RBC  Date Value Ref Range Status  07/07/2022 4.82 4.14 - 5.80 x10E6/uL Final  02/15/2018 4.44 4.22 - 5.81 MIL/uL Final         Passed - WBC in normal range and within 360 days    WBC  Date Value Ref Range Status  07/07/2022 7.7 3.4 - 10.8 x10E3/uL Final  02/15/2018 6.0 4.0 - 10.5 K/uL Final         Passed - PLT in normal range and within 360 days    Platelets  Date Value Ref Range Status  07/07/2022 238 150 - 450 x10E3/uL Final         Passed - Last BP in normal range    BP Readings from Last 1 Encounters:  11/07/22 130/82         Passed - Valid encounter within last 12 months    Recent Outpatient Visits           1 month ago Hypertensive heart and kidney disease without heart failure and with stage 3 chronic kidney disease, unspecified whether stage 3a or 3b CKD (HCC)   [place] [facility] [place], [name], DO   2 months ago Hypertensive heart and kidney disease without heart failure and with stage 3 chronic kidney disease, unspecified whether stage 3a or 3b CKD (HCC)   [place] [facility] [place], [name], DO   5 months ago Type 2 diabetes with nephropathy  [facility])   [place] [facility] [place], [name], DO   6 months ago Type 2 diabetes with nephropathy [facility])   [place] [facility] [place], [name], DO   9 months ago Type 2 diabetes with nephropathy [facility])   [place] [facility] [place], Oralia Rud, DO       Future Appointments             In 4 days [name], DO [place] [facility]

## 2023-01-08 ENCOUNTER — Encounter: Payer: Self-pay | Admitting: Family Medicine

## 2023-01-08 ENCOUNTER — Ambulatory Visit: Payer: PRIVATE HEALTH INSURANCE | Admitting: Family Medicine

## 2023-01-08 VITALS — BP 162/90 | HR 83 | Ht 70.0 in | Wt 230.6 lb

## 2023-01-08 DIAGNOSIS — I1310 Hypertensive heart and chronic kidney disease without heart failure, with stage 1 through stage 4 chronic kidney disease, or unspecified chronic kidney disease: Secondary | ICD-10-CM | POA: Diagnosis not present

## 2023-01-08 DIAGNOSIS — M10371 Gout due to renal impairment, right ankle and foot: Secondary | ICD-10-CM

## 2023-01-08 DIAGNOSIS — E559 Vitamin D deficiency, unspecified: Secondary | ICD-10-CM

## 2023-01-08 DIAGNOSIS — Z7984 Long term (current) use of oral hypoglycemic drugs: Secondary | ICD-10-CM

## 2023-01-08 DIAGNOSIS — E782 Mixed hyperlipidemia: Secondary | ICD-10-CM

## 2023-01-08 DIAGNOSIS — Z0000 Encounter for general adult medical examination without abnormal findings: Secondary | ICD-10-CM

## 2023-01-08 DIAGNOSIS — N183 Chronic kidney disease, stage 3 unspecified: Secondary | ICD-10-CM

## 2023-01-08 DIAGNOSIS — E1121 Type 2 diabetes mellitus with diabetic nephropathy: Secondary | ICD-10-CM | POA: Diagnosis not present

## 2023-01-08 MED ORDER — TRIAMCINOLONE ACETONIDE 0.5 % EX OINT: 1.0000 | TOPICAL_OINTMENT | Freq: Two times a day (BID) | CUTANEOUS | 0 refills | Status: DC

## 2023-01-08 MED ORDER — HYDRALAZINE HCL 100 MG PO TABS: 100.0000 mg | ORAL_TABLET | Freq: Three times a day (TID) | ORAL | 1 refills | Status: DC

## 2023-01-08 MED ORDER — HYDRALAZINE HCL 25 MG PO TABS: 25.0000 mg | ORAL_TABLET | Freq: Three times a day (TID) | ORAL | 1 refills | Status: DC

## 2023-01-08 MED ORDER — METOPROLOL TARTRATE 25 MG PO TABS: 25.0000 mg | ORAL_TABLET | Freq: Two times a day (BID) | ORAL | 1 refills | Status: DC

## 2023-01-08 MED ORDER — RYBELSUS 14 MG PO TABS: 14.0000 mg | ORAL_TABLET | Freq: Every day | ORAL | 1 refills | Status: DC

## 2023-01-08 MED ORDER — AMLODIPINE BESYLATE 10 MG PO TABS: 10.0000 mg | ORAL_TABLET | Freq: Every day | ORAL | 1 refills | Status: DC

## 2023-01-08 MED ORDER — IRBESARTAN 300 MG PO TABS: 300.0000 mg | ORAL_TABLET | Freq: Every day | ORAL | 1 refills | Status: DC

## 2023-01-08 MED ORDER — ALLOPURINOL 100 MG PO TABS: 100.0000 mg | ORAL_TABLET | Freq: Every day | ORAL | 1 refills | Status: DC

## 2023-01-08 MED ORDER — ATORVASTATIN CALCIUM 80 MG PO TABS: 80.0000 mg | ORAL_TABLET | Freq: Every day | ORAL | 1 refills | Status: DC

## 2023-01-08 NOTE — Assessment & Plan Note (Signed)
Under good control on current regimen. Continue current regimen. Continue to monitor. Call with any concerns. Refills given. Labs drawn today.   

## 2023-01-08 NOTE — Assessment & Plan Note (Signed)
Rechecking labs today. Await results. Treat as needed.  °

## 2023-01-08 NOTE — Progress Notes (Signed)
BP (!) 162/90   Pulse 83   Ht 5\' 10"  (1.778 m)   Wt 230 lb 9.6 oz (104.6 kg)   SpO2 96%   BMI 33.09 kg/m    Subjective:    Patient ID: Anthony Sellers., male    DOB: [birth year], 61 y.o.   MRN: [id number]  HPI: Anthony Sellers. is a 62 y.o. male presenting on 01/08/2023 for comprehensive medical examination. Current medical complaints include:  DIABETES Hypoglycemic episodes:no Polydipsia/polyuria: no Visual disturbance: no Chest pain: no Paresthesias: no Glucose Monitoring: no  Accucheck frequency: Not Checking Taking Insulin?: no Blood Pressure Monitoring: not checking Retinal Examination: Not up to Date Foot Exam: Up to Date Diabetic Education: Completed Pneumovax: Up to Date Influenza: Not up to Date Aspirin: yes  No gout flares. Tolerating medicine well. No side effects.   HYPERTENSION / HYPERLIPIDEMIA Satisfied with current treatment? yes Duration of hypertension: chronic BP monitoring frequency: not checking BP medication side effects: no Past BP meds: irbesartan, hydralazine, metoprolol, amlodipine Duration of hyperlipidemia: chronic Cholesterol medication side effects: no Cholesterol supplements: none Past cholesterol medications: atorvastatin Medication compliance: excellent compliance Aspirin: yes Recent stressors: no Recurrent headaches: no Visual changes: no Palpitations: no Dyspnea: no Chest pain: no Lower extremity edema: no Dizzy/lightheaded: no   He currently lives with: wife Interim Problems from his last visit: no  Depression Screen done today and results listed below:     04/06/2022    3:40 PM 01/06/2022    8:55 AM 12/28/2020   10:39 AM 08/14/2019    4:41 PM 08/13/2018    9:46 AM  Depression screen PHQ 2/9  Decreased Interest 0 0 0 0 0  Down, Depressed, Hopeless 0 0 0 0 0  PHQ - 2 Score 0 0 0 0 0  Altered sleeping 0 0 0 0 0  Tired, decreased energy 0 0 0 0 0  Change in appetite 0 0 0 0 0  Feeling bad or failure about  yourself  0 0 0 0 0  Trouble concentrating 0 0 0 0 0  Moving slowly or fidgety/restless 0 0 0 0 0  Suicidal thoughts 0 0 0 0 0  PHQ-9 Score 0 0 0 0 0  Difficult doing work/chores Not difficult at all Not difficult at all  Not difficult at all Not difficult at all    Past Medical History:  Past Medical History:  Diagnosis Date   Diabetes mellitus without complication (HCC)    Enlarged prostate    Gout    Hyperlipidemia    Hypertension    IFG (impaired fasting glucose)    Vitamin D deficiency     Surgical History:  Past Surgical History:  Procedure Laterality Date   COLONOSCOPY WITH PROPOFOL N/A 10/18/2018   Procedure: COLONOSCOPY WITH PROPOFOL;  Surgeon: Wyline Mood, MD;  Location: [facility] ENDOSCOPY;  Service: Gastroenterology;  Laterality: N/A;    Medications:  Current Outpatient Medications on File Prior to Visit  Medication Sig   ACCU-CHEK FASTCLIX LANCETS MISC 1 each by Does not apply route QID.   aspirin (ASPIRIN LOW DOSE) 81 MG EC tablet Take 1 tablet (81 mg total) by mouth daily. Swallow whole.   blood glucose meter kit and supplies KIT Dispense based on patient and insurance preference. Use up to four times daily as directed. (FOR ICD-9 250.00, 250.01).   Blood Glucose Monitoring Suppl (ACCU-CHEK GUIDE) w/Device KIT 1 each by Does not apply route QID.   levocetirizine (XYZAL) 5 MG tablet Take  1 tablet (5 mg total) by mouth every evening.   Misc Natural Products (TART CHERRY ADVANCED PO) Take by mouth.   sildenafil (VIAGRA) 100 MG tablet Take 0.5-1 tablets (50-100 mg total) by mouth daily as needed for erectile dysfunction.   No current facility-administered medications on file prior to visit.    Allergies:  No Known Allergies  Social History:  Social History   Socioeconomic History   Marital status: Married    Spouse name: Not on file   Number of children: Not on file   Years of education: Not on file   Highest education level: Not on file  Occupational History    Not on file  Tobacco Use   Smoking status: Former    Current packs/day: 0.00    Average packs/day: 0.3 packs/day for 8.0 years (2.0 ttl pk-yrs)    Types: Cigarettes    Start date: 01/23/2006    Quit date: 01/23/2014    Years since quitting: 8.9   Smokeless tobacco: Never  Vaping Use   Vaping status: Never Used  Substance and Sexual Activity   Alcohol use: Yes    Alcohol/week: 0.0 standard drinks of alcohol    Comment: on occasion   Drug use: No   Sexual activity: Never  Other Topics Concern   Not on file  Social History Narrative   Not on file   Social Drivers of Health   Financial Resource Strain: Not on file  Food Insecurity: Not on file  Transportation Needs: Not on file  Physical Activity: Not on file  Stress: Not on file  Social Connections: Not on file  Intimate Partner Violence: Not on file   Social History   Tobacco Use  Smoking Status Former   Current packs/day: 0.00   Average packs/day: 0.3 packs/day for 8.0 years (2.0 ttl pk-yrs)   Types: Cigarettes   Start date: 01/23/2006   Quit date: 01/23/2014   Years since quitting: 8.9  Smokeless Tobacco Never   Social History   Substance and Sexual Activity  Alcohol Use Yes   Alcohol/week: 0.0 standard drinks of alcohol   Comment: on occasion    Family History:  Family History  Problem Relation Age of Onset   Hypertension Mother    Diabetes Mother    Alzheimer's disease Father    Diabetes Sister    Hypertension Brother    Cancer Maternal Grandfather        Prostate   Cancer Sister        Breast Cancer    Past medical history, surgical history, medications, allergies, family history and social history reviewed with patient today and changes made to appropriate areas of the chart.   Review of Systems  Constitutional: Negative.   HENT: Negative.    Eyes: Negative.   Respiratory: Negative.    Cardiovascular: Negative.   Gastrointestinal: Negative.   Genitourinary: Negative.   Musculoskeletal:  Negative.   Skin: Negative.   Neurological: Negative.   Endo/Heme/Allergies: Negative.   Psychiatric/Behavioral: Negative.     All other ROS negative except what is listed above and in the HPI.      Objective:    BP (!) 162/90   Pulse 83   Ht 5\' 10"  (1.778 m)   Wt 230 lb 9.6 oz (104.6 kg)   SpO2 96%   BMI 33.09 kg/m   Wt Readings from Last 3 Encounters:  01/08/23 230 lb 9.6 oz (104.6 kg)  11/07/22 228 lb 12.8 oz (103.8 kg)  10/09/22 229 lb  12.8 oz (104.2 kg)    Physical Exam Vitals and nursing note reviewed.  Constitutional:      General: He is not in acute distress.    Appearance: Normal appearance. He is obese. He is not ill-appearing, toxic-appearing or diaphoretic.  HENT:     Head: Normocephalic and atraumatic.     Right Ear: Tympanic membrane, ear canal and external ear normal. There is no impacted cerumen.     Left Ear: Tympanic membrane, ear canal and external ear normal. There is no impacted cerumen.     Nose: Nose normal. No congestion or rhinorrhea.     Mouth/Throat:     Mouth: Mucous membranes are moist.     Pharynx: Oropharynx is clear. No oropharyngeal exudate or posterior oropharyngeal erythema.  Eyes:     General: No scleral icterus.       Right eye: No discharge.        Left eye: No discharge.     Extraocular Movements: Extraocular movements intact.     Conjunctiva/sclera: Conjunctivae normal.     Pupils: Pupils are equal, round, and reactive to light.  Neck:     Vascular: No carotid bruit.  Cardiovascular:     Rate and Rhythm: Normal rate and regular rhythm.     Pulses: Normal pulses.     Heart sounds: No murmur heard.    No friction rub. No gallop.  Pulmonary:     Effort: Pulmonary effort is normal. No respiratory distress.     Breath sounds: Normal breath sounds. No stridor. No wheezing, rhonchi or rales.  Chest:     Chest wall: No tenderness.  Abdominal:     General: Abdomen is flat. Bowel sounds are normal. There is no distension.      Palpations: Abdomen is soft. There is no mass.     Tenderness: There is no abdominal tenderness. There is no right CVA tenderness, left CVA tenderness, guarding or rebound.     Hernia: No hernia is present.  Genitourinary:    Comments: Genital exam deferred with shared decision making Musculoskeletal:        General: No swelling, tenderness, deformity or signs of injury.     Cervical back: Normal range of motion and neck supple. No rigidity. No muscular tenderness.     Right lower leg: No edema.     Left lower leg: No edema.  Lymphadenopathy:     Cervical: No cervical adenopathy.  Skin:    General: Skin is warm and dry.     Capillary Refill: Capillary refill takes less than 2 seconds.     Coloration: Skin is not jaundiced or pale.     Findings: No bruising, erythema, lesion or rash.  Neurological:     General: No focal deficit present.     Mental Status: He is alert and oriented to person, place, and time.     Cranial Nerves: No cranial nerve deficit.     Sensory: No sensory deficit.     Motor: No weakness.     Coordination: Coordination normal.     Gait: Gait normal.     Deep Tendon Reflexes: Reflexes normal.  Psychiatric:        Mood and Affect: Mood normal.        Behavior: Behavior normal.        Thought Content: Thought content normal.        Judgment: Judgment normal.     Results for orders placed or performed in visit on 11/07/22  Basic metabolic panel   Collection Time: 11/07/22  4:39 PM  Result Value Ref Range   Glucose 135 (H) 70 - 99 mg/dL   BUN 44 (H) 8 - 27 mg/dL   Creatinine, Ser 4.09 (H) 0.76 - 1.27 mg/dL   eGFR 24 (L) >81 XB/JYN/8.29   BUN/Creatinine Ratio 15 10 - 24   Sodium 145 (H) 134 - 144 mmol/L   Potassium 4.1 3.5 - 5.2 mmol/L   Chloride 104 96 - 106 mmol/L   CO2 20 20 - 29 mmol/L   Calcium 9.0 8.6 - 10.2 mg/dL      Assessment & Plan:   Problem List Items Addressed This Visit       Cardiovascular and Mediastinum   Hypertensive heart/kidney  disease without HF and with CKD stage III (HCC)   BP running high. Will cut back on sodas and watch diet. Recheck 3 months.       Relevant Medications   amLODipine (NORVASC) 10 MG tablet   atorvastatin (LIPITOR) 80 MG tablet   hydrALAZINE (APRESOLINE) 100 MG tablet   hydrALAZINE (APRESOLINE) 25 MG tablet   irbesartan (AVAPRO) 300 MG tablet   metoprolol tartrate (LOPRESSOR) 25 MG tablet   Other Relevant Orders   Microalbumin, Urine Waived     Endocrine   Type 2 diabetes with nephropathy (HCC)   Has been drinking a lot of sodas. A1c up to 9.2 from 7.1. Will increase his rybelsus to 14mg  and recheck 3 months. Call with any concerns.       Relevant Medications   atorvastatin (LIPITOR) 80 MG tablet   irbesartan (AVAPRO) 300 MG tablet   Semaglutide (RYBELSUS) 14 MG TABS   Other Relevant Orders   Bayer DCA Hb A1c Waived     Musculoskeletal and Integument   Acute gout due to renal impairment involving toe of right foot   Under good control on current regimen. Continue current regimen. Continue to monitor. Call with any concerns. Refills given. Labs drawn today.        Relevant Medications   allopurinol (ZYLOPRIM) 100 MG tablet   Other Relevant Orders   Uric acid     Other   Hyperlipidemia   Under good control on current regimen. Continue current regimen. Continue to monitor. Call with any concerns. Refills given. Labs drawn today.       Relevant Medications   amLODipine (NORVASC) 10 MG tablet   atorvastatin (LIPITOR) 80 MG tablet   hydrALAZINE (APRESOLINE) 100 MG tablet   hydrALAZINE (APRESOLINE) 25 MG tablet   irbesartan (AVAPRO) 300 MG tablet   metoprolol tartrate (LOPRESSOR) 25 MG tablet   Vitamin D deficiency   Rechecking labs today. Await results. Treat as needed.       Relevant Orders   VITAMIN D 25 Hydroxy (Vit-D Deficiency, Fractures)   Other Visit Diagnoses       Routine general medical examination at a health care facility    -  Primary   Vaccines up to  date/declined. Screening labs checked today. Colonoscopy up to date. Continue diet and exercise. Call with any concerns.   Relevant Orders   Comprehensive metabolic panel   CBC with Differential/Platelet   Lipid Panel w/o Chol/HDL Ratio   PSA   TSH       LABORATORY TESTING:  Health maintenance labs ordered today as discussed above.   The natural history of prostate cancer and ongoing controversy regarding screening and potential treatment outcomes of prostate cancer has been discussed with the patient.  The meaning of a false positive PSA and a false negative PSA has been discussed. He indicates understanding of the limitations of this screening test and wishes to proceed with screening PSA testing.   IMMUNIZATIONS:   - Tdap: Tetanus vaccination status reviewed: last tetanus booster within 10 years. - Influenza: Refused - Pneumovax: Up to date - Prevnar: Not applicable - COVID: Refused - HPV: Not applicable - Shingrix vaccine: Refused  SCREENING: - Colonoscopy: Up to date  Discussed with patient purpose of the colonoscopy is to detect colon cancer at curable precancerous or early stages   PATIENT COUNSELING:    Sexuality: Discussed sexually transmitted diseases, partner selection, use of condoms, avoidance of unintended pregnancy  and contraceptive alternatives.   Advised to avoid cigarette smoking.  I discussed with the patient that most people either abstain from alcohol or drink within safe limits (<=14/week and <=4 drinks/occasion for males, <=7/weeks and <= 3 drinks/occasion for females) and that the risk for alcohol disorders and other health effects rises proportionally with the number of drinks per week and how often a drinker exceeds daily limits.  Discussed cessation/primary prevention of drug use and availability of treatment for abuse.   Diet: Encouraged to adjust caloric intake to maintain  or achieve ideal body weight, to reduce intake of dietary saturated fat and  total fat, to limit sodium intake by avoiding high sodium foods and not adding table salt, and to maintain adequate dietary potassium and calcium preferably from fresh fruits, vegetables, and low-fat dairy products.    stressed the importance of regular exercise  Injury prevention: Discussed safety belts, safety helmets, smoke detector, smoking near bedding or upholstery.   Dental health: Discussed importance of regular tooth brushing, flossing, and dental visits.   Follow up plan: NEXT PREVENTATIVE PHYSICAL DUE IN 1 YEAR. Return in about 3 months (around 04/08/2023) for please add to eye exam list.

## 2023-01-08 NOTE — Assessment & Plan Note (Addendum)
BP running high. Will cut back on sodas and watch diet. Recheck 3 months.

## 2023-01-08 NOTE — Assessment & Plan Note (Signed)
Has been drinking a lot of sodas. A1c up to 9.2 from 7.1. Will increase his rybelsus to 14mg  and recheck 3 months. Call with any concerns.

## 2023-01-09 ENCOUNTER — Other Ambulatory Visit: Payer: Self-pay | Admitting: Family Medicine

## 2023-01-09 LAB — COMPREHENSIVE METABOLIC PANEL
ALT: 21 [IU]/L (ref 0–44)
AST: 14 [IU]/L (ref 0–40)
Albumin: 3.9 g/dL (ref 3.9–4.9)
Alkaline Phosphatase: 133 [IU]/L — ABNORMAL HIGH (ref 44–121)
BUN/Creatinine Ratio: 14 (ref 10–24)
BUN: 35 mg/dL — ABNORMAL HIGH (ref 8–27)
Bilirubin Total: 0.3 mg/dL (ref 0.0–1.2)
CO2: 18 mmol/L — ABNORMAL LOW (ref 20–29)
Calcium: 8.8 mg/dL (ref 8.6–10.2)
Chloride: 103 mmol/L (ref 96–106)
Creatinine, Ser: 2.59 mg/dL — ABNORMAL HIGH (ref 0.76–1.27)
Globulin, Total: 2.6 g/dL (ref 1.5–4.5)
Glucose: 305 mg/dL — ABNORMAL HIGH (ref 70–99)
Potassium: 4.2 mmol/L (ref 3.5–5.2)
Sodium: 136 mmol/L (ref 134–144)
Total Protein: 6.5 g/dL (ref 6.0–8.5)
eGFR: 27 mL/min/{1.73_m2} — ABNORMAL LOW (ref >59)

## 2023-01-09 LAB — VITAMIN D 25 HYDROXY (VIT D DEFICIENCY, FRACTURES): Vit D, 25-Hydroxy: 9.7 ng/mL — ABNORMAL LOW (ref 30.0–100.0)

## 2023-01-09 LAB — CBC WITH DIFFERENTIAL/PLATELET
Basophils Absolute: 0.1 10*3/uL (ref 0.0–0.2)
Basos: 1 %
EOS (ABSOLUTE): 0.4 10*3/uL (ref 0.0–0.4)
Eos: 5 %
Hematocrit: 41.7 % (ref 37.5–51.0)
Hemoglobin: 13.4 g/dL (ref 13.0–17.7)
Immature Grans (Abs): 0.1 10*3/uL (ref 0.0–0.1)
Immature Granulocytes: 1 %
Lymphocytes Absolute: 1.4 10*3/uL (ref 0.7–3.1)
Lymphs: 17 %
MCH: 27.1 pg (ref 26.6–33.0)
MCHC: 32.1 g/dL (ref 31.5–35.7)
MCV: 84 fL (ref 79–97)
Monocytes Absolute: 0.7 10*3/uL (ref 0.1–0.9)
Monocytes: 8 %
Neutrophils Absolute: 5.6 10*3/uL (ref 1.4–7.0)
Neutrophils: 68 %
Platelets: 259 10*3/uL (ref 150–450)
RBC: 4.95 x10E6/uL (ref 4.14–5.80)
RDW: 13.2 % (ref 11.6–15.4)
WBC: 8.2 10*3/uL (ref 3.4–10.8)

## 2023-01-09 LAB — PSA: Prostate Specific Ag, Serum: 1.8 ng/mL (ref 0.0–4.0)

## 2023-01-09 LAB — LIPID PANEL W/O CHOL/HDL RATIO
Cholesterol, Total: 191 mg/dL (ref 100–199)
HDL: 36 mg/dL — ABNORMAL LOW (ref >39)
LDL Chol Calc (NIH): 112 mg/dL — ABNORMAL HIGH (ref 0–99)
Triglycerides: 248 mg/dL — ABNORMAL HIGH (ref 0–149)
VLDL Cholesterol Cal: 43 mg/dL — ABNORMAL HIGH (ref 5–40)

## 2023-01-09 LAB — MICROALBUMIN, URINE WAIVED
Creatinine, Urine Waived: 200 mg/dL (ref 10–300)
Microalb, Ur Waived: 150 mg/L — ABNORMAL HIGH (ref 0–19)
Microalb/Creat Ratio: >300 mg/g — ABNORMAL HIGH (ref <30)

## 2023-01-09 LAB — BAYER DCA HB A1C WAIVED: HB A1C (BAYER DCA - WAIVED): 9.2 % — ABNORMAL HIGH (ref 4.8–5.6)

## 2023-01-09 LAB — URIC ACID: Uric Acid: 5.9 mg/dL (ref 3.8–8.4)

## 2023-01-09 LAB — TSH: TSH: 2.97 u[IU]/mL (ref 0.450–4.500)

## 2023-01-09 MED ORDER — VITAMIN D (ERGOCALCIFEROL) 1.25 MG (50000 UNIT) PO CAPS: 50000.0000 [IU] | ORAL_CAPSULE | ORAL | 3 refills | Status: AC

## 2023-01-26 ENCOUNTER — Other Ambulatory Visit: Payer: Self-pay | Admitting: Family Medicine

## 2023-01-30 NOTE — Telephone Encounter (Signed)
 Rybelsus  dose was increased to 14 MG on 01/08/23.   Requested Prescriptions  Pending Prescriptions Disp Refills   levocetirizine (XYZAL ) 5 MG tablet [Pharmacy Med Name: LEVOCETIRIZINE 5 MG TABLET] 90 tablet 1    Sig: TAKE 1 TABLET BY MOUTH EVERY DAY IN THE EVENING     Ear, Nose, and Throat:  Antihistamines - levocetirizine dihydrochloride  Failed - 01/30/2023 11:16 AM      Failed - Cr in normal range and within 360 days    Creatinine, Ser  Date Value Ref Range Status  01/08/2023 2.59 (H) 0.76 - 1.27 mg/dL Final         Passed - eGFR is 10 or above and within 360 days    GFR calc Af Amer  Date Value Ref Range Status  02/16/2020 38 (L) >59 mL/min/1.73 Final    Comment:    **In accordance with recommendations from the NKF-ASN Task force,**   [facility] is in the process of updating its eGFR calculation to the   2021 CKD-EPI creatinine equation that estimates kidney function   without a race variable.    GFR calc non Af Amer  Date Value Ref Range Status  02/16/2020 33 (L) >59 mL/min/1.73 Final   eGFR  Date Value Ref Range Status  01/08/2023 27 (L) >59 mL/min/1.73 Final         Passed - Valid encounter within last 12 months    Recent Outpatient Visits           3 weeks ago Routine general medical examination at a health care facility   [place] [facility] [place] P, DO   2 months ago Hypertensive heart and kidney disease without heart failure and with stage 3 chronic kidney disease, unspecified whether stage 3a or 3b CKD (HCC)   [place] [facility] [place], [name], DO   3 months ago Hypertensive heart and kidney disease without heart failure and with stage 3 chronic kidney disease, unspecified whether stage 3a or 3b CKD (HCC)   [place] [facility] [place], [name], DO   6 months ago Type 2 diabetes with nephropathy [facility])   [place] [facility] [place], [name], DO   6 months ago Type 2 diabetes with  nephropathy [facility])   [place] [facility] [place], [place], DO       Future Appointments             In 2 months [place], [name], DO [place] [facility]            Refused Prescriptions Disp Refills   RYBELSUS  7 MG TABS [Pharmacy Med Name: RYBELSUS  7 MG TABLET] 90 tablet 1    Sig: TAKE 1 TABLET (7 MG TOTAL) BY MOUTH DAILY     Off-Protocol Failed - 01/30/2023 11:16 AM      Failed - Medication not assigned to a protocol, review manually.      Passed - Valid encounter within last 12 months    Recent Outpatient Visits           3 weeks ago Routine general medical examination at a health care facility   [facility] [place] P, DO   2 months ago Hypertensive heart and kidney disease without heart failure and with stage 3 chronic kidney disease, unspecified whether stage 3a or 3b CKD (HCC)   [place] [facility] [place], [name], DO   3 months ago  Hypertensive heart and kidney disease without heart failure and with stage 3 chronic kidney disease, unspecified whether stage 3a or 3b CKD (HCC)   [place] [facility] [place], [name], DO   6 months ago Type 2 diabetes with nephropathy [facility])   [place] [facility] [place], [name], DO   6 months ago Type 2 diabetes with nephropathy [facility])   [place] [facility] Vicci Duwaine SQUIBB, DO       Future Appointments             In 2 months Vicci, Duwaine SQUIBB, DO  [facility], [facility]

## 2023-02-08 ENCOUNTER — Ambulatory Visit (INDEPENDENT_AMBULATORY_CARE_PROVIDER_SITE_OTHER): Payer: PRIVATE HEALTH INSURANCE | Admitting: Nurse Practitioner

## 2023-02-08 ENCOUNTER — Telehealth: Payer: Self-pay | Admitting: Family Medicine

## 2023-02-08 ENCOUNTER — Encounter: Payer: Self-pay | Admitting: Nurse Practitioner

## 2023-02-08 VITALS — BP 130/76 | HR 86 | Temp 97.2°F | Ht 70.0 in | Wt 214.2 lb

## 2023-02-08 DIAGNOSIS — E1121 Type 2 diabetes mellitus with diabetic nephropathy: Secondary | ICD-10-CM

## 2023-02-08 DIAGNOSIS — Z7984 Long term (current) use of oral hypoglycemic drugs: Secondary | ICD-10-CM

## 2023-02-08 LAB — BAYER DCA HB A1C WAIVED: HB A1C (BAYER DCA - WAIVED): 13.5 % — ABNORMAL HIGH (ref 4.8–5.6)

## 2023-02-08 MED ORDER — "PEN NEEDLES 3/16"" 31G X 5 MM MISC": 1.0000 [IU] | Freq: Every evening | 1 refills | Status: DC

## 2023-02-08 MED ORDER — LANTUS SOLOSTAR 100 UNIT/ML ~~LOC~~ SOPN: 20.0000 [IU] | PEN_INJECTOR | Freq: Every day | SUBCUTANEOUS | 1 refills | Status: DC

## 2023-02-08 NOTE — Telephone Encounter (Signed)
Pen needles sent

## 2023-02-08 NOTE — Progress Notes (Signed)
BP 130/76 (BP Location: Left Arm, Patient Position: Sitting)   Pulse 86   Temp (!) 97.2 F (36.2 C) (Oral)   Ht 5\' 10"  (1.778 m)   Wt 214 lb 3.2 oz (97.2 kg)   SpO2 95%   BMI 30.73 kg/m    Subjective:    Patient ID: Anthony Perches., male    DOB: Jun 05, 1960, 63 y.o.   MRN: [id number]  HPI: Anthony Rosendale. is a 63 y.o. male  Chief Complaint  Patient presents with   Diabetes    Dry mouth   DIABETES Patient states he has lost 15lbs since his last visit.  He has been drinking only water no sodas.  Has changes his diet and not consuming pastas or breads. Hypoglycemic episodes:no Polydipsia/polyuria: yes Visual disturbance: no Chest pain: no Paresthesias: no Glucose Monitoring: no  Accucheck frequency: Not Checking  Fasting glucose:  Post prandial:  Evening:  Before meals: Taking Insulin?: no  Long acting insulin:  Short acting insulin:   Denies HA, CP, SOB, dizziness, palpitations, visual changes, and lower extremity swelling.   Relevant past medical, surgical, family and social history reviewed and updated as indicated. Interim medical history since our last visit reviewed. Allergies and medications reviewed and updated.  Review of Systems  Constitutional:  Positive for unexpected weight change.  Eyes:  Negative for visual disturbance.  Respiratory:  Negative for chest tightness and shortness of breath.   Cardiovascular:  Negative for chest pain, palpitations and leg swelling.  Endocrine: Positive for polydipsia and polyphagia. Negative for polyuria.  Neurological:  Negative for dizziness, light-headedness, numbness and headaches.    Per HPI unless specifically indicated above     Objective:    BP 130/76 (BP Location: Left Arm, Patient Position: Sitting)   Pulse 86   Temp (!) 97.2 F (36.2 C) (Oral)   Ht 5\' 10"  (1.778 m)   Wt 214 lb 3.2 oz (97.2 kg)   SpO2 95%   BMI 30.73 kg/m   Wt Readings from Last 3 Encounters:  02/08/23 214 lb 3.2 oz (97.2  kg)  01/08/23 230 lb 9.6 oz (104.6 kg)  11/07/22 228 lb 12.8 oz (103.8 kg)    Physical Exam Vitals and nursing note reviewed.  Constitutional:      General: He is not in acute distress.    Appearance: Normal appearance. He is not ill-appearing, toxic-appearing or diaphoretic.  HENT:     Head: Normocephalic.     Right Ear: External ear normal.     Left Ear: External ear normal.     Nose: Nose normal. No congestion or rhinorrhea.     Mouth/Throat:     Mouth: Mucous membranes are moist.  Eyes:     General:        Right eye: No discharge.        Left eye: No discharge.     Extraocular Movements: Extraocular movements intact.     Conjunctiva/sclera: Conjunctivae normal.     Pupils: Pupils are equal, round, and reactive to light.  Cardiovascular:     Rate and Rhythm: Normal rate and regular rhythm.     Heart sounds: No murmur heard. Pulmonary:     Effort: Pulmonary effort is normal. No respiratory distress.     Breath sounds: Normal breath sounds. No wheezing, rhonchi or rales.  Abdominal:     General: Abdomen is flat. Bowel sounds are normal.  Musculoskeletal:     Cervical back: Normal range of motion and neck  supple.  Skin:    General: Skin is warm and dry.     Capillary Refill: Capillary refill takes less than 2 seconds.  Neurological:     General: No focal deficit present.     Mental Status: He is alert and oriented to person, place, and time.  Psychiatric:        Mood and Affect: Mood normal.        Behavior: Behavior normal.        Thought Content: Thought content normal.        Judgment: Judgment normal.     Results for orders placed or performed in visit on 01/08/23  Bayer DCA Hb A1c Waived   Collection Time: 01/08/23  4:33 PM  Result Value Ref Range   HB A1C (BAYER DCA - WAIVED) 9.2 (H) 4.8 - 5.6 %  Microalbumin, Urine Waived   Collection Time: 01/08/23  4:33 PM  Result Value Ref Range   Microalb, Ur Waived 150 (H) 0 - 19 mg/L   Creatinine, Urine Waived 200  10 - 300 mg/dL   Microalb/Creat Ratio >300 (H) <30 mg/g  Comprehensive metabolic panel   Collection Time: 01/08/23  4:35 PM  Result Value Ref Range   Glucose 305 (H) 70 - 99 mg/dL   BUN 35 (H) 8 - 27 mg/dL   Creatinine, Ser 4.69 (H) 0.76 - 1.27 mg/dL   eGFR 27 (L) >62 XB/MWU/1.32   BUN/Creatinine Ratio 14 10 - 24   Sodium 136 134 - 144 mmol/L   Potassium 4.2 3.5 - 5.2 mmol/L   Chloride 103 96 - 106 mmol/L   CO2 18 (L) 20 - 29 mmol/L   Calcium 8.8 8.6 - 10.2 mg/dL   Total Protein 6.5 6.0 - 8.5 g/dL   Albumin 3.9 3.9 - 4.9 g/dL   Globulin, Total 2.6 1.5 - 4.5 g/dL   Bilirubin Total 0.3 0.0 - 1.2 mg/dL   Alkaline Phosphatase 133 (H) 44 - 121 IU/L   AST 14 0 - 40 IU/L   ALT 21 0 - 44 IU/L  CBC with Differential/Platelet   Collection Time: 01/08/23  4:35 PM  Result Value Ref Range   WBC 8.2 3.4 - 10.8 x10E3/uL   RBC 4.95 4.14 - 5.80 x10E6/uL   Hemoglobin 13.4 13.0 - 17.7 g/dL   Hematocrit 44.0 10.2 - 51.0 %   MCV 84 79 - 97 fL   MCH 27.1 26.6 - 33.0 pg   MCHC 32.1 31.5 - 35.7 g/dL   RDW 72.5 36.6 - 44.0 %   Platelets 259 150 - 450 x10E3/uL   Neutrophils 68 Not Estab. %   Lymphs 17 Not Estab. %   Monocytes 8 Not Estab. %   Eos 5 Not Estab. %   Basos 1 Not Estab. %   Neutrophils Absolute 5.6 1.4 - 7.0 x10E3/uL   Lymphocytes Absolute 1.4 0.7 - 3.1 x10E3/uL   Monocytes Absolute 0.7 0.1 - 0.9 x10E3/uL   EOS (ABSOLUTE) 0.4 0.0 - 0.4 x10E3/uL   Basophils Absolute 0.1 0.0 - 0.2 x10E3/uL   Immature Granulocytes 1 Not Estab. %   Immature Grans (Abs) 0.1 0.0 - 0.1 x10E3/uL  Lipid Panel w/o Chol/HDL Ratio   Collection Time: 01/08/23  4:35 PM  Result Value Ref Range   Cholesterol, Total 191 100 - 199 mg/dL   Triglycerides 347 (H) 0 - 149 mg/dL   HDL 36 (L) >42 mg/dL   VLDL Cholesterol Cal 43 (H) 5 - 40 mg/dL   LDL Chol  Calc (NIH) 112 (H) 0 - 99 mg/dL  PSA   Collection Time: 01/08/23  4:35 PM  Result Value Ref Range   Prostate Specific Ag, Serum 1.8 0.0 - 4.0 ng/mL  TSH    Collection Time: 01/08/23  4:35 PM  Result Value Ref Range   TSH 2.970 0.450 - 4.500 uIU/mL  VITAMIN D 25 Hydroxy (Vit-D Deficiency, Fractures)   Collection Time: 01/08/23  4:35 PM  Result Value Ref Range   Vit D, 25-Hydroxy 9.7 (L) 30.0 - 100.0 ng/mL  Uric acid   Collection Time: 01/08/23  4:35 PM  Result Value Ref Range   Uric Acid 5.9 3.8 - 8.4 mg/dL      Assessment & Plan:   Problem List Items Addressed This Visit       Endocrine   Type 2 diabetes with nephropathy (HCC) - Primary   Chronic.  Not well controlled.  A1c increased from 9.2% to 13.5%.  Currently on Rybelsus.  Will start Lantus 20u at bedtime.  Increase by 2u every other night until sugars are less than 150.  Follow up in 2 weeks. Wife stated they just picked up new diabetic testing supplies last night and do not need them.       Relevant Medications   insulin glargine (LANTUS SOLOSTAR) 100 UNIT/ML Solostar Pen   Other Relevant Orders   Bayer DCA Hb A1c Waived     Follow up plan: Return in about 2 weeks (around 02/22/2023) for Diabetes with Dr. Laural Benes.

## 2023-02-08 NOTE — Assessment & Plan Note (Signed)
Chronic.  Not well controlled.  A1c increased from 9.2% to 13.5%.  Currently on Rybelsus.  Will start Lantus 20u at bedtime.  Increase by 2u every other night until sugars are less than 150.  Follow up in 2 weeks. Wife stated they just picked up new diabetic testing supplies last night and do not need them.

## 2023-02-08 NOTE — Telephone Encounter (Signed)
Pt called reporting that he is missing needles for his Lantus Solostar, pharmacist says he is missing a prescription.   [facility] [address] [place] Humph[place]ys, [place] - [address]  [address] [place] [address]  Phone: [phone] Fax: [phone]

## 2023-02-22 ENCOUNTER — Other Ambulatory Visit: Payer: Self-pay | Admitting: Family Medicine

## 2023-02-22 ENCOUNTER — Ambulatory Visit: Payer: PRIVATE HEALTH INSURANCE | Admitting: Family Medicine

## 2023-02-23 ENCOUNTER — Telehealth: Payer: Self-pay | Admitting: Family Medicine

## 2023-02-23 NOTE — Telephone Encounter (Signed)
Requested Prescriptions  Pending Prescriptions Disp Refills   triamcinolone ointment (KENALOG) 0.5 % [Pharmacy Med Name: TRIAMCINOLONE 0.5% OINTMENT] 30 g 0    Sig: APPLY TO AFFECTED AREA TWICE A DAY     Not Delegated - Dermatology:  Corticosteroids Failed - 02/23/2023  1:26 PM      Failed - This refill cannot be delegated      Passed - Valid encounter within last 12 months    Recent Outpatient Visits           2 weeks ago Type 2 diabetes with nephropathy (HCC)   [place] [facility] [name], NP   1 month ago Routine general medical examination at a health care facility   [facility], [name], DO   3 months ago Hypertensive heart and kidney disease without heart failure and with stage 3 chronic kidney disease, unspecified whether stage 3a or 3b CKD (HCC)   [place] [facility] [place], [name], DO   4 months ago Hypertensive heart and kidney disease without heart failure and with stage 3 chronic kidney disease, unspecified whether stage 3a or 3b CKD (HCC)   [place] [facility] [place], [name], DO   7 months ago Type 2 diabetes with nephropathy (HCC)   [place] [facility] [place], Oralia Rud, DO       Future Appointments             In 1 week Laural Benes, Oralia Rud, DO [facility], [facility]   In 1 month Johnson, Coyle, DO [place] [facility], [facility]            Refused Prescriptions Disp Refills   RYBELSUS 7 MG TABS [Pharmacy Med Name: RYBELSUS 7 MG TABLET] 90 tablet 1    Sig: TAKE 1 TABLET (7 MG TOTAL) BY MOUTH DAILY     Off-Protocol Failed - 02/23/2023  1:26 PM      Failed - Medication not assigned to a protocol, review manually.      Passed - Valid encounter within last 12 months    Recent Outpatient Visits           2 weeks ago Type 2 diabetes with nephropathy [facility])   [place] [facility] [name], NP   1  month ago Routine general medical examination at a health care facility   [facility], [name], DO   3 months ago Hypertensive heart and kidney disease without heart failure and with stage 3 chronic kidney disease, unspecified whether stage 3a or 3b CKD (HCC)   [place] [facility], [name], DO   4 months ago Hypertensive heart and kidney disease without heart failure and with stage 3 chronic kidney disease, unspecified whether stage 3a or 3b CKD (HCC)   [place] [facility] [place], [name], DO   7 months ago Type 2 diabetes with nephropathy [facility])   [facility] [name], DO       Future Appointments             In 1 week Laural Benes, Oralia Rud, DO [place] [facility], [facility]   In 1 month [place], Oralia Rud, DO  [facility], [facility]

## 2023-02-23 NOTE — Telephone Encounter (Signed)
Copied from CRM [id number]. Topic: General - Inquiry >> Feb 23, 2023  8:42 AM Haroldine Laws wrote: Reason for CRM: pt went to get the Rybelsis at the pharmacy and it was too expensive.  He wants to know if there is something else to take.  CB#  [phone]

## 2023-02-23 NOTE — Telephone Encounter (Signed)
Reached out to patient via MyChart to make him aware of Dr Henriette Combs recommendations. Attempted to reach patient and his wife via telephone but no response. Advised to reach out to office if he has any question regarding message sent.

## 2023-02-23 NOTE — Telephone Encounter (Signed)
Requested medications are due for refill today.  yes  Requested medications are on the active medications list.  yes  Last refill. 01/08/2023 30g 0 rf  Future visit scheduled.   yes  Notes to clinic.  Refill not delegated.    Requested Prescriptions  Pending Prescriptions Disp Refills   triamcinolone ointment (KENALOG) 0.5 % [Pharmacy Med Name: TRIAMCINOLONE 0.5% OINTMENT] 30 g 0    Sig: APPLY TO AFFECTED AREA TWICE A DAY     Not Delegated - Dermatology:  Corticosteroids Failed - 02/23/2023  1:27 PM      Failed - This refill cannot be delegated      Passed - Valid encounter within last 12 months    Recent Outpatient Visits           2 weeks ago Type 2 diabetes with nephropathy (HCC)   [place] [facility] [name], NP   1 month ago Routine general medical examination at a health care facility   [facility], [name], DO   3 months ago Hypertensive heart and kidney disease without heart failure and with stage 3 chronic kidney disease, unspecified whether stage 3a or 3b CKD (HCC)   [place] [facility] [place], [name], DO   4 months ago Hypertensive heart and kidney disease without heart failure and with stage 3 chronic kidney disease, unspecified whether stage 3a or 3b CKD (HCC)   [place] [facility] [place], [name], DO   7 months ago Type 2 diabetes with nephropathy (HCC)   [place] [facility] [place], Oralia Rud, DO       Future Appointments             In 1 week Laural Benes, Oralia Rud, DO [facility], [facility]   In 1 month Johnson, [place], DO [place] [facility], [facility]            Refused Prescriptions Disp Refills   RYBELSUS 7 MG TABS [Pharmacy Med Name: RYBELSUS 7 MG TABLET] 90 tablet 1    Sig: TAKE 1 TABLET (7 MG TOTAL) BY MOUTH DAILY     Off-Protocol Failed - 02/23/2023  1:27 PM      Failed - Medication not assigned to a  protocol, review manually.      Passed - Valid encounter within last 12 months    Recent Outpatient Visits           2 weeks ago Type 2 diabetes with nephropathy [facility])   [facility] [name], NP   1 month ago Routine general medical examination at a health care facility   [place] [facility], [name], DO   3 months ago Hypertensive heart and kidney disease without heart failure and with stage 3 chronic kidney disease, unspecified whether stage 3a or 3b CKD (HCC)   [place] [facility], [name], DO   4 months ago Hypertensive heart and kidney disease without heart failure and with stage 3 chronic kidney disease, unspecified whether stage 3a or 3b CKD (HCC)   [place] [facility] [place], [name], DO   7 months ago Type 2 diabetes with nephropathy [facility])    [facility] [place], [place], DO       Future Appointments             In 1 week Olevia Perches P, DO [facility], [facility]   In 1 month [place], Oralia Rud, DO [place] [facility], [facility]

## 2023-02-23 NOTE — Telephone Encounter (Signed)
Please have him use the rybelsus coupon card from [email or web address]

## 2023-02-28 ENCOUNTER — Other Ambulatory Visit: Payer: Self-pay | Admitting: Family Medicine

## 2023-03-01 ENCOUNTER — Ambulatory Visit: Payer: Self-pay | Admitting: *Deleted

## 2023-03-01 NOTE — Telephone Encounter (Signed)
 Dosage changed by provider to 14 mg, will refuse this request.  Requested Prescriptions  Pending Prescriptions Disp Refills   RYBELSUS  7 MG TABS [Pharmacy Med Name: RYBELSUS  7 MG TABLET] 90 tablet 1    Sig: TAKE 1 TABLET (7 MG TOTAL) BY MOUTH DAILY     Off-Protocol Failed - 03/01/2023  1:02 PM      Failed - Medication not assigned to a protocol, review manually.      Passed - Valid encounter within last 12 months    Recent Outpatient Visits           3 weeks ago Type 2 diabetes with nephropathy [facility])   [place] [facility] Melvin Pao, NP   1 month ago Routine general medical examination at a health care facility   [facility], [name], DO   3 months ago Hypertensive heart and kidney disease without heart failure and with stage 3 chronic kidney disease, unspecified whether stage 3a or 3b CKD (HCC)   [place] [facility], [name], DO   4 months ago Hypertensive heart and kidney disease without heart failure and with stage 3 chronic kidney disease, unspecified whether stage 3a or 3b CKD (HCC)   [place] [facility] [place], [name], DO   7 months ago Type 2 diabetes with nephropathy [facility])   [place] Vicci Duwaine SQUIBB, DO       Future Appointments             In 5 days Vicci Duwaine SQUIBB, DO [place] [facility], [facility]   In 1 month [place], Duwaine SQUIBB, DO [place] [facility], [facility]

## 2023-03-01 NOTE — Telephone Encounter (Signed)
 Summary: Medication Question   [name] is calling in because she would like to discuss pt's medications. Anthony Sellers says the doctor increased his Rybelsus  from 7 MG to 14 MG and he is also taking insulin  at night. When pt got home from work yesterday and it was 105 and she wants to know if that a safe number. She is concerned with him taking both medications.         Reason for Disposition  [1] Caller has NON-URGENT medication or insulin  pump question AND [2] triager unable to answer question  Answer Assessment - Initial Assessment Questions 1. SYMPTOMS: What symptoms are you concerned about?     Shaky yesterday, eyes blurry- has to wear glasses 2. ONSET:  When did the symptoms start?     Started yesterday 3. BLOOD GLUCOSE: What is your blood glucose level?      105 when checked fasting 4. USUAL RANGE: What is your blood glucose level usually? (e.g., usual fasting morning value, usual evening value)     Prior to new medication- 230-250 fasting 5. TYPE 1 or 2:  Do you know what type of diabetes you have?  (e.g., Type 1, Type 2, Gestational; doesn't know)      Type 2 6. INSULIN : Do you take insulin ? What type of insulin (s) do you use? What is the mode of delivery? (syringe, pen; injection or pump) When did you last give yourself an insulin  dose? (i.e., time or hours/minutes ago) How much did you give? (i.e., how many units)     Insulin  22 units 7. DIABETES PILLS: Do you take any pills for your diabetes? If Yes, ask: What is the name of the medicine(s) that you take for high blood sugar?     Rybelsus  8. OTHER SYMPTOMS: Do you have any symptoms? (e.g., fever, frequent urination, difficulty breathing, vomiting)     no  Protocols used: Diabetes - Low Blood Sugar-A-AH

## 2023-03-01 NOTE — Telephone Encounter (Signed)
  Chief Complaint: symptoms with lowering glucose Symptoms: recent change in diabetic medications- patient reports he felt shaky yesterday -glucose was 105 when checked after work Frequency: patient states he does feel like his eye are more blurry and he has to wear his glasses more Pertinent Negatives: Patient denies fever, frequent urination, difficulty breathing, vomiting  Disposition: [] ED /[] Urgent Care (no appt availability in office) / [] Appointment(In office/virtual)/ []  [place] [facility]/ [] Home Care/ [] Refused Recommended Disposition /[] [place] [facility]/ [x]  Follow-up with PCP Additional Notes: Patient's wife is calling to discuss recent increase in dosing of Rybelsus  and addition of insulin . Patient felt shaky yesterday and states it is messing with eyes. Patient does check glucose levels- but not recording. Advised start recording levels- fasting. Normal glucose ranges reviewed- with change in glucose from over 200 fasting-to lower number- patient may be feeling different- needs to record. Will send message to provider for perimeters and recommendation for when to check levels.

## 2023-03-06 ENCOUNTER — Ambulatory Visit: Payer: Self-pay | Admitting: Family Medicine

## 2023-03-15 ENCOUNTER — Ambulatory Visit: Payer: PRIVATE HEALTH INSURANCE | Admitting: Family Medicine

## 2023-03-19 ENCOUNTER — Other Ambulatory Visit: Payer: Self-pay | Admitting: Family Medicine

## 2023-03-20 ENCOUNTER — Ambulatory Visit (INDEPENDENT_AMBULATORY_CARE_PROVIDER_SITE_OTHER): Payer: PRIVATE HEALTH INSURANCE | Admitting: Family Medicine

## 2023-03-20 ENCOUNTER — Encounter: Payer: Self-pay | Admitting: Family Medicine

## 2023-03-20 VITALS — BP 166/90 | HR 84 | Temp 98.9°F | Resp 15 | Ht 70.0 in | Wt 212.2 lb

## 2023-03-20 DIAGNOSIS — E1121 Type 2 diabetes mellitus with diabetic nephropathy: Secondary | ICD-10-CM

## 2023-03-20 MED ORDER — LANTUS SOLOSTAR 100 UNIT/ML ~~LOC~~ SOPN: 25.0000 [IU] | PEN_INJECTOR | Freq: Every day | SUBCUTANEOUS | 1 refills | Status: DC

## 2023-03-20 NOTE — Assessment & Plan Note (Signed)
 Sugars improving. Will increase his tresiba to 25units and recheck in march as scheduled. Having issues with affording medication. Referral to pharmacy placed today urgently. Continue to monitor. Call with any concerns.

## 2023-03-20 NOTE — Patient Instructions (Signed)
[email or web address]

## 2023-03-20 NOTE — Telephone Encounter (Signed)
 Unable to refill per protocol, Rx expired. Discontinued 01/08/23.  Requested Prescriptions  Pending Prescriptions Disp Refills   RYBELSUS 7 MG TABS [Pharmacy Med Name: RYBELSUS 7 MG TABLET] 90 tablet 1    Sig: TAKE 1 TABLET (7 MG TOTAL) BY MOUTH DAILY     Off-Protocol Failed - 03/20/2023 11:05 AM      Failed - Medication not assigned to a protocol, review manually.      Passed - Valid encounter within last 12 months    Recent Outpatient Visits           1 month ago Type 2 diabetes with nephropathy [facility])   [facility] [name], NP   2 months ago Routine general medical examination at a health care facility   [facility], [name], DO   4 months ago Hypertensive heart and kidney disease without heart failure and with stage 3 chronic kidney disease, unspecified whether stage 3a or 3b CKD (HCC)   [place] [facility], [name], DO   5 months ago Hypertensive heart and kidney disease without heart failure and with stage 3 chronic kidney disease, unspecified whether stage 3a or 3b CKD (HCC)   [place] [facility] [place], [name], DO   7 months ago Type 2 diabetes with nephropathy [facility])   [place] [facility] [place], Oralia Rud, DO       Future Appointments             Today [name], DO [place] [facility], [facility]   In 2 weeks Laural Benes, Oralia Rud, DO [place] [facility]

## 2023-03-20 NOTE — Progress Notes (Signed)
 BP (!) 166/90   Pulse 84   Temp 98.9 F (37.2 C) (Oral)   Resp 15   Ht 5\' 10"  (1.778 m)   Wt 212 lb 3.2 oz (96.3 kg)   SpO2 94%   BMI 30.45 kg/m    Subjective:    Patient ID: Anthony Perches., male    DOB: August 14, 1960, 63 y.o.   MRN: [id number]  HPI: Anthony Wrightsman. is a 62 y.o. male  Chief Complaint  Patient presents with   Diabetes   DIABETES Hypoglycemic episodes:no Polydipsia/polyuria: no Visual disturbance: no Chest pain: no Paresthesias: no Glucose Monitoring: yes  Accucheck frequency: TID  Fasting glucose: 143, 185, 293, 211, 141, 211, 211, 207, 114, 123  Post prandial: 168, 187, 155, 122, 146, 211, 114  Evening: 297, 202, 225, 265, 211, 262, 245, 205, 82 Taking Insulin?: yes  Long acting insulin: 22 units Blood Pressure Monitoring: rarely Retinal Examination: Not up to Date Foot Exam: Up to Date Diabetic Education: Completed Pneumovax: Up to Date Influenza: Up to Date Aspirin: no  Relevant past medical, surgical, family and social history reviewed and updated as indicated. Interim medical history since our last visit reviewed. Allergies and medications reviewed and updated.  Review of Systems  Constitutional: Negative.   Respiratory: Negative.    Cardiovascular: Negative.   Musculoskeletal: Negative.   Psychiatric/Behavioral: Negative.      Per HPI unless specifically indicated above     Objective:    BP (!) 166/90   Pulse 84   Temp 98.9 F (37.2 C) (Oral)   Resp 15   Ht 5\' 10"  (1.778 m)   Wt 212 lb 3.2 oz (96.3 kg)   SpO2 94%   BMI 30.45 kg/m   Wt Readings from Last 3 Encounters:  03/20/23 212 lb 3.2 oz (96.3 kg)  02/08/23 214 lb 3.2 oz (97.2 kg)  01/08/23 230 lb 9.6 oz (104.6 kg)    Physical Exam Vitals and nursing note reviewed.  Constitutional:      General: He is not in acute distress.    Appearance: Normal appearance. He is not ill-appearing, toxic-appearing or diaphoretic.  HENT:     Head: Normocephalic and  atraumatic.     Right Ear: External ear normal.     Left Ear: External ear normal.     Nose: Nose normal.     Mouth/Throat:     Mouth: Mucous membranes are moist.     Pharynx: Oropharynx is clear.  Eyes:     General: No scleral icterus.       Right eye: No discharge.        Left eye: No discharge.     Extraocular Movements: Extraocular movements intact.     Conjunctiva/sclera: Conjunctivae normal.     Pupils: Pupils are equal, round, and reactive to light.  Cardiovascular:     Rate and Rhythm: Normal rate and regular rhythm.     Pulses: Normal pulses.     Heart sounds: Normal heart sounds. No murmur heard.    No friction rub. No gallop.  Pulmonary:     Effort: Pulmonary effort is normal. No respiratory distress.     Breath sounds: Normal breath sounds. No stridor. No wheezing, rhonchi or rales.  Chest:     Chest wall: No tenderness.  Musculoskeletal:        General: Normal range of motion.     Cervical back: Normal range of motion and neck supple.  Skin:    General:  Skin is warm and dry.     Capillary Refill: Capillary refill takes less than 2 seconds.     Coloration: Skin is not jaundiced or pale.     Findings: No bruising, erythema, lesion or rash.  Neurological:     General: No focal deficit present.     Mental Status: He is alert and oriented to person, place, and time. Mental status is at baseline.  Psychiatric:        Mood and Affect: Mood normal.        Behavior: Behavior normal.        Thought Content: Thought content normal.        Judgment: Judgment normal.     Results for orders placed or performed in visit on 02/08/23  Bayer DCA Hb A1c Waived   Collection Time: 02/08/23 10:39 AM  Result Value Ref Range   HB A1C (BAYER DCA - WAIVED) 13.5 (H) 4.8 - 5.6 %      Assessment & Plan:   Problem List Items Addressed This Visit       Endocrine   Type 2 diabetes with nephropathy (HCC) - Primary   Sugars improving. Will increase his tresiba to 25units and  recheck in march as scheduled. Having issues with affording medication. Referral to pharmacy placed today urgently. Continue to monitor. Call with any concerns.       Relevant Medications   insulin glargine (LANTUS SOLOSTAR) 100 UNIT/ML Solostar Pen   Other Relevant Orders   AMB Referral VBCI Care Management     Follow up plan: Return for As scheduled.

## 2023-03-21 ENCOUNTER — Other Ambulatory Visit: Payer: Self-pay | Admitting: Family Medicine

## 2023-03-21 ENCOUNTER — Telehealth: Payer: Self-pay

## 2023-03-21 NOTE — Progress Notes (Signed)
 Care Guide Pharmacy Note  03/21/2023 Name: Anthony STCLAIR Sr. MRN: [id number] DOB: 05-Jul-1960  Referred By: [name], DO Reason for referral: Care Coordination (Outreach to schedule with Pharm d )   Anthony Siad Sr. is a 63 y.o. year old male who is a primary care patient of [name], DO.  Anthony Siad Sr. was referred to the pharmacist for assistance related to: DMII  Successful contact was made with the patient to discuss pharmacy services including being ready for the pharmacist to call at least 5 minutes before the scheduled appointment time and to have medication bottles and any blood pressure readings ready for review. The patient agreed to meet with the pharmacist via telephone visit on (date/time).03/27/2023  Penne Lash , RMA     [place]  [facility], [facility] Guide  Direct Dial: [phone]  Website: [email or web address]

## 2023-03-21 NOTE — Progress Notes (Signed)
 Care Guide Pharmacy Note  03/21/2023 Name: Anthony PICKERAL Sr. MRN: [id number] DOB: Nov 06, 1960  Referred By: [name], DO Reason for referral: Care Coordination (Outreach to schedule with Pharm d )   Anthony Siad Sr. is a 63 y.o. year old male who is a primary care patient of [name], DO.  Anthony Siad Sr. was referred to the pharmacist for assistance related to: DMII  An unsuccessful telephone outreach was attempted today to contact the patient who was referred to the pharmacy team for assistance with medication assistance. Additional attempts will be made to contact the patient.  Penne Lash , RMA     [facility]  [facility], [facility] Guide  Direct Dial: [phone]  Website: [email or web address]

## 2023-03-21 NOTE — Telephone Encounter (Signed)
 Dosage changed to 14 mg on 01/08/23 by provider, will refuse this request.  Requested Prescriptions  Refused Prescriptions Disp Refills   RYBELSUS 7 MG TABS [Pharmacy Med Name: RYBELSUS 7 MG TABLET] 90 tablet 1    Sig: TAKE 1 TABLET (7 MG TOTAL) BY MOUTH DAILY     Off-Protocol Failed - 03/21/2023 11:58 AM      Failed - Medication not assigned to a protocol, review manually.      Passed - Valid encounter within last 12 months    Recent Outpatient Visits           1 month ago Type 2 diabetes with nephropathy [facility])   [facility] [name], NP   2 months ago Routine general medical examination at a health care facility   [facility], [name], DO   4 months ago Hypertensive heart and kidney disease without heart failure and with stage 3 chronic kidney disease, unspecified whether stage 3a or 3b CKD (HCC)   [place] [facility], [name], DO   5 months ago Hypertensive heart and kidney disease without heart failure and with stage 3 chronic kidney disease, unspecified whether stage 3a or 3b CKD (HCC)   [place] [facility] [place], [name], DO   7 months ago Type 2 diabetes with nephropathy [facility])   [facility] [name], DO       Future Appointments             In 2 weeks Laural Benes, Oralia Rud, DO [place] [facility], [facility]

## 2023-03-27 ENCOUNTER — Other Ambulatory Visit: Payer: Self-pay

## 2023-03-27 NOTE — Progress Notes (Addendum)
 03/27/2023 Name: Anthony GERVACIO Sr. MRN: [id number] DOB: 1960-05-08  Chief Complaint  Patient presents with   Diabetes Management Plan   Anthony Ambs. is a 63 y.o. year old male who was referred for medication management by their primary care provider, Laural Benes, [name], DO. They presented for a  telephone visit today.   They were referred to the pharmacist by their PCP for assistance in managing diabetes   Subjective:  Care Team: Primary Care Provider: [name], DO ; Next Scheduled Visit: 04/09/2023  Medication Access/Adherence  Current Pharmacy:  [facility] [address] [place] - [address] [place] [address] Phone: [phone] Fax: [phone]  [id number] [place], [place] - [address] [place] [address] Phone: [phone] Fax: [phone]  -Patient reports affordability concerns with their medications: Yes  -Patient reports access/transportation concerns to their pharmacy: No  -Patient reports adherence concerns with their medications:  Yes    Diabetes: Current medications: Lantus 25 units daily, Rybelsus 14mg  daily -Medications tried in the past: Jardiance caused reaction leading to syncope, metformin stopped due to renal function -Patient does monitor FBG, afternoon, and bedtime BG.  Endorses FBG 105-120 and afternoon bedtime readings 90-160 -A1c 1/16 elevated at 13.5% but patient/wife endorse being out of some medications prior due to cost -Endorses Rybelsus is still expensive even with insurance covering; Lantus is affordable with $20 copay per wife -Patient reports hypoglycemic s/sx including dizziness, shakiness, sweating. This will occur if BG gets in the 80's and is relieved with snack.  Hypertension: Current medications: amlodipine 10mg  daily, hydralazine 125mg  TID, irbesartan 300mg  daily, metoprolol tart 25mg  BID -Patient has a validated, automated, upper arm home BP cuff but does not monitor BP  regularly -Last OV reading 2/25 elevated at 166/90  Hyperlipidemia/ASCVD Risk Reduction Current lipid lowering medications: atorvastatin 80mg  daily  Antiplatelet regimen: ASA 81mg  daily   ASCVD History: N/A Family History: N/A Risk Factors: male sex, metabolic syndrome  PREVENT Risk Score: 10 year risk of CVD: 63.1 - 10 year risk of ASCVD: 39.1 - 10 year risk of HF: 57.8  Objective: Lab Results  Component Value Date   HGBA1C 13.5 (H) 02/08/2023   Lab Results  Component Value Date   CREATININE 2.59 (H) 01/08/2023   BUN 35 (H) 01/08/2023   NA 136 01/08/2023   K 4.2 01/08/2023   CL 103 01/08/2023   CO2 18 (L) 01/08/2023   Lab Results  Component Value Date   CHOL 191 01/08/2023   HDL 36 (L) 01/08/2023   LDLCALC 112 (H) 01/08/2023   TRIG 248 (H) 01/08/2023   CHOLHDL 6.3 02/13/2018   Medications Reviewed Today     Reviewed by Lenna Gilford, RPH (Pharmacist) on 03/27/23 at 0913  Med List Status: <None>   Medication Order Taking? Sig Documenting Provider Last Dose Status Informant  ACCU-CHEK FASTCLIX LANCETS MISC [id number] Yes 1 each by Does not apply route QID. Reubin Milan, MD Taking Active   allopurinol (ZYLOPRIM) 100 MG tablet [id number] Yes Take 1 tablet (100 mg total) by mouth daily. Johnson, [name], DO Taking Active   amLODipine (NORVASC) 10 MG tablet [id number] Yes Take 1 tablet (10 mg total) by mouth daily. Johnson, [name], DO Taking Active   aspirin (ASPIRIN LOW DOSE) 81 MG EC tablet [id number] Yes Take 1 tablet (81 mg total) by mouth daily. Swallow whole. Johnson, [name], DO Taking Active   atorvastatin (LIPITOR)  80 MG tablet [id number] Yes Take 1 tablet (80 mg total) by mouth daily. Olevia Perches P, DO Taking Active   blood glucose meter kit and supplies KIT [id number] Yes Dispense based on patient and insurance preference. Use up to four times daily as directed. (FOR ICD-9 250.00, 250.01). Enid Baas Jude, MD Taking Active   Blood Glucose Monitoring Suppl  (ACCU-CHEK GUIDE) w/Device KIT [id number] Yes 1 each by Does not apply route QID. Reubin Milan, MD Taking Active   hydrALAZINE (APRESOLINE) 100 MG tablet [id number] Yes Take 1 tablet (100 mg total) by mouth 3 (three) times daily. With 1 25mg  hydralazine 3x a day for 125mg  TID total Johnson, [name], DO Taking Active   hydrALAZINE (APRESOLINE) 25 MG tablet [id number] Yes Take 1 tablet (25 mg total) by mouth 3 (three) times daily. With 1 100mg  hydralazine 3x a day for 125mg  TID total Johnson, [name], DO Taking Active   insulin glargine (LANTUS SOLOSTAR) 100 UNIT/ML Solostar Pen [id number] Yes Inject 25 Units into the skin daily. Olevia Perches P, DO Taking Active   Insulin Pen Needle (PEN NEEDLES 3/16") 31G X 5 MM MISC [id number] Yes 1 Units by Does not apply route at bedtime. [name], NP Taking Active   irbesartan (AVAPRO) 300 MG tablet [id number] Yes Take 1 tablet (300 mg total) by mouth daily. Johnson, [name], DO Taking Active   levocetirizine (XYZAL) 5 MG tablet [id number] Yes TAKE 1 TABLET BY MOUTH EVERY DAY IN THE EVENING Johnson, [name], DO Taking Active   metoprolol tartrate (LOPRESSOR) 25 MG tablet [id number] Yes Take 1 tablet (25 mg total) by mouth 2 (two) times daily. [name], DO Taking Active   Misc Natural Products (TART CHERRY ADVANCED PO) [id number] No Take by mouth. [provider] Unknown Active   Semaglutide (RYBELSUS) 14 MG TABS [id number] Yes Take 1 tablet (14 mg total) by mouth daily. Olevia Perches P, DO Taking Active   sildenafil (VIAGRA) 100 MG tablet [id number] Yes Take 0.5-1 tablets (50-100 mg total) by mouth daily as needed for erectile dysfunction. Olevia Perches P, DO Taking Active   triamcinolone ointment (KENALOG) 0.5 % [id number] Yes APPLY TO AFFECTED AREA TWICE A DAY Johnson, [name], DO Taking Active   Vitamin D, Ergocalciferol, (DRISDOL) 1.25 MG (50000 UNIT) CAPS capsule [id number] Yes Take 1 capsule (50,000 Units total) by mouth every 7 (seven)  days. [name], DO Taking Active            Assessment/Plan:   Diabetes: -Currently uncontrolled based on most recent A1c, but home BG readings are reflecting improved control -Reviewed goal A1c, goal fasting, and goal 2 hour post prandial glucose -Obtained Rybelsus manufacturer copay card that should take patient copay down to $10- providing information to give pharmacy via MyChart message -Continue current regimen at this time and regular monitoring/recording of home BG -I recommend follow-up A1c mid-April; if >7%, I recommend increasing basal insulin and/or addition of sulfonylurea or bolus insulin   Hypertension: -Currently uncontrolled -Recommend regular monitoring/recording of home BP (at least 3x/wk) -If BP consistently >130/80, I recommend increasing metoprolol if HR can tolerate  Hyperlipidemia/ASCVD Risk Reduction: -Currently uncontrolled.  -Recommend follow-up lipid panel mid-April -If LDL>70, I recommend addition of ezetimibe and/or changing atorvastatin 80mg  to rosuvastatin 40mg  daily  Follow Up Plan: 4 weeks  Lenna Gilford, PharmD, DPLA

## 2023-04-02 ENCOUNTER — Other Ambulatory Visit: Payer: Self-pay

## 2023-04-02 DIAGNOSIS — N183 Chronic kidney disease, stage 3 unspecified: Secondary | ICD-10-CM

## 2023-04-02 MED ORDER — BLOOD PRESSURE MONITOR DEVI
0 refills | Status: DC
Start: 2023-04-02 — End: 2023-04-26

## 2023-04-02 NOTE — Progress Notes (Signed)
   04/02/2023  Patient ID: Anthony Siad Sr., male   DOB: Feb 23, 1960, 63 y.o.   MRN: [id number]  In basket request from patient's wife (DPR) stating patient's home blood pressure monitor is broken, and he is in need of a new one.  Requesting a prescription be sent to his pharmacy to see if insurance will cover.  Prescription sent to patient's pharmacy requesting automated, upper arm blood pressure monitor preferred by patient's insurance.  Inform patient's wife to let me know if monitor is not covered and purchasing over-the-counter would not be affordable-I can provide blood pressure monitor if this is the case.  Also sending proper monitoring procedure via MyChart message.  Telephone follow-up visit already scheduled with the patient for April 1.  Lenna Gilford, PharmD, DPLA

## 2023-04-04 ENCOUNTER — Telehealth: Payer: Self-pay

## 2023-04-04 NOTE — Telephone Encounter (Signed)
 Order for blood pressure monitor placed via [facility].   Copied from CRM [id number]. Topic: General - Other >> Mar 28, 2023  4:46 PM Fuller Mandril wrote: Reason for CRM: Patient wife called stated provider suggested checking blood pressure regularly. Will Need blood pressure monitor. Wanted to know if that was something that could be sent to pharmacy to see if insurance will help cover. Thank You

## 2023-04-09 ENCOUNTER — Ambulatory Visit: Payer: PRIVATE HEALTH INSURANCE | Admitting: Family Medicine

## 2023-04-11 ENCOUNTER — Ambulatory Visit: Payer: PRIVATE HEALTH INSURANCE | Admitting: Family Medicine

## 2023-04-21 ENCOUNTER — Other Ambulatory Visit: Payer: Self-pay | Admitting: Family Medicine

## 2023-04-24 ENCOUNTER — Other Ambulatory Visit: Payer: Self-pay

## 2023-04-24 NOTE — Telephone Encounter (Signed)
 Requested medication (s) are due for refill today - no  Requested medication (s) are on the active medication list -yes  Future visit scheduled -yes  Last refill: 01/08/23 #90 1RF- dose change  Notes to clinic: off protocol-provider review,  dose requested is no longer current dosing for this medication  Requested Prescriptions  Pending Prescriptions Disp Refills   RYBELSUS 7 MG TABS [Pharmacy Med Name: RYBELSUS 7 MG TABLET] 90 tablet 1    Sig: TAKE 1 TABLET (7 MG TOTAL) BY MOUTH DAILY     Off-Protocol Failed - 04/24/2023 11:52 AM      Failed - Medication not assigned to a protocol, review manually.      Passed - Valid encounter within last 12 months    Recent Outpatient Visits           1 month ago Type 2 diabetes with nephropathy (HCC)   [place] [facility], Oralia Rud, DO       Future Appointments             In 3 weeks Laural Benes, Oralia Rud, DO [place] [facility], [facility]               Requested Prescriptions  Pending Prescriptions Disp Refills   RYBELSUS 7 MG TABS [Pharmacy Med Name: RYBELSUS 7 MG TABLET] 90 tablet 1    Sig: TAKE 1 TABLET (7 MG TOTAL) BY MOUTH DAILY     Off-Protocol Failed - 04/24/2023 11:52 AM      Failed - Medication not assigned to a protocol, review manually.      Passed - Valid encounter within last 12 months    Recent Outpatient Visits           1 month ago Type 2 diabetes with nephropathy [facility])   [facility] [place], Oralia Rud, DO       Future Appointments             In 3 weeks Laural Benes, Oralia Rud, DO [place] [facility], [facility]

## 2023-04-25 ENCOUNTER — Other Ambulatory Visit: Payer: Self-pay

## 2023-04-25 DIAGNOSIS — N183 Chronic kidney disease, stage 3 unspecified: Secondary | ICD-10-CM

## 2023-04-25 NOTE — Progress Notes (Unsigned)
   04/25/2023  Patient ID: Collie Siad Sr., male   DOB: Feb 03, 1960, 63 y.o.   MRN: [id number]  Subjective/Objective  Diabetes: Current medications: Lantus 25 units daily, Rybelsus 14mg  daily -Medications tried in the past: Jardiance caused reaction leading to syncope, metformin stopped due to renal function -Patient does monitor FBG, afternoon, and bedtime BG.  Endorses FBG 120-130, and afternoon 80-90, bedtime readings 140-150 -A1c 13.5% on 1/16 -Patient recently able to pick up and resume Rybelsus 14mg  daily (copay card made this $10)   Hypertension: Current medications: amlodipine 10mg  daily, hydralazine 125mg  TID, irbesartan 300mg  daily, metoprolol tart 25mg  BID -Last OV reading 2/25 elevated at 166/90 -Patient recently requested new prescription for BP monitor, b/c home monitor quit working.  Insurance does not cover, and patient was not able to pick up.  Hyperlipidemia: Current lipid lowering medications: atorvastatin 80mg  daily  Antiplatelet regimen: ASA 81mg  daily  -LDL elevated at 112 and TC 248 in 12/2022   ASCVD History: N/A Family History: N/A Risk Factors: male sex, metabolic syndrome   PREVENT Risk Score: 10 year risk of CVD: 63.1 - 10 year risk of ASCVD: 39.1 - 10 year risk of HF: 52.8  Assessment/Plan  Diabetes: -Currently uncontrolled based on most recent A1c, but home BG readings are reflecting improved control -Continue current regimen at this time and regular monitoring/recording of home BG -Sees PCP again 4/25 and due for f/u A1c- if >7%, consider increasing basal insulin or adding bolus 5 units TID with meals or sulfonylurea    Hypertension: -Currently uncontrolled -Placing order for BP monitor under [facility] standing order and leaving monitor at [facility] for patient to receive at upcoming visit with Dr. Laural Benes -Monitor and record BP at least 3x/wk -If BP consistently >130/80, I recommend increasing metoprolol if HR can tolerate  Hyperlipidemia -Currently  uncontrolled.  -Recommend follow-up lipid panel at upcoming visit -If LDL>70, I recommend addition of ezetimibe and/or changing atorvastatin 80mg  to rosuvastatin 40mg    Follow-up:  5/22  Lenna Gilford, PharmD, DPLA

## 2023-04-26 MED ORDER — BLOOD PRESSURE MONITOR DEVI
Status: AC
Start: 2023-04-26 — End: ?

## 2023-05-02 NOTE — Progress Notes (Signed)
   05/02/2023  Patient ID: Anthony Siad Sr., male   DOB: 15-Sep-1960, 63 y.o.   MRN: [id number]  Leaving [employer] 3 series upper arm, automated BP monitor at front desk for patient to pick up at his upcoming visit with Dr. Laural Benes (scheduled for visit 4/25 and 4/28).  Sending patient MyChart message to remind this will be here for him.  Advising patient to monitor and record/save value once daily about 1 hour after taking morning medications.  Telephone follow-up scheduled with me 5/22.  Lenna Gilford, PharmD, DPLA

## 2023-05-18 ENCOUNTER — Encounter: Payer: Self-pay | Admitting: Family Medicine

## 2023-05-18 ENCOUNTER — Ambulatory Visit: Payer: PRIVATE HEALTH INSURANCE | Admitting: Family Medicine

## 2023-05-18 VITALS — BP 132/88 | HR 75 | Ht 71.0 in | Wt 235.0 lb

## 2023-05-18 DIAGNOSIS — E1110 Type 2 diabetes mellitus with ketoacidosis without coma: Secondary | ICD-10-CM

## 2023-05-18 DIAGNOSIS — E1121 Type 2 diabetes mellitus with diabetic nephropathy: Secondary | ICD-10-CM | POA: Diagnosis not present

## 2023-05-18 LAB — BAYER DCA HB A1C WAIVED: HB A1C (BAYER DCA - WAIVED): 7.3 % — ABNORMAL HIGH (ref 4.8–5.6)

## 2023-05-18 MED ORDER — AMLODIPINE BESYLATE 10 MG PO TABS: 10.0000 mg | ORAL_TABLET | Freq: Every day | ORAL | 0 refills | Status: DC

## 2023-05-18 MED ORDER — ACCU-CHEK FASTCLIX LANCETS MISC
1.0000 | Freq: Four times a day (QID) | 12 refills | Status: AC
Start: 2023-05-18 — End: ?

## 2023-05-18 MED ORDER — RYBELSUS 14 MG PO TABS: 14.0000 mg | ORAL_TABLET | Freq: Every day | ORAL | 0 refills | Status: DC

## 2023-05-18 MED ORDER — IRBESARTAN 300 MG PO TABS: 300.0000 mg | ORAL_TABLET | Freq: Every day | ORAL | 0 refills | Status: DC

## 2023-05-18 MED ORDER — ALLOPURINOL 100 MG PO TABS: 100.0000 mg | ORAL_TABLET | Freq: Every day | ORAL | 0 refills | Status: DC

## 2023-05-18 MED ORDER — HYDRALAZINE HCL 25 MG PO TABS: 25.0000 mg | ORAL_TABLET | Freq: Three times a day (TID) | ORAL | 0 refills | Status: DC

## 2023-05-18 MED ORDER — METOPROLOL TARTRATE 25 MG PO TABS: 25.0000 mg | ORAL_TABLET | Freq: Two times a day (BID) | ORAL | 0 refills | Status: DC

## 2023-05-18 MED ORDER — LANTUS SOLOSTAR 100 UNIT/ML ~~LOC~~ SOPN: 25.0000 [IU] | PEN_INJECTOR | Freq: Every day | SUBCUTANEOUS | 1 refills | Status: DC

## 2023-05-18 MED ORDER — ATORVASTATIN CALCIUM 80 MG PO TABS: 80.0000 mg | ORAL_TABLET | Freq: Every day | ORAL | 0 refills | Status: DC

## 2023-05-18 NOTE — Progress Notes (Signed)
 BP 132/88   Pulse 75   Ht 5\' 11"  (1.803 m)   Wt 235 lb (106.6 kg)   SpO2 97%   BMI 32.78 kg/m    Subjective:    Patient ID: Anthony Eland., male    DOB: 04-17-1960, 63 y.o.   MRN: [id number]  HPI: Anthony Mccaskey. is a 63 y.o. male  Chief Complaint  Patient presents with   Diabetes   DIABETES Hypoglycemic episodes:no Polydipsia/polyuria: no Visual disturbance: no Chest pain: no Paresthesias: no Glucose Monitoring: yes  Accucheck frequency: BID Taking Insulin ?: yes Blood Pressure Monitoring: not checking Retinal Examination: Not up to Date Foot Exam: Not up to Date Diabetic Education: Completed Pneumovax: Not up to Date Influenza: Not up to Date Aspirin : yes  Relevant past medical, surgical, family and social history reviewed and updated as indicated. Interim medical history since our last visit reviewed. Allergies and medications reviewed and updated.  Review of Systems  Constitutional: Negative.   Respiratory: Negative.    Cardiovascular: Negative.   Musculoskeletal: Negative.   Neurological: Negative.   Psychiatric/Behavioral: Negative.      Per HPI unless specifically indicated above     Objective:    BP 132/88   Pulse 75   Ht 5\' 11"  (1.803 m)   Wt 235 lb (106.6 kg)   SpO2 97%   BMI 32.78 kg/m   Wt Readings from Last 3 Encounters:  05/18/23 235 lb (106.6 kg)  03/20/23 212 lb 3.2 oz (96.3 kg)  02/08/23 214 lb 3.2 oz (97.2 kg)    Physical Exam Vitals and nursing note reviewed.  Constitutional:      General: He is not in acute distress.    Appearance: Normal appearance. He is obese. He is not ill-appearing, toxic-appearing or diaphoretic.  HENT:     Head: Normocephalic and atraumatic.     Right Ear: External ear normal.     Left Ear: External ear normal.     Nose: Nose normal.     Mouth/Throat:     Mouth: Mucous membranes are moist.     Pharynx: Oropharynx is clear.  Eyes:     General: No scleral icterus.       Right eye: No  discharge.        Left eye: No discharge.     Extraocular Movements: Extraocular movements intact.     Conjunctiva/sclera: Conjunctivae normal.     Pupils: Pupils are equal, round, and reactive to light.  Cardiovascular:     Rate and Rhythm: Normal rate and regular rhythm.     Pulses: Normal pulses.     Heart sounds: Normal heart sounds. No murmur heard.    No friction rub. No gallop.  Pulmonary:     Effort: Pulmonary effort is normal. No respiratory distress.     Breath sounds: Normal breath sounds. No stridor. No wheezing, rhonchi or rales.  Chest:     Chest wall: No tenderness.  Musculoskeletal:        General: Normal range of motion.     Cervical back: Normal range of motion and neck supple.  Skin:    General: Skin is warm and dry.     Capillary Refill: Capillary refill takes less than 2 seconds.     Coloration: Skin is not jaundiced or pale.     Findings: No bruising, erythema, lesion or rash.  Neurological:     General: No focal deficit present.     Mental Status: He is alert and  oriented to person, place, and time. Mental status is at baseline.  Psychiatric:        Mood and Affect: Mood normal.        Behavior: Behavior normal.        Thought Content: Thought content normal.        Judgment: Judgment normal.     Results for orders placed or performed in visit on 05/18/23  Bayer DCA Hb A1c Waived   Collection Time: 05/18/23 11:08 AM  Result Value Ref Range   HB A1C (BAYER DCA - WAIVED) 7.3 (H) 4.8 - 5.6 %      Assessment & Plan:   Problem List Items Addressed This Visit       Endocrine   Type 2 diabetes with nephropathy (HCC) - Primary   Significantly better with A1c of 7.3 down from 13.5. Continue current regimen. Continue to monitor. Call with any concerns.       Relevant Medications   Semaglutide  (RYBELSUS ) 14 MG TABS   irbesartan  (AVAPRO ) 300 MG tablet   atorvastatin  (LIPITOR) 80 MG tablet   insulin  glargine (LANTUS  SOLOSTAR) 100 UNIT/ML Solostar Pen    Other Relevant Orders   Bayer DCA Hb A1c Waived (Completed)   Other Visit Diagnoses       DM (diabetes mellitus) type 2, uncontrolled, with ketoacidosis (HCC)       Relevant Medications   Accu-Chek FastClix Lancets MISC   Semaglutide  (RYBELSUS ) 14 MG TABS   irbesartan  (AVAPRO ) 300 MG tablet   atorvastatin  (LIPITOR) 80 MG tablet   insulin  glargine (LANTUS  SOLOSTAR) 100 UNIT/ML Solostar Pen        Follow up plan: Return in about 3 months (around 08/17/2023) for OK to cancel appt on Monday.

## 2023-05-18 NOTE — Assessment & Plan Note (Signed)
 Significantly better with A1c of 7.3 down from 13.5. Continue current regimen. Continue to monitor. Call with any concerns.

## 2023-05-20 ENCOUNTER — Other Ambulatory Visit: Payer: Self-pay | Admitting: Family Medicine

## 2023-05-21 ENCOUNTER — Ambulatory Visit: Payer: PRIVATE HEALTH INSURANCE | Admitting: Family Medicine

## 2023-05-22 NOTE — Telephone Encounter (Signed)
 Unable to refill per protocol, Rx expired discontinued 01/08/23. Requested Prescriptions  Pending Prescriptions Disp Refills   RYBELSUS  7 MG TABS [Pharmacy Med Name: RYBELSUS  7 MG TABLET] 90 tablet 1    Sig: TAKE 1 TABLET (7 MG TOTAL) BY MOUTH DAILY     Off-Protocol Failed - 05/22/2023 11:16 AM      Failed - Medication not assigned to a protocol, review manually.      Passed - Valid encounter within last 12 months    Recent Outpatient Visits           4 days ago Type 2 diabetes with nephropathy [facility])   [place] [facility] [place], [place] P, DO   2 months ago Type 2 diabetes with nephropathy [facility])   [place] [facility] [place], [name], DO

## 2023-06-14 ENCOUNTER — Other Ambulatory Visit: Payer: PRIVATE HEALTH INSURANCE

## 2023-06-14 NOTE — Progress Notes (Signed)
   06/14/2023  Patient ID: Anthony Ligas Sr., male   DOB: 01/07/1961, 63 y.o.   MRN: [id number]  Subjective/Objective Telephone visit to follow-up on management of chronic disease states   Diabetes: Current medications: Lantus  25 units daily, Rybelsus  14mg  daily -Medications tried in the past: Jardiance  caused reaction leading to syncope, metformin  stopped due to renal function -Patient does monitor FBG, afternoon, and bedtime BG.  Endorses FBG 80-125, and 2 hr post-prandial around 130 -A1c 7.3% on 4/25, down from 13.5% on 1/16- congratulated patient on this major improvement -Does not endorse any s/sx of hypoglycemia or hyperglycemia   Hypertension: Current medications: amlodipine  10mg  daily, hydralazine  125mg  TID, irbesartan  300mg  daily, metoprolol  tart 25mg  BID -Last OV reading 132/88 on 4/25, down from 166/9 on 2/25  -Patient did not get BP monitor I left for him at [facility] to pick up at his last PCP visit   Hyperlipidemia: Current lipid lowering medications: atorvastatin  80mg  daily  Antiplatelet regimen: ASA 81mg  daily  -LDL elevated at 112 and TC 248 in 12/2022   ASCVD History: N/A Family History: N/A Risk Factors: male sex, metabolic syndrome   PREVENT Risk Score: 10 year risk of CVD: 63.1 - 10 year risk of ASCVD: 39.1 - 10 year risk of HF: 76.8   Assessment/Plan   Diabetes: -Currently uncontrolled with A1c >7%, but this has improved drastically -Continue current regimen at this time and regular monitoring/recording of home BG -Sees PCP again 7/25 and due for f/u A1c   Hypertension: -Currently moderately controlled now -Continue current regimen -I will call patient when I am at [facility] 6/4, so he can come by to pick up BP monitor -Begin monitoring home BP at least 3x/week and recording value   Hyperlipidemia -Currently uncontrolled.  -Recommend follow-up lipid panel at next visit -If LDL>70, I recommend addition of ezetimibe and/or changing atorvastatin  80mg  to  rosuvastatin 40mg     Follow-up:  6/4   Linn Rich, PharmD, DPLA

## 2023-06-23 ENCOUNTER — Other Ambulatory Visit: Payer: Self-pay | Admitting: Family Medicine

## 2023-06-24 ENCOUNTER — Other Ambulatory Visit: Payer: Self-pay | Admitting: Family Medicine

## 2023-06-25 NOTE — Telephone Encounter (Signed)
 The original prescription was discontinued on 01/08/2023 by Terre Ferri P, DO.   Requested Prescriptions  Pending Prescriptions Disp Refills   RYBELSUS  7 MG TABS [Pharmacy Med Name: RYBELSUS  7 MG TABLET] 90 tablet 1    Sig: TAKE 1 TABLET (7 MG TOTAL) BY MOUTH DAILY     Off-Protocol Failed - 06/25/2023  3:10 PM      Failed - Medication not assigned to a protocol, review manually.      Passed - Valid encounter within last 12 months    Recent Outpatient Visits           1 month ago Type 2 diabetes with nephropathy [facility])   [place] [facility] [place], [name], DO   3 months ago Type 2 diabetes with nephropathy [facility])   [place] [facility] [place], [name], DO

## 2023-06-25 NOTE — Telephone Encounter (Signed)
 Requested Prescriptions  Pending Prescriptions Disp Refills   hydrALAZINE  (APRESOLINE ) 100 MG tablet [Pharmacy Med Name: HYDRALAZINE  100 MG TABLET] 270 tablet 0    Sig: TAKE 1 TABLET BY MOUTH 3 TIMES DAILY.     Cardiovascular:  Vasodilators Failed - 06/25/2023  4:12 PM      Failed - ANA Screen, Ifa, Serum in normal range and within 360 days    No results found for: "ANA", "ANATITER", "LABANTI"       Passed - HCT in normal range and within 360 days    Hematocrit  Date Value Ref Range Status  01/08/2023 41.7 37.5 - 51.0 % Final         Passed - HGB in normal range and within 360 days    Hemoglobin  Date Value Ref Range Status  01/08/2023 13.4 13.0 - 17.7 g/dL Final         Passed - RBC in normal range and within 360 days    RBC  Date Value Ref Range Status  01/08/2023 4.95 4.14 - 5.80 x10E6/uL Final  02/15/2018 4.44 4.22 - 5.81 MIL/uL Final         Passed - WBC in normal range and within 360 days    WBC  Date Value Ref Range Status  01/08/2023 8.2 3.4 - 10.8 x10E3/uL Final  02/15/2018 6.0 4.0 - 10.5 K/uL Final         Passed - PLT in normal range and within 360 days    Platelets  Date Value Ref Range Status  01/08/2023 259 150 - 450 x10E3/uL Final         Passed - Last BP in normal range    BP Readings from Last 1 Encounters:  05/18/23 132/88         Passed - Valid encounter within last 12 months    Recent Outpatient Visits           1 month ago Type 2 diabetes with nephropathy [facility])   [place] [facility] [place], [name], DO   3 months ago Type 2 diabetes with nephropathy [facility])   [facility] [place], [place], DO

## 2023-06-26 ENCOUNTER — Other Ambulatory Visit: Payer: Self-pay | Admitting: Family Medicine

## 2023-06-27 ENCOUNTER — Telehealth: Payer: Self-pay

## 2023-06-27 NOTE — Progress Notes (Signed)
   06/27/2023  Patient ID: Anthony Ligas Sr., male   DOB: 04-29-1960, 63 y.o.   MRN: [id number]  Patient outreach to verify Anthony Sellers picked up BP monitor from [facility].  Patient's wife has picked monitor up for him.  Recommend patient monitor and record BG at least 3x/week.  I will follow-up in 2 weeks to see how home BP readings have been to determine if medication adjustment is needed.  Linn Rich, PharmD, DPLA

## 2023-06-27 NOTE — Telephone Encounter (Signed)
 Rx all- 05/18/23- 3 month supply- too early Requested Prescriptions  Pending Prescriptions Disp Refills   metoprolol  tartrate (LOPRESSOR ) 25 MG tablet [Pharmacy Med Name: METOPROLOL  TARTRATE 25 MG TAB] 180 tablet 0    Sig: TAKE 1 TABLET BY MOUTH TWICE A DAY     Cardiovascular:  Beta Blockers Passed - 06/27/2023  2:16 PM      Passed - Last BP in normal range    BP Readings from Last 1 Encounters:  05/18/23 132/88         Passed - Last Heart Rate in normal range    Pulse Readings from Last 1 Encounters:  05/18/23 75         Passed - Valid encounter within last 6 months    Recent Outpatient Visits           1 month ago Type 2 diabetes with nephropathy (HCC)   [place] [facility] [place], [name], DO   3 months ago Type 2 diabetes with nephropathy (HCC)   [place] [facility] [place], [name], DO               irbesartan  (AVAPRO ) 300 MG tablet [Pharmacy Med Name: IRBESARTAN  300 MG TABLET] 90 tablet 0    Sig: TAKE 1 TABLET BY MOUTH EVERY DAY     Cardiovascular:  Angiotensin Receptor Blockers Failed - 06/27/2023  2:16 PM      Failed - Cr in normal range and within 180 days    Creatinine, Ser  Date Value Ref Range Status  01/08/2023 2.59 (H) 0.76 - 1.27 mg/dL Final         Passed - K in normal range and within 180 days    Potassium  Date Value Ref Range Status  01/08/2023 4.2 3.5 - 5.2 mmol/L Final         Passed - Patient is not pregnant      Passed - Last BP in normal range    BP Readings from Last 1 Encounters:  05/18/23 132/88         Passed - Valid encounter within last 6 months    Recent Outpatient Visits           1 month ago Type 2 diabetes with nephropathy (HCC)   [place] [facility] [place], [name], DO   3 months ago Type 2 diabetes with nephropathy (HCC)   [place] [facility] [place], [name], DO               amLODipine  (NORVASC ) 10 MG tablet [Pharmacy Med Name: AMLODIPINE   BESYLATE 10 MG TAB] 90 tablet 0    Sig: TAKE 1 TABLET BY MOUTH EVERY DAY     Cardiovascular: Calcium  Channel Blockers 2 Passed - 06/27/2023  2:16 PM      Passed - Last BP in normal range    BP Readings from Last 1 Encounters:  05/18/23 132/88         Passed - Last Heart Rate in normal range    Pulse Readings from Last 1 Encounters:  05/18/23 75         Passed - Valid encounter within last 6 months    Recent Outpatient Visits           1 month ago Type 2 diabetes with nephropathy (HCC)   [place] [facility] [place], [name], DO   3 months ago Type 2 diabetes with nephropathy (HCC)     [facility] [place], [place], [place]

## 2023-07-11 ENCOUNTER — Other Ambulatory Visit: Payer: PRIVATE HEALTH INSURANCE

## 2023-07-11 NOTE — Progress Notes (Signed)
   07/11/2023  Patient ID: Bartholome Ligas Sr., male   DOB: 11-28-1960, 63 y.o.   MRN: [id number]  Subjective/Objective Telephone visit to follow-up on management of chronic disease states   Diabetes: Current medications: Lantus  25 units daily, Rybelsus  14mg  daily -Medications tried in the past: Jardiance  caused reaction leading to syncope, metformin  stopped due to renal function -Patient does monitor FBG, afternoon, and bedtime BG.  Endorses FBG 80-125, and 2 hr post-prandial around 130 -A1c 7.3% on 4/25, down from 13.5% on 1/16 -Does not endorse any s/sx of hypoglycemia or hyperglycemia   Hypertension: Current medications: amlodipine  10mg  daily, hydralazine  125mg  TID, irbesartan  300mg  daily, metoprolol  tart 25mg  BID -Last OV reading 132/88 on 4/25, down from 166/9 on 2/25  -Patient did receive [employer] automated, upper arm Bp monitor left at [facility] for him, but he has not yet checked any home BP readings   Hyperlipidemia: Current lipid lowering medications: atorvastatin  80mg  daily  Antiplatelet regimen: ASA 81mg  daily  -LDL elevated at 112 and TC 248 in 12/2022   ASCVD History: N/A Family History: N/A Risk Factors: male sex, metabolic syndrome   PREVENT Risk Score: 10 year risk of CVD: 63.1 - 10 year risk of ASCVD: 39.1 - 10 year risk of HF: 38.8   Assessment/Plan   Diabetes: -Currently uncontrolled with A1c >7%, but this has improved drastically -Continue current regimen at this time and regular monitoring/recording of home BG -Sees PCP again 7/25 and due for f/u A1c; if >7%, consider 10% increase in Lantus  to 28 units   Hypertension: -Currently moderately controlled -Continue current regimen -Suggested patient begin monitoring home Bp throughout the week and bring readings into July visit with Dr. Lincoln Renshaw -If BP consistently >130/80, could consider increasing metoprolol  (based on HR) or addition of thiazide diuretic.  If thiazide diuretic added, I recommend CMP in 2 weeks.    Hyperlipidemia -Currently uncontrolled.  -Recommend follow-up lipid panel at next visit -If LDL>70, I recommend addition of ezetimibe and/or changing atorvastatin  80mg  to rosuvastatin 40mg     Follow-up:  2 months   Linn Rich, PharmD, DPLA

## 2023-07-14 ENCOUNTER — Other Ambulatory Visit: Payer: Self-pay | Admitting: Family Medicine

## 2023-07-17 NOTE — Telephone Encounter (Signed)
 Requested Prescriptions  Refused Prescriptions Disp Refills   metoprolol  tartrate (LOPRESSOR ) 25 MG tablet [Pharmacy Med Name: METOPROLOL  TARTRATE 25 MG TAB] 180 tablet 0    Sig: TAKE 1 TABLET BY MOUTH TWICE A DAY     Cardiovascular:  Beta Blockers Passed - 07/17/2023 12:29 PM      Passed - Last BP in normal range    BP Readings from Last 1 Encounters:  05/18/23 132/88         Passed - Last Heart Rate in normal range    Pulse Readings from Last 1 Encounters:  05/18/23 75         Passed - Valid encounter within last 6 months    Recent Outpatient Visits           2 months ago Type 2 diabetes with nephropathy (HCC)   [place] [facility] [place], [name], DO   3 months ago Type 2 diabetes with nephropathy (HCC)   [place] [place], [name], DO               irbesartan  (AVAPRO ) 300 MG tablet [Pharmacy Med Name: IRBESARTAN  300 MG TABLET] 90 tablet 0    Sig: TAKE 1 TABLET BY MOUTH EVERY DAY     Cardiovascular:  Angiotensin Receptor Blockers Failed - 07/17/2023 12:29 PM      Failed - Cr in normal range and within 180 days    Creatinine, Ser  Date Value Ref Range Status  01/08/2023 2.59 (H) 0.76 - 1.27 mg/dL Final         Failed - K in normal range and within 180 days    Potassium  Date Value Ref Range Status  01/08/2023 4.2 3.5 - 5.2 mmol/L Final         Passed - Patient is not pregnant      Passed - Last BP in normal range    BP Readings from Last 1 Encounters:  05/18/23 132/88         Passed - Valid encounter within last 6 months    Recent Outpatient Visits           2 months ago Type 2 diabetes with nephropathy (HCC)   [place] [facility] [place], [name], DO   3 months ago Type 2 diabetes with nephropathy (HCC)   [place] [facility] [place], [name], DO               hydrochlorothiazide  (HYDRODIURIL ) 25 MG tablet [Pharmacy Med Name: HYDROCHLOROTHIAZIDE  25 MG TAB] 90 tablet 1     Sig: TAKE 1 TABLET (25 MG TOTAL) BY MOUTH DAILY.     Cardiovascular: Diuretics - Thiazide Failed - 07/17/2023 12:29 PM      Failed - Cr in normal range and within 180 days    Creatinine, Ser  Date Value Ref Range Status  01/08/2023 2.59 (H) 0.76 - 1.27 mg/dL Final         Failed - K in normal range and within 180 days    Potassium  Date Value Ref Range Status  01/08/2023 4.2 3.5 - 5.2 mmol/L Final         Failed - Na in normal range and within 180 days    Sodium  Date Value Ref Range Status  01/08/2023 136 134 - 144 mmol/L Final         Passed - Last BP in normal range    BP Readings from Last 1  Encounters:  05/18/23 132/88         Passed - Valid encounter within last 6 months    Recent Outpatient Visits           2 months ago Type 2 diabetes with nephropathy (HCC)   [place] [facility] [place], [name], DO   3 months ago Type 2 diabetes with nephropathy (HCC)   [place] [facility], [name], DO               RYBELSUS  7 MG TABS [Pharmacy Med Name: RYBELSUS  7 MG TABLET] 90 tablet 1    Sig: TAKE 1 TABLET (7 MG TOTAL) BY MOUTH DAILY     Off-Protocol Failed - 07/17/2023 12:29 PM      Failed - Medication not assigned to a protocol, review manually.      Passed - Valid encounter within last 12 months    Recent Outpatient Visits           2 months ago Type 2 diabetes with nephropathy (HCC)   [place] [facility] [place], [name], DO   3 months ago Type 2 diabetes with neph[facility])   [place] [facility], [name], DO               amLODipine  (NORVASC ) 10 MG tablet [Pharmacy Med Name: AMLODIPINE  BESYLATE 10 MG TAB] 90 tablet 0    Sig: TAKE 1 TABLET BY MOUTH EVERY DAY     Cardiovascular: Calcium  Channel Blockers 2 Passed - 07/17/2023 12:29 PM      Passed - Last BP in normal range    BP Readings from Last 1 Encounters:  05/18/23 132/88         Passed - Last Heart Rate  in normal range    Pulse Readings from Last 1 Encounters:  05/18/23 75         Passed - Valid encounter within last 6 months    Recent Outpatient Visits           2 months ago Type 2 diabetes with nephropathy [facility])   [place] [facility] [place], [name], DO   3 months ago Type 2 diabetes with nephropathy [facility])   [place] [facility] [place], [place], DO

## 2023-07-26 ENCOUNTER — Other Ambulatory Visit: Payer: Self-pay | Admitting: Family Medicine

## 2023-07-30 NOTE — Telephone Encounter (Signed)
 Requested Prescriptions  Pending Prescriptions Disp Refills   levocetirizine (XYZAL ) 5 MG tablet [Pharmacy Med Name: LEVOCETIRIZINE 5 MG TABLET] 90 tablet 1    Sig: TAKE 1 TABLET BY MOUTH EVERY DAY IN THE EVENING     Ear, Nose, and Throat:  Antihistamines - levocetirizine dihydrochloride  Failed - 07/30/2023  4:01 PM      Failed - Cr in normal range and within 360 days    Creatinine, Ser  Date Value Ref Range Status  01/08/2023 2.59 (H) 0.76 - 1.27 mg/dL Final         Passed - eGFR is 10 or above and within 360 days    GFR calc Af Amer  Date Value Ref Range Status  02/16/2020 38 (L) >59 mL/min/1.73 Final    Comment:    **In accordance with recommendations from the NKF-ASN Task force,**   [facility] is in the process of updating its eGFR calculation to the   2021 CKD-EPI creatinine equation that estimates kidney function   without a race variable.    GFR calc non Af Amer  Date Value Ref Range Status  02/16/2020 33 (L) >59 mL/min/1.73 Final   eGFR  Date Value Ref Range Status  01/08/2023 27 (L) >59 mL/min/1.73 Final         Passed - Valid encounter within last 12 months    Recent Outpatient Visits           2 months ago Type 2 diabetes with nephropathy (HCC)   [place] [facility] [place], [name], DO   4 months ago Type 2 diabetes with nephropathy (HCC)   [place] [facility], [name], DO               hydrALAZINE  (APRESOLINE ) 25 MG tablet [Pharmacy Med Name: HYDRALAZINE  25 MG TABLET] 270 tablet 1    Sig: TAKE 1 TABLET BY MOUTH 3 X A DAY WITH 1 100MG  HYDRALAZINE  3X A DAY FOR 125MG  THREE TIMES A DAY     Cardiovascular:  Vasodilators Failed - 07/30/2023  4:01 PM      Failed - ANA Screen, Ifa, Serum in normal range and within 360 days    No results found for: ANA, ANATITER, LABANTI       Passed - HCT in normal range and within 360 days    Hematocrit  Date Value Ref Range Status  01/08/2023 41.7 37.5 - 51.0 % Final          Passed - HGB in normal range and within 360 days    Hemoglobin  Date Value Ref Range Status  01/08/2023 13.4 13.0 - 17.7 g/dL Final         Passed - RBC in normal range and within 360 days    RBC  Date Value Ref Range Status  01/08/2023 4.95 4.14 - 5.80 x10E6/uL Final  02/15/2018 4.44 4.22 - 5.81 MIL/uL Final         Passed - WBC in normal range and within 360 days    WBC  Date Value Ref Range Status  01/08/2023 8.2 3.4 - 10.8 x10E3/uL Final  02/15/2018 6.0 4.0 - 10.5 K/uL Final         Passed - PLT in normal range and within 360 days    Platelets  Date Value Ref Range Status  01/08/2023 259 150 - 450 x10E3/uL Final         Passed - Last BP in normal range    BP Readings from  Last 1 Encounters:  05/18/23 132/88         Passed - Valid encounter within last 12 months    Recent Outpatient Visits           2 months ago Type 2 diabetes with nephropathy [facility])   [place] [facility] [place], [name], DO   4 months ago Type 2 diabetes with nephropathy [facility])   [place] [facility] [place], [name], DO

## 2023-08-12 ENCOUNTER — Other Ambulatory Visit: Payer: Self-pay | Admitting: Family Medicine

## 2023-08-14 NOTE — Telephone Encounter (Signed)
 Requested Prescriptions  Pending Prescriptions Disp Refills   irbesartan  (AVAPRO ) 300 MG tablet [Pharmacy Med Name: IRBESARTAN  300 MG TABLET] 90 tablet 0    Sig: TAKE 1 TABLET BY MOUTH EVERY DAY     Cardiovascular:  Angiotensin Receptor Blockers Failed - 08/14/2023  2:25 PM      Failed - Cr in normal range and within 180 days    Creatinine, Ser  Date Value Ref Range Status  01/08/2023 2.59 (H) 0.76 - 1.27 mg/dL Final         Failed - K in normal range and within 180 days    Potassium  Date Value Ref Range Status  01/08/2023 4.2 3.5 - 5.2 mmol/L Final         Passed - Patient is not pregnant      Passed - Last BP in normal range    BP Readings from Last 1 Encounters:  05/18/23 132/88         Passed - Valid encounter within last 6 months    Recent Outpatient Visits           2 months ago Type 2 diabetes with nephropathy (HCC)   [facility] [place], [name], DO   4 months ago Type 2 diabetes with nephropathy (HCC)   [place] [facility], [name], DO               RYBELSUS  7 MG TABS [Pharmacy Med Name: RYBELSUS  7 MG TABLET] 90 tablet 1    Sig: TAKE 1 TABLET (7 MG TOTAL) BY MOUTH DAILY     Off-Protocol Failed - 08/14/2023  2:25 PM      Failed - Medication not assigned to a protocol, review manually.      Passed - Valid encounter within last 12 months    Recent Outpatient Visits           2 months ago Type 2 diabetes with nephropathy (HCC)   [place] [facility] [place], [name], DO   4 months ago Type 2 diabetes with nephropathy [facility])   [place] [facility] [place], [name], DO               amLODipine  (NORVASC ) 10 MG tablet [Pharmacy Med Name: AMLODIPINE  BESYLATE 10 MG TAB] 90 tablet 0    Sig: TAKE 1 TABLET BY MOUTH EVERY DAY     Cardiovascular: Calcium  Channel Blockers 2 Passed - 08/14/2023  2:25 PM      Passed - Last BP in normal range    BP Readings from Last 1 Encounters:   05/18/23 132/88         Passed - Last Heart Rate in normal range    Pulse Readings from Last 1 Encounters:  05/18/23 75         Passed - Valid encounter within last 6 months    Recent Outpatient Visits           2 months ago Type 2 diabetes with nephropathy (HCC)   [place] [facility] [place], [name], DO   4 months ago Type 2 diabetes with nephropathy (HCC)    [facility] [place], [name], DO               hydrochlorothiazide  (HYDRODIURIL ) 25 MG tablet [Pharmacy Med Name: HYDROCHLOROTHIAZIDE  25 MG TAB] 90 tablet 1    Sig: TAKE 1 TABLET (25 MG TOTAL) BY MOUTH DAILY.  Cardiovascular: Diuretics - Thiazide Failed - 08/14/2023  2:25 PM      Failed - Cr in normal range and within 180 days    Creatinine, Ser  Date Value Ref Range Status  01/08/2023 2.59 (H) 0.76 - 1.27 mg/dL Final         Failed - K in normal range and within 180 days    Potassium  Date Value Ref Range Status  01/08/2023 4.2 3.5 - 5.2 mmol/L Final         Failed - Na in normal range and within 180 days    Sodium  Date Value Ref Range Status  01/08/2023 136 134 - 144 mmol/L Final         Passed - Last BP in normal range    BP Readings from Last 1 Encounters:  05/18/23 132/88         Passed - Valid encounter within last 6 months    Recent Outpatient Visits           2 months ago Type 2 diabetes with nephropathy (HCC)   [place] [facility] [place], [name], DO   4 months ago Type 2 diabetes with nephropathy (HCC)   [place] [facility] [place], [name], DO               metoprolol  tartrate (LOPRESSOR ) 25 MG tablet [Pharmacy Med Name: METOPROLOL  TARTRATE 25 MG TAB] 180 tablet 0    Sig: TAKE 1 TABLET BY MOUTH TWICE A DAY     Cardiovascular:  Beta Blockers Passed - 08/14/2023  2:25 PM      Passed - Last BP in normal range    BP Readings from Last 1 Encounters:  05/18/23 132/88         Passed - Last Heart Rate  in normal range    Pulse Readings from Last 1 Encounters:  05/18/23 75         Passed - Valid encounter within last 6 months    Recent Outpatient Visits           2 months ago Type 2 diabetes with nephropathy (HCC)   [facility] [place], [name], DO   4 months ago Type 2 diabetes with nephropathy [facility])   [facility] [place], [place], DO

## 2023-08-17 ENCOUNTER — Ambulatory Visit: Payer: PRIVATE HEALTH INSURANCE | Admitting: Family Medicine

## 2023-08-30 ENCOUNTER — Other Ambulatory Visit: Payer: Self-pay | Admitting: Family Medicine

## 2023-09-01 NOTE — Telephone Encounter (Signed)
 Hydrochlorothiazide  discontinued on 07/10/22, Rybelsus  7 mg discontinued on 01/08/23 and dosage increased, will refuse these requests.   Requested Prescriptions  Pending Prescriptions Disp Refills   hydrochlorothiazide  (HYDRODIURIL ) 25 MG tablet [Pharmacy Med Name: HYDROCHLOROTHIAZIDE  25 MG TAB] 90 tablet 1    Sig: TAKE 1 TABLET (25 MG TOTAL) BY MOUTH DAILY.     There is no refill protocol information for this order     RYBELSUS  7 MG TABS [Pharmacy Med Name: RYBELSUS  7 MG TABLET] 90 tablet 1    Sig: TAKE 1 TABLET (7 MG TOTAL) BY MOUTH DAILY     There is no refill protocol information for this order

## 2023-09-10 ENCOUNTER — Other Ambulatory Visit: Payer: PRIVATE HEALTH INSURANCE

## 2023-09-10 DIAGNOSIS — E1121 Type 2 diabetes mellitus with diabetic nephropathy: Secondary | ICD-10-CM

## 2023-09-10 NOTE — Progress Notes (Signed)
   09/10/2023  Patient ID: Anthony JULIANNA [name] Sr., male   DOB: May 28, 1960, 63 y.o.   MRN: [id number]  Subjective/Objective Telephone visit to follow-up on management of chronic disease states   Diabetes: Current medications: Lantus  25 units daily, Rybelsus  14mg  daily -Medications tried in the past: Jardiance  caused reaction leading to syncope, metformin  stopped due to renal function -Patient does monitor FBG, afternoon, and bedtime BG.  Endorses FBG 80-125, and 2 hr post-prandial around 130 -A1c 7.3% on 4/25, down from 13.5% on 1/16 -Does endorse s/sx of hypoglycemia on rare occasion if he does not eat consistently throughout the day   Hypertension: Current medications: amlodipine  10mg  daily, hydralazine  125mg  TID, irbesartan  300mg  daily, metoprolol  tart 25mg  BID -Last OV reading 132/88 on 4/25, down from 166/9 on 2/25  -Patient did receive [employer] automated, upper arm Bp monitor left at [facility] for him, but he has not checked any home BP readings recently   Hyperlipidemia: Current lipid lowering medications: atorvastatin  80mg  daily  Antiplatelet regimen: ASA 81mg  daily  -LDL elevated at 112 and TC 248 in 12/2022   ASCVD History: N/A Family History: N/A Risk Factors: male sex, metabolic syndrome   PREVENT Risk Score: 10 year risk of CVD: 63.1 - 10 year risk of ASCVD: 39.1 - 10 year risk of HF: 23.8   Assessment/Plan   Diabetes: -Currently uncontrolled with A1c >7%, but this has improved drastically -Continue current regimen at this time and regular monitoring/recording of home BG -Sees PCP again 9/9 and due for f/u A1c; if >7%, consider 10% increase in Lantus  to 28 units   Hypertension: -Currently moderately controlled -Continue current regimen -Suggested patient begin monitoring home BP regularly -If BP consistently >130/80, could consider increasing metoprolol  (based on HR) or addition of thiazide diuretic.  If thiazide diuretic added, I recommend CMP in 2 weeks.    Hyperlipidemia -Currently uncontrolled.  -Recommend follow-up lipid panel at next visit -If LDL>70, I recommend addition of ezetimibe and/or changing atorvastatin  80mg  to rosuvastatin 40mg     Follow-up:  10/14   Anthony Sellers, PharmD, DPLA

## 2023-09-19 ENCOUNTER — Other Ambulatory Visit: Payer: Self-pay | Admitting: Family Medicine

## 2023-09-20 NOTE — Telephone Encounter (Signed)
 Requested Prescriptions  Refused Prescriptions Disp Refills   hydrochlorothiazide  (HYDRODIURIL ) 25 MG tablet [Pharmacy Med Name: HYDROCHLOROTHIAZIDE  25 MG TAB] 90 tablet 1    Sig: TAKE 1 TABLET (25 MG TOTAL) BY MOUTH DAILY.     Cardiovascular: Diuretics - Thiazide Failed - 09/20/2023  3:36 PM      Failed - Cr in normal range and within 180 days    Creatinine, Ser  Date Value Ref Range Status  01/08/2023 2.59 (H) 0.76 - 1.27 mg/dL Final         Failed - K in normal range and within 180 days    Potassium  Date Value Ref Range Status  01/08/2023 4.2 3.5 - 5.2 mmol/L Final         Failed - Na in normal range and within 180 days    Sodium  Date Value Ref Range Status  01/08/2023 136 134 - 144 mmol/L Final         Passed - Last BP in normal range    BP Readings from Last 1 Encounters:  05/18/23 132/88         Passed - Valid encounter within last 6 months    Recent Outpatient Visits           4 months ago Type 2 diabetes with nephropathy (HCC)   [place] [facility] [place], [name], DO   6 months ago Type 2 diabetes with nephropathy (HCC)   [place] [facility], [name], DO               RYBELSUS  7 MG TABS [Pharmacy Med Name: RYBELSUS  7 MG TABLET] 90 tablet 1    Sig: TAKE 1 TABLET (7 MG TOTAL) BY MOUTH DAILY     Off-Protocol Failed - 09/20/2023  3:36 PM      Failed - Medication not assigned to a protocol, review manually.      Passed - Valid encounter within last 12 months    Recent Outpatient Visits           4 months ago Type 2 diabetes with nephropathy [facility])   [place] [facility] [place], [name], DO   6 months ago Type 2 diabetes with nephropathy [facility])   [place] [facility] [place], [name], DO

## 2023-09-29 ENCOUNTER — Other Ambulatory Visit: Payer: Self-pay | Admitting: Family Medicine

## 2023-10-01 NOTE — Telephone Encounter (Signed)
 Requested Prescriptions  Pending Prescriptions Disp Refills   amLODipine  (NORVASC ) 10 MG tablet [Pharmacy Med Name: AMLODIPINE  BESYLATE 10 MG TAB] 90 tablet 0    Sig: TAKE 1 TABLET BY MOUTH EVERY DAY     Cardiovascular: Calcium  Channel Blockers 2 Passed - 10/01/2023 12:09 PM      Passed - Last BP in normal range    BP Readings from Last 1 Encounters:  05/18/23 132/88         Passed - Last Heart Rate in normal range    Pulse Readings from Last 1 Encounters:  05/18/23 75         Passed - Valid encounter within last 6 months    Recent Outpatient Visits           4 months ago Type 2 diabetes with nephropathy (HCC)   [place] [facility] [place], [name], DO   6 months ago Type 2 diabetes with nephropathy (HCC)   [place] [facility] [place], [name], DO               metoprolol  tartrate (LOPRESSOR ) 25 MG tablet [Pharmacy Med Name: METOPROLOL  TARTRATE 25 MG TAB] 180 tablet 0    Sig: TAKE 1 TABLET BY MOUTH TWICE A DAY     Cardiovascular:  Beta Blockers Passed - 10/01/2023 12:09 PM      Passed - Last BP in normal range    BP Readings from Last 1 Encounters:  05/18/23 132/88         Passed - Last Heart Rate in normal range    Pulse Readings from Last 1 Encounters:  05/18/23 75         Passed - Valid encounter within last 6 months    Recent Outpatient Visits           4 months ago Type 2 diabetes with nephropathy (HCC)   [facility] [place], [name], DO   6 months ago Type 2 diabetes with nephropathy [facility])   [place] [facility] [place], [name], DO               LANTUS  SOLOSTAR 100 UNIT/ML Solostar Pen [Pharmacy Med Name: LANTUS  SOLOSTAR 100 UNIT/ML]  1    Sig: INJECT 25 UNITS INTO THE SKIN DAILY.     Endocrinology:  Diabetes - Insulins Passed - 10/01/2023 12:09 PM      Passed - HBA1C is between 0 and 7.9 and within 180 days    HB A1C (BAYER DCA - WAIVED)  Date Value Ref Range Status   05/18/2023 7.3 (H) 4.8 - 5.6 % Final    Comment:             Prediabetes: 5.7 - 6.4          Diabetes: >6.4          Glycemic control for adults with diabetes: <7.0          Passed - Valid encounter within last 6 months    Recent Outpatient Visits           4 months ago Type 2 diabetes with nephropathy [facility])   [place] [facility] [place], [name], DO   6 months ago Type 2 diabetes with nephropathy [facility])    [facility] [place], [name], DO               irbesartan  (AVAPRO ) 300 MG tablet [Pharmacy Med Name: IRBESARTAN  300  MG TABLET] 90 tablet 0    Sig: TAKE 1 TABLET BY MOUTH EVERY DAY     Cardiovascular:  Angiotensin Receptor Blockers Failed - 10/01/2023 12:09 PM      Failed - Cr in normal range and within 180 days    Creatinine, Ser  Date Value Ref Range Status  01/08/2023 2.59 (H) 0.76 - 1.27 mg/dL Final         Failed - K in normal range and within 180 days    Potassium  Date Value Ref Range Status  01/08/2023 4.2 3.5 - 5.2 mmol/L Final         Passed - Patient is not pregnant      Passed - Last BP in normal range    BP Readings from Last 1 Encounters:  05/18/23 132/88         Passed - Valid encounter within last 6 months    Recent Outpatient Visits           4 months ago Type 2 diabetes with nephropathy [facility])   [place] [facility] [place], [name], DO   6 months ago Type 2 diabetes with nephropathy [facility])   [place] [facility] [place], [place], DO

## 2023-10-01 NOTE — Telephone Encounter (Signed)
 Requested medications are due for refill today.  Unsure  Requested medications are on the active medications list.  yes  Last refill. 05/18/2023 15mL 1 rf  Future visit scheduled.   Yes - tomorrow  Notes to clinic.  Rx written to expire 09/10/2023 - rx is expired.    Requested Prescriptions  Pending Prescriptions Disp Refills   LANTUS  SOLOSTAR 100 UNIT/ML Solostar Pen [Pharmacy Med Name: LANTUS  SOLOSTAR 100 UNIT/ML]  1    Sig: INJECT 25 UNITS INTO THE SKIN DAILY.     Endocrinology:  Diabetes - Insulins Passed - 10/01/2023 12:10 PM      Passed - HBA1C is between 0 and 7.9 and within 180 days    HB A1C (BAYER DCA - WAIVED)  Date Value Ref Range Status  05/18/2023 7.3 (H) 4.8 - 5.6 % Final    Comment:             Prediabetes: 5.7 - 6.4          Diabetes: >6.4          Glycemic control for adults with diabetes: <7.0          Passed - Valid encounter within last 6 months    Recent Outpatient Visits           4 months ago Type 2 diabetes with nephropathy (HCC)   [facility] [place], [name], DO   6 months ago Type 2 diabetes with nephropathy [facility])   [place] [facility] [place], [name], DO              Signed Prescriptions Disp Refills   amLODipine  (NORVASC ) 10 MG tablet 90 tablet 0    Sig: TAKE 1 TABLET BY MOUTH EVERY DAY     Cardiovascular: Calcium  Channel Blockers 2 Passed - 10/01/2023 12:10 PM      Passed - Last BP in normal range    BP Readings from Last 1 Encounters:  05/18/23 132/88         Passed - Last Heart Rate in normal range    Pulse Readings from Last 1 Encounters:  05/18/23 75         Passed - Valid encounter within last 6 months    Recent Outpatient Visits           4 months ago Type 2 diabetes with nephropathy (HCC)   [place] [facility] [place], [name], DO   6 months ago Type 2 diabetes with nephropathy (HCC)   [place] [facility] [place], [name], DO                metoprolol  tartrate (LOPRESSOR ) 25 MG tablet 180 tablet 0    Sig: TAKE 1 TABLET BY MOUTH TWICE A DAY     Cardiovascular:  Beta Blockers Passed - 10/01/2023 12:10 PM      Passed - Last BP in normal range    BP Readings from Last 1 Encounters:  05/18/23 132/88         Passed - Last Heart Rate in normal range    Pulse Readings from Last 1 Encounters:  05/18/23 75         Passed - Valid encounter within last 6 months    Recent Outpatient Visits           4 months ago Type 2 diabetes with nephropathy [facility])   [place] [facility] [place], [name], DO   6 months  ago Type 2 diabetes with nephropathy (HCC)   [facility] [place], [name], DO               irbesartan  (AVAPRO ) 300 MG tablet 90 tablet 0    Sig: TAKE 1 TABLET BY MOUTH EVERY DAY     Cardiovascular:  Angiotensin Receptor Blockers Failed - 10/01/2023 12:10 PM      Failed - Cr in normal range and within 180 days    Creatinine, Ser  Date Value Ref Range Status  01/08/2023 2.59 (H) 0.76 - 1.27 mg/dL Final         Failed - K in normal range and within 180 days    Potassium  Date Value Ref Range Status  01/08/2023 4.2 3.5 - 5.2 mmol/L Final         Passed - Patient is not pregnant      Passed - Last BP in normal range    BP Readings from Last 1 Encounters:  05/18/23 132/88         Passed - Valid encounter within last 6 months    Recent Outpatient Visits           4 months ago Type 2 diabetes with nephropathy [facility])   [place] [facility] [place], [name], DO   6 months ago Type 2 diabetes with nephropathy [facility])   [facility] [place], [place], DO

## 2023-10-02 ENCOUNTER — Encounter: Payer: Self-pay | Admitting: Family Medicine

## 2023-10-02 ENCOUNTER — Other Ambulatory Visit: Payer: Self-pay

## 2023-10-02 ENCOUNTER — Encounter: Payer: Self-pay | Admitting: Pharmacist

## 2023-10-02 ENCOUNTER — Ambulatory Visit (INDEPENDENT_AMBULATORY_CARE_PROVIDER_SITE_OTHER): Payer: PRIVATE HEALTH INSURANCE | Admitting: Family Medicine

## 2023-10-02 VITALS — BP 152/94 | HR 81 | Temp 98.0°F | Ht 71.0 in | Wt 235.8 lb

## 2023-10-02 DIAGNOSIS — E1121 Type 2 diabetes mellitus with diabetic nephropathy: Secondary | ICD-10-CM | POA: Diagnosis not present

## 2023-10-02 DIAGNOSIS — E1122 Type 2 diabetes mellitus with diabetic chronic kidney disease: Secondary | ICD-10-CM | POA: Diagnosis not present

## 2023-10-02 DIAGNOSIS — E559 Vitamin D deficiency, unspecified: Secondary | ICD-10-CM

## 2023-10-02 DIAGNOSIS — E782 Mixed hyperlipidemia: Secondary | ICD-10-CM

## 2023-10-02 DIAGNOSIS — I1310 Hypertensive heart and chronic kidney disease without heart failure, with stage 1 through stage 4 chronic kidney disease, or unspecified chronic kidney disease: Secondary | ICD-10-CM

## 2023-10-02 DIAGNOSIS — M10371 Gout due to renal impairment, right ankle and foot: Secondary | ICD-10-CM

## 2023-10-02 DIAGNOSIS — N183 Chronic kidney disease, stage 3 unspecified: Secondary | ICD-10-CM

## 2023-10-02 LAB — BAYER DCA HB A1C WAIVED: HB A1C (BAYER DCA - WAIVED): 6.4 % — ABNORMAL HIGH (ref 4.8–5.6)

## 2023-10-02 MED ORDER — AMLODIPINE BESYLATE 10 MG PO TABS: 10.0000 mg | ORAL_TABLET | Freq: Every day | ORAL | 1 refills | Status: AC

## 2023-10-02 MED ORDER — HYDRALAZINE HCL 25 MG PO TABS: ORAL_TABLET | ORAL | 1 refills | Status: AC

## 2023-10-02 MED ORDER — IRBESARTAN 300 MG PO TABS: 300.0000 mg | ORAL_TABLET | Freq: Every day | ORAL | 1 refills | Status: AC

## 2023-10-02 MED ORDER — METOPROLOL TARTRATE 25 MG PO TABS: 25.0000 mg | ORAL_TABLET | Freq: Two times a day (BID) | ORAL | 1 refills | Status: AC

## 2023-10-02 MED ORDER — TIRZEPATIDE 10 MG/0.5ML ~~LOC~~ SOAJ
10.0000 mg | SUBCUTANEOUS | 0 refills | Status: AC
  Filled 2023-10-02 – 2023-10-17 (×2): qty 2, 28d supply, fill #0

## 2023-10-02 MED ORDER — ASPIRIN 81 MG PO TBEC: 81.0000 mg | DELAYED_RELEASE_TABLET | Freq: Every day | ORAL | 3 refills | Status: AC

## 2023-10-02 MED ORDER — ALLOPURINOL 100 MG PO TABS: 100.0000 mg | ORAL_TABLET | Freq: Every day | ORAL | 1 refills | Status: AC

## 2023-10-02 MED ORDER — ATORVASTATIN CALCIUM 80 MG PO TABS: 80.0000 mg | ORAL_TABLET | Freq: Every day | ORAL | 1 refills | Status: AC

## 2023-10-02 MED ORDER — HYDRALAZINE HCL 100 MG PO TABS: ORAL_TABLET | ORAL | 1 refills | Status: AC

## 2023-10-02 NOTE — Assessment & Plan Note (Signed)
 Rechecking labs today. Await results. Treat as needed.

## 2023-10-02 NOTE — Assessment & Plan Note (Signed)
 Running high. Will hold on changing his medication for blood pressure at this time. Will change from rybelsus  to mounjaro  and see if weight loss helps with BP. Recheck in 6 weeks.

## 2023-10-02 NOTE — Assessment & Plan Note (Signed)
 Doing great with A1c of 6.4. Would like to come off insulin . Will change rybelsus  to mounjaro  and push dose to 10mg  with foal of titrating up- stop insulin . Recheck 6 weeks. Call with any concerns.

## 2023-10-02 NOTE — Progress Notes (Signed)
 BP (!) 152/94   Pulse 81   Temp 98 F (36.7 C)   Ht 5' 11 (1.803 m)   Wt 235 lb 12.8 oz (107 kg)   SpO2 95%   BMI 32.89 kg/m    Subjective:    Patient ID: Anthony Sellers., male    DOB: 02-10-1960, 63 y.o.   MRN: [id number]  HPI: Anthony Sellers. is a 63 y.o. male  Chief Complaint  Patient presents with   Diabetes    3 mon f/u for DM Check    DIABETES Hypoglycemic episodes:no Polydipsia/polyuria: no Visual disturbance: no Chest pain: no Paresthesias: no Glucose Monitoring: no  Accucheck frequency: Not Checking Taking Insulin ?: yes Blood Pressure Monitoring: not checking Retinal Examination: Not up to Date Foot Exam: Up to Date Diabetic Education: Completed Pneumovax: Not up to Date Influenza: Not up to Date Aspirin : yes  No gout flares. Tolerating his allopurinol  well.  HYPERTENSION / HYPERLIPIDEMIA Satisfied with current treatment? yes Duration of hypertension: chronic BP monitoring frequency: not checking BP range:  BP medication side effects: no Past BP meds: amlodipine , hydralazine , metoprolol , irbesartan  Duration of hyperlipidemia: chronic Cholesterol medication side effects: no Cholesterol supplements: none Past cholesterol medications: atorvastatin  Medication compliance: excellent compliance Aspirin : yes Recent stressors: no Recurrent headaches: no Visual changes: no Palpitations: no Dyspnea: no Chest pain: no Lower extremity edema: no Dizzy/lightheaded: no  Relevant past medical, surgical, family and social history reviewed and updated as indicated. Interim medical history since our last visit reviewed. Allergies and medications reviewed and updated.  Review of Systems  Constitutional: Negative.   Respiratory: Negative.    Cardiovascular: Negative.   Musculoskeletal: Negative.   Neurological: Negative.   Psychiatric/Behavioral: Negative.      Per HPI unless specifically indicated above     Objective:    BP (!) 152/94    Pulse 81   Temp 98 F (36.7 C)   Ht 5' 11 (1.803 m)   Wt 235 lb 12.8 oz (107 kg)   SpO2 95%   BMI 32.89 kg/m   Wt Readings from Last 3 Encounters:  10/02/23 235 lb 12.8 oz (107 kg)  05/18/23 235 lb (106.6 kg)  03/20/23 212 lb 3.2 oz (96.3 kg)    Physical Exam Vitals and nursing note reviewed.  Constitutional:      General: He is not in acute distress.    Appearance: Normal appearance. He is obese. He is not ill-appearing, toxic-appearing or diaphoretic.  HENT:     Head: Normocephalic and atraumatic.     Right Ear: External ear normal.     Left Ear: External ear normal.     Nose: Nose normal.     Mouth/Throat:     Mouth: Mucous membranes are moist.     Pharynx: Oropharynx is clear.  Eyes:     General: No scleral icterus.       Right eye: No discharge.        Left eye: No discharge.     Extraocular Movements: Extraocular movements intact.     Conjunctiva/sclera: Conjunctivae normal.     Pupils: Pupils are equal, round, and reactive to light.  Cardiovascular:     Rate and Rhythm: Normal rate and regular rhythm.     Pulses: Normal pulses.     Heart sounds: Normal heart sounds. No murmur heard.    No friction rub. No gallop.  Pulmonary:     Effort: Pulmonary effort is normal. No respiratory distress.     Breath  sounds: Normal breath sounds. No stridor. No wheezing, rhonchi or rales.  Chest:     Chest wall: No tenderness.  Musculoskeletal:        General: Normal range of motion.     Cervical back: Normal range of motion and neck supple.  Skin:    General: Skin is warm and dry.     Capillary Refill: Capillary refill takes less than 2 seconds.     Coloration: Skin is not jaundiced or pale.     Findings: No bruising, erythema, lesion or rash.  Neurological:     General: No focal deficit present.     Mental Status: He is alert and oriented to person, place, and time. Mental status is at baseline.  Psychiatric:        Mood and Affect: Mood normal.        Behavior:  Behavior normal.        Thought Content: Thought content normal.        Judgment: Judgment normal.     Results for orders placed or performed in visit on 05/18/23  Bayer DCA Hb A1c Waived   Collection Time: 05/18/23 11:08 AM  Result Value Ref Range   HB A1C (BAYER DCA - WAIVED) 7.3 (H) 4.8 - 5.6 %      Assessment & Plan:   Problem List Items Addressed This Visit       Cardiovascular and Mediastinum   Hypertensive heart/kidney disease without HF and with CKD stage III (HCC)   Running high. Will hold on changing his medication for blood pressure at this time. Will change from rybelsus  to mounjaro  and see if weight loss helps with BP. Recheck in 6 weeks.      Relevant Medications   amLODipine  (NORVASC ) 10 MG tablet   aspirin  EC (ASPIRIN  LOW DOSE) 81 MG tablet   atorvastatin  (LIPITOR) 80 MG tablet   hydrALAZINE  (APRESOLINE ) 100 MG tablet   hydrALAZINE  (APRESOLINE ) 25 MG tablet   irbesartan  (AVAPRO ) 300 MG tablet   metoprolol  tartrate (LOPRESSOR ) 25 MG tablet   Other Relevant Orders   CBC with Differential/Platelet   Comprehensive metabolic panel with GFR     Endocrine   Type 2 diabetes with nephropathy (HCC) - Primary   Doing great with A1c of 6.4. Would like to come off insulin . Will change rybelsus  to mounjaro  and push dose to 10mg  with foal of titrating up- stop insulin . Recheck 6 weeks. Call with any concerns.       Relevant Medications   aspirin  EC (ASPIRIN  LOW DOSE) 81 MG tablet   atorvastatin  (LIPITOR) 80 MG tablet   irbesartan  (AVAPRO ) 300 MG tablet   tirzepatide  (MOUNJARO ) 10 MG/0.5ML Pen   Other Relevant Orders   Bayer DCA Hb A1c Waived   CBC with Differential/Platelet   Comprehensive metabolic panel with GFR     Musculoskeletal and Integument   Acute gout due to renal impairment involving toe of right foot   Under good control on current regimen. Continue current regimen. Continue to monitor. Call with any concerns. Refills given. Labs drawn today.        Relevant Medications   allopurinol  (ZYLOPRIM ) 100 MG tablet   aspirin  EC (ASPIRIN  LOW DOSE) 81 MG tablet   Other Relevant Orders   CBC with Differential/Platelet   Comprehensive metabolic panel with GFR   Uric acid     Other   Hyperlipidemia   Under good control on current regimen. Continue current regimen. Continue to monitor. Call with any concerns.  Refills given. Labs drawn today.        Relevant Medications   amLODipine  (NORVASC ) 10 MG tablet   aspirin  EC (ASPIRIN  LOW DOSE) 81 MG tablet   atorvastatin  (LIPITOR) 80 MG tablet   hydrALAZINE  (APRESOLINE ) 100 MG tablet   hydrALAZINE  (APRESOLINE ) 25 MG tablet   irbesartan  (AVAPRO ) 300 MG tablet   metoprolol  tartrate (LOPRESSOR ) 25 MG tablet   Other Relevant Orders   CBC with Differential/Platelet   Comprehensive metabolic panel with GFR   Lipid Panel w/o Chol/HDL Ratio   Vitamin D  deficiency   Rechecking labs today. Await results. Treat as needed.       Relevant Orders   CBC with Differential/Platelet   Comprehensive metabolic panel with GFR   VITAMIN D  25 Hydroxy (Vit-D Deficiency, Fractures)     Follow up plan: Return in about 6 weeks (around 11/13/2023).

## 2023-10-02 NOTE — Assessment & Plan Note (Signed)
 Under good control on current regimen. Continue current regimen. Continue to monitor. Call with any concerns. Refills given. Labs drawn today.

## 2023-10-02 NOTE — Patient Instructions (Addendum)
 STOP INSULIN  STOP RYBELSUS   START MOUNJARO  SHOT 1x a week (at [facility])  Come back and see me in 6 weeks

## 2023-10-03 LAB — CBC WITH DIFFERENTIAL/PLATELET
Basophils Absolute: 0 x10E3/uL (ref 0.0–0.2)
Basos: 1 %
EOS (ABSOLUTE): 0.4 x10E3/uL (ref 0.0–0.4)
Eos: 5 %
Hematocrit: 37.9 % (ref 37.5–51.0)
Hemoglobin: 12 g/dL — ABNORMAL LOW (ref 13.0–17.7)
Immature Grans (Abs): 0 x10E3/uL (ref 0.0–0.1)
Immature Granulocytes: 0 %
Lymphocytes Absolute: 1.2 x10E3/uL (ref 0.7–3.1)
Lymphs: 16 %
MCH: 27.5 pg (ref 26.6–33.0)
MCHC: 31.7 g/dL (ref 31.5–35.7)
MCV: 87 fL (ref 79–97)
Monocytes Absolute: 0.6 x10E3/uL (ref 0.1–0.9)
Monocytes: 8 %
Neutrophils Absolute: 5.4 x10E3/uL (ref 1.4–7.0)
Neutrophils: 69 %
Platelets: 228 x10E3/uL (ref 150–450)
RBC: 4.37 x10E6/uL (ref 4.14–5.80)
RDW: 14.4 % (ref 11.6–15.4)
WBC: 7.8 x10E3/uL (ref 3.4–10.8)

## 2023-10-03 LAB — COMPREHENSIVE METABOLIC PANEL WITH GFR
ALT: 34 IU/L (ref 0–44)
AST: 21 IU/L (ref 0–40)
Albumin: 3.8 g/dL — ABNORMAL LOW (ref 3.9–4.9)
Alkaline Phosphatase: 107 IU/L (ref 44–121)
BUN/Creatinine Ratio: 16 (ref 10–24)
BUN: 44 mg/dL — ABNORMAL HIGH (ref 8–27)
Bilirubin Total: 0.2 mg/dL (ref 0.0–1.2)
CO2: 18 mmol/L — ABNORMAL LOW (ref 20–29)
Calcium: 8.9 mg/dL (ref 8.6–10.2)
Chloride: 108 mmol/L — ABNORMAL HIGH (ref 96–106)
Creatinine, Ser: 2.81 mg/dL — ABNORMAL HIGH (ref 0.76–1.27)
Globulin, Total: 2.1 g/dL (ref 1.5–4.5)
Glucose: 101 mg/dL — ABNORMAL HIGH (ref 70–99)
Potassium: 4.2 mmol/L (ref 3.5–5.2)
Sodium: 143 mmol/L (ref 134–144)
Total Protein: 5.9 g/dL — ABNORMAL LOW (ref 6.0–8.5)
eGFR: 25 mL/min/1.73 — ABNORMAL LOW (ref >59)

## 2023-10-03 LAB — LIPID PANEL W/O CHOL/HDL RATIO
Cholesterol, Total: 170 mg/dL (ref 100–199)
HDL: 35 mg/dL — ABNORMAL LOW (ref >39)
LDL Chol Calc (NIH): 98 mg/dL (ref 0–99)
Triglycerides: 214 mg/dL — ABNORMAL HIGH (ref 0–149)
VLDL Cholesterol Cal: 37 mg/dL (ref 5–40)

## 2023-10-03 LAB — VITAMIN D 25 HYDROXY (VIT D DEFICIENCY, FRACTURES): Vit D, 25-Hydroxy: 26.3 ng/mL — ABNORMAL LOW (ref 30.0–100.0)

## 2023-10-03 LAB — URIC ACID: Uric Acid: 8.1 mg/dL (ref 3.8–8.4)

## 2023-10-10 ENCOUNTER — Ambulatory Visit: Payer: Self-pay | Admitting: Family Medicine

## 2023-10-15 ENCOUNTER — Other Ambulatory Visit: Payer: Self-pay

## 2023-10-17 ENCOUNTER — Other Ambulatory Visit: Payer: Self-pay

## 2023-10-18 ENCOUNTER — Other Ambulatory Visit: Payer: Self-pay

## 2023-10-19 ENCOUNTER — Telehealth: Payer: Self-pay

## 2023-10-19 NOTE — Telephone Encounter (Signed)
 Copied from CRM #[id number]. Topic: Clinical - Medication Question >> Oct 19, 2023  8:08 AM Anthony Sellers wrote: Reason for CRM: Wife is calling for her husband and had a few questions about taking tirzepatide  (MOUNJARO ) 10 MG/0.5ML Pen [[id number]]. She's not sure how many days before to stop taking his insulin  and rybelsus . Pt is still on those medications because the mounjaro  shot was not able to be filled until the 24th. Please advise [phone]

## 2023-10-22 NOTE — Telephone Encounter (Signed)
 He can take his insulin  and rybelsus  until the day before he starts his mounjaro . He should not take them together, but does not need to titrate off the insulin  or rybelsus 

## 2023-10-22 NOTE — Telephone Encounter (Signed)
 Yes. He is stopping both the insulin  and the rybelsus  and should be taking his mounjaro  instead.

## 2023-11-01 ENCOUNTER — Other Ambulatory Visit: Payer: Self-pay | Admitting: Family Medicine

## 2023-11-02 NOTE — Telephone Encounter (Signed)
 Requested Prescriptions  Refused Prescriptions Disp Refills   LANTUS  SOLOSTAR 100 UNIT/ML Solostar Pen [Pharmacy Med Name: LANTUS  SOLOSTAR 100 UNIT/ML]  1    Sig: INJECT 25 UNITS INTO THE SKIN DAILY.     Endocrinology:  Diabetes - Insulins Passed - 11/02/2023  4:39 PM      Passed - HBA1C is between 0 and 7.9 and within 180 days    HB A1C (BAYER DCA - WAIVED)  Date Value Ref Range Status  10/02/2023 6.4 (H) 4.8 - 5.6 % Final    Comment:             Prediabetes: 5.7 - 6.4          Diabetes: >6.4          Glycemic control for adults with diabetes: <7.0          Passed - Valid encounter within last 6 months    Recent Outpatient Visits           1 month ago Type 2 diabetes with nephropathy [facility])   [place] [facility] [place], [name], DO   5 months ago Type 2 diabetes with nephropathy [facility])   [place] [facility] [place], [name], DO   7 months ago Type 2 diabetes with nephropathy [facility] [place])   [place] [facility] [place], [place], DO

## 2023-11-06 ENCOUNTER — Other Ambulatory Visit: Payer: PRIVATE HEALTH INSURANCE

## 2023-11-06 NOTE — Progress Notes (Unsigned)
   11/06/2023  Patient ID: Anthony JULIANNA [name] Sr., male   DOB: Jan 08, 1961, 63 y.o.   MRN: [id number]  Outreach attempt for scheduled telephone follow-up visit was not successful.  Tried to call patient x2, but did not get an answer; and voicemail has not been set up.  Sending patient a MyChart message to attempt to reschedule visit, but will call him if I do not hear back in another 1-2 weeks.  Anthony Sellers, PharmD, DPLA

## 2023-11-13 ENCOUNTER — Ambulatory Visit: Payer: PRIVATE HEALTH INSURANCE | Admitting: Family Medicine

## 2023-11-26 ENCOUNTER — Other Ambulatory Visit: Payer: Self-pay

## 2023-11-26 ENCOUNTER — Ambulatory Visit: Payer: Self-pay

## 2023-11-26 DIAGNOSIS — E1121 Type 2 diabetes mellitus with diabetic nephropathy: Secondary | ICD-10-CM

## 2023-11-26 DIAGNOSIS — I1310 Hypertensive heart and chronic kidney disease without heart failure, with stage 1 through stage 4 chronic kidney disease, or unspecified chronic kidney disease: Secondary | ICD-10-CM

## 2023-11-26 DIAGNOSIS — E782 Mixed hyperlipidemia: Secondary | ICD-10-CM

## 2023-11-26 MED ORDER — PEN NEEDLES 31G X 5 MM MISC: 0 refills | Status: AC

## 2023-11-26 MED ORDER — TIRZEPATIDE 7.5 MG/0.5ML ~~LOC~~ SOAJ: 7.5000 mg | SUBCUTANEOUS | 1 refills | Status: AC

## 2023-11-26 MED ORDER — LANTUS SOLOSTAR 100 UNIT/ML ~~LOC~~ SOPN: 10.0000 [IU] | PEN_INJECTOR | Freq: Every day | SUBCUTANEOUS | 1 refills | Status: AC

## 2023-11-26 NOTE — Telephone Encounter (Signed)
 This encounter was created in error - please disregard.

## 2023-11-26 NOTE — Progress Notes (Signed)
   11/26/2023  Patient ID: Anthony JULIANNA [name] Sr., male   DOB: 1960-05-08, 63 y.o.   MRN: [id number]  Mounjaro  7.5mg  weekly going through for $25; pharmacy will have this and Lantus  refill ready later today.  Patient will stop Rybelsus , start Mounjaro , and decrease Lantus  to 10 units daily.  Endorses understanding and telephone visit scheduled in 4 weeks to check on tolerance/efficacy of Mounjaro .  Goal is to increase dose to be able to stop Lantus .  [name], PharmD, DPLA

## 2023-11-26 NOTE — Addendum Note (Signed)
 Addended by: DEANNA ROSELLA A on: 11/26/2023 01:47 PM   Modules accepted: Orders

## 2023-11-26 NOTE — Progress Notes (Signed)
   11/26/2023  Patient ID: Anthony JULIANNA [name] Sr., male   DOB: 1960-07-10, 63 y.o.   MRN: [id number]  Subjective/Objective Telephone visit to follow-up on management of chronic disease states   Diabetes: Current medications: Lantus  25 units daily, Rybelsus  14mg  daily -Medications tried in the past: Jardiance  caused reaction leading to syncope, metformin  stopped due to renal function -Patient does monitor FBG, afternoon, and bedtime BG.  Endorses FBG 80-130 -A1c 6.4% on 9/9 -Dr. Vicci ordered Mounjaro  10mg  weekly for patient to switch to from Rybelsus  14mg  in order to be able to also come off of insulin  -Patient has not been able to pick Mounjaro  up due to cost, so they are requesting refills for Rybelsus  14mg  and Lantus  25 units daily  -Does endorse s/sx of hypoglycemia on rare occasion if he does not eat consistently throughout the day   Hypertension: Current medications: amlodipine  10mg  daily, hydralazine  125mg  TID, irbesartan  300mg  daily, metoprolol  tart 25mg  BID -Last OV reading 152/94 on 9/9 -Dr. Vicci held off on making any BP medication changes in hopes Mounjaro  would lead to some weight loss, leading to lower BP readings -Patient did receive [employer] automated, upper arm Bp monitor left at [facility] for him, but he is not checking home BP regularly   Hyperlipidemia: Current lipid lowering medications: atorvastatin  80mg  daily  Antiplatelet regimen: ASA 81mg  daily  -LDL 98 and TC 214 on 9/9   ASCVD History: N/A Family History: N/A Risk Factors: male sex, metabolic syndrome   PREVENT Risk Score: 10 year risk of CVD: 63.1 - 10 year risk of ASCVD: 39.1 - 10 year risk of HF: 42.8   Assessment/Plan   Diabetes: -Currently controlled with A1c <7% -Test claim reflects $25 copay/month for Mounjaro .  I would recommend switching Rybelsus  14mg  daily to Mounjaro  5mg  or 7.5mg  weekly first, then increasing dose every 4 weeks as tolerated.  Coordinating with PCP to send new prescription for one of  these doses if in agreement (goal to prevent stomach upset) -Recommend patient stop Rybelsus  14mg  daily, decrease Lantus  to 10 units daily when starting Mounjaro    Hypertension: -Currently uncontrolled -Continue current regimen -Suggested patient begin monitoring home BP regularly -Goal that Mounjaro  will help with weight loss, which will lower BP -If BP consistently >130/80, could consider increasing metoprolol  (based on HR) or addition of thiazide diuretic.  If thiazide diuretic added, I recommend CMP in 2 weeks.   Hyperlipidemia -Currently uncontrolled with LDL >70 -Recommend follow-up lipid panel at next visit -If LDL>70, I recommend addition of ezetimibe and/or changing atorvastatin  80mg  to rosuvastatin 40mg     Follow-up:  Will follow-up with patient/wife on DM plan and schedule a telephone follow-up in 4 weeks to check on efficacy/tolerance of Mounjaro    [name], PharmD, DPLA

## 2023-11-26 NOTE — Telephone Encounter (Signed)
 FYI Only or Action Required?: Action required by provider: medication refill request. Needs refill of insulin  and Rybelsus  14  Patient was last seen in primary care on 10/02/2023 by Vicci Bouchard P, DO.  Called Nurse Triage reporting Medication Refill.  Symptoms began na.  Interventions attempted: Other: na.  Symptoms are: unchanged.  Triage Disposition: Call PCP Now  Patient/caregiver understands and will follow disposition?: Yes                         Reason for Triage: pt needs his insulin  and insulin  pills . They have been having trouble with getting meds please follow up with his wife nancy about this.    Reason for Disposition  [1] Prescription refill request for ESSENTIAL medicine (i.e., likelihood of harm to patient if not taken) AND [2] triager unable to refill per department policy  Answer Assessment - Initial Assessment Questions Called back and s/w wife. She states that they are unable to afford the weekly injectable medication and so pt has not started it. Pt is taking insulin  and 14 mg Rybelsus  and needs these refilled.   1. DRUG NAME: What medicine do you need to have refilled?     Insulin  and Rybelsus  2. REFILLS REMAINING: How many refills are remaining? Notes: The label on the medicine or pill bottle will show how many refills are remaining. If there are no refills remaining, then a renewal may be needed.     Pt has 1 week of Rybelsus  left. Wife does not know how much insulin  pt has. 4. PRESCRIBER: Who prescribed it? Note: The prescribing doctor or group is responsible for refill approvals..     Dr. Vicci  Protocols used: Medication Refill and Renewal Call-A-AH

## 2023-11-26 NOTE — Telephone Encounter (Signed)
 Routing to provider to advise. I see the insulin  was sent in earlier but Rybelsus  is not on current medication list. Is the patient supposed to be taking this?

## 2023-12-20 NOTE — Progress Notes (Signed)
   12/24/23  Patient ID: Anthony JULIANNA [name] Sr., male   DOB: 06-22-1960, 63 y.o.   MRN: [id number]  Outreach attempt for scheduled telephone visit was not successful, but I was able to leave a HIPAA compliant voicemail with my direct phone number.  I contacted [facility], and patient has not picked up Mounjaro  7.5mg  weekly; but this is going through for $25 copay.  Sending a MyChart message to patient in regard to picking up and starting this medication and to see if we can reschedule today's telephone visit.  [name], PharmD, DPLA

## 2023-12-24 ENCOUNTER — Other Ambulatory Visit: Payer: PRIVATE HEALTH INSURANCE

## 2023-12-27 NOTE — Progress Notes (Unsigned)
   12/28/2023  Patient ID: Anthony JULIANNA [name] Sr., male   DOB: [birth year], 63 y.o.   MRN: [id number]  Unsuccessful outreach attempt to try to follow-up with patient in regard to diabetes control.  I was not able to reach the patient or leave a voicemail on his phone, but I was able to leave a HIPAA compliant voicemail with my direct number on his wife's phone.  I will try to to contact him again in another week or two if I do not hear back.  [name], PharmD, DPLA

## 2023-12-28 ENCOUNTER — Other Ambulatory Visit: Payer: Self-pay

## 2024-01-08 NOTE — Progress Notes (Unsigned)
° °  01/09/24  Patient ID: Anthony JULIANNA [name] Sr., male   DOB: 1960/08/03, 63 y.o.   MRN: [id number]  Outreach attempt to follow-up on management of diabetes and blood pressure was not successful, and patient's voicemail has not been set up.  I will send  a MyChart message and try to follow-up again in a week or two if I do not hear back.  [name], PharmD, DPLA

## 2024-01-09 ENCOUNTER — Other Ambulatory Visit: Payer: Self-pay

## 2024-01-22 ENCOUNTER — Telehealth: Payer: Self-pay | Admitting: Pharmacy Technician

## 2024-01-22 NOTE — Progress Notes (Signed)
" ° °  01/22/2024 Name: Anthony Sellers. MRN: [id number] DOB: September 16, 1960  Patient is appearing for a follow-up visit with the population health pharmacy technician. Last engaged with the clinical pharmacist to discuss diabetes on 11/26/2023. Contacted patient today to discuss diabetes but unfortunately was unable to reach patient.  Plan from last clinical pharmacist appointment:  Diabetes: -Currently controlled with A1c <7% -Test claim reflects $25 copay/month for Mounjaro .  I would recommend switching Rybelsus  14mg  daily to Mounjaro  5mg  or 7.5mg  weekly first, then increasing dose every 4 weeks as tolerated.  Coordinating with PCP to send new prescription for one of these doses if in agreement (goal to prevent stomach upset) -Recommend patient stop Rybelsus  14mg  daily, decrease Lantus  to 10 units daily when starting Mounjaro  Hypertension: -Currently uncontrolled -Continue current regimen -Suggested patient begin monitoring home BP regularly -Goal that Mounjaro  will help with weight loss, which will lower BP -If BP consistently >130/80, could consider increasing metoprolol  (based on HR) or addition of thiazide diuretic.  If thiazide diuretic added, I recommend CMP in 2 weeks.  Hyperlipidemia -Currently uncontrolled with LDL >70 -Recommend follow-up lipid panel at next visit -If LDL>70, I recommend addition of ezetimibe and/or changing atorvastatin  80mg  to rosuvastatin 40mg   Follow-up:  Will follow-up with patient/wife on DM plan and schedule a telephone follow-up in 4 weeks to check on efficacy/tolerance of Mounjaro (copy/paste from last note)   Medication Adherence Barriers Identified:  Patient made recommended medication changes per plan: No [facility] and [facility]. Per Pharmacist, Mounjaro  7.5mg  was never picked up. It was filled but then returned to stock. Rybelsus  was last sold on 01/04/24 for 30 tablets. Access issues with any new medication or testing device: Yes  Mounjaro    Medication Adherence Barriers Addressed/Actions Taken:   Medication Access for Mounjaro  Will discuss medication access concerns with pharmacist Contacted pharmacy regarding new prescriptions. [facility]jar[facility]  [facility] but [facility] never picked it up. [facility] will [facility] to have prescription on patient profile once it is decided what dose is appropriate as patient has not had Ryblesus filled since 11/04/23 for 30 tablets.   Patient engaged with clinical pharmacist for management of diabetes on 11/26/2023. Outreach by [employer] technician was requested. Outreached patient to discuss diabetes medication management. Voicemail was unable to left left as a factory message came on stating that voicemail is not set up yet.   Next clinical pharmacist appointment is scheduled for: TBD   Kate Caddy, CPhT [facility] Office: [phone] Email: [email or web address]  "

## 2024-01-23 ENCOUNTER — Telehealth: Payer: Self-pay | Admitting: Pharmacy Technician

## 2024-01-23 NOTE — Progress Notes (Signed)
" ° °  01/23/2024  Patient ID: Anthony JULIANNA [name] Sr., male   DOB: [birth year], 63 y.o.   MRN: [id number]  Patient engaged with clinical pharmacist for management of diabetes on 11/26/2023. Outreach by [employer] technician was requested.   Outreached patient to discuss diabetes medication management. Unfortunately was not able to leave a voicemail as patient does not have voicemail set up per a factory message.  Seibert Keeter, CPhT [place] [facility] Office: [phone] Email: [email or web address] "

## 2024-01-26 ENCOUNTER — Other Ambulatory Visit: Payer: Self-pay | Admitting: Family Medicine

## 2024-01-28 NOTE — Telephone Encounter (Signed)
 Requested medication (s) are due for refill today: Yes  Requested medication (s) are on the active medication list: Yes  Last refill:  01/09/23  Future visit scheduled: Yes  Notes to clinic:  Manual review.    Requested Prescriptions  Pending Prescriptions Disp Refills   Vitamin D , Ergocalciferol , (DRISDOL ) 1.25 MG (50000 UNIT) CAPS capsule [Pharmacy Med Name: VITAMIN D2 1.25MG (50,000 UNIT)] 12 capsule 3    Sig: Take 1 capsule (50,000 Units total) by mouth every 7 (seven) days.     Endocrinology:  Vitamins - Vitamin D  Supplementation 2 Failed - 01/28/2024  3:14 PM      Failed - Manual Review: Route requests for 50,000 IU strength to the provider      Failed - Vitamin D  in normal range and within 360 days    Vit D, 25-Hydroxy  Date Value Ref Range Status  10/02/2023 26.3 (L) 30.0 - 100.0 ng/mL Final    Comment:    Vitamin D  deficiency has been defined by the Institute of Medicine and an Endocrine Society practice guideline as a level of serum 25-OH vitamin D  less than 20 ng/mL (1,2). The Endocrine Society went on to further define vitamin D  insufficiency as a level between 21 and 29 ng/mL (2). 1. IOM (Institute of Medicine). 2010. Dietary reference    intakes for calcium  and D. [place]: The    [employer]. 2. Holick MF, [place], Bischoff-Ferrari HA, et al.    Evaluation, treatment, and prevention of vitamin D     deficiency: an Endocrine Society clinical practice    guideline. JCEM. 2011 Jul; 96(7):1911-30.          Passed - Ca in normal range and within 360 days    Calcium   Date Value Ref Range Status  10/02/2023 8.9 8.6 - 10.2 mg/dL Final         Passed - Valid encounter within last 12 months    Recent Outpatient Visits           3 months ago Type 2 diabetes with nephropathy [facility])   [place] [facility] [place], [name], DO   8 months ago Type 2 diabetes with nephropathy [facility])   [place] [facility] [place],  [name], DO   10 months ago Type 2 diabetes with nephropathy [facility])   [place] [facility] [place], [name], DO

## 2024-01-29 ENCOUNTER — Telehealth: Payer: Self-pay | Admitting: Pharmacy Technician

## 2024-01-29 NOTE — Progress Notes (Addendum)
" ° °  01/29/2024  Patient ID: Anthony JULIANNA [name] Sr., male   DOB: July 16, 1960, 64 y.o.   MRN: [id number]  Patient engaged with clinical pharmacist for management of diabetes on 11/26/2023. Outreach by [employer] technician was requested.   Outreached patient to discuss diabetes medication management. Voicemail box not set up so a message was not able to be left. Will send patient MyChart message today.  Azya Barbero, CPhT [facility] Office: [phone] Email: [email or web address]    "

## 2024-02-07 ENCOUNTER — Ambulatory Visit: Payer: PRIVATE HEALTH INSURANCE | Admitting: Family Medicine

## 2024-03-05 ENCOUNTER — Other Ambulatory Visit: Payer: PRIVATE HEALTH INSURANCE
